# Patient Record
Sex: Female | Born: 1960 | Race: White | Hispanic: No | Marital: Single
Health system: Southern US, Community
[De-identification: ages and names within clinical notes are randomized; demographics above are authoritative.]

## PROBLEM LIST (undated history)

## (undated) DIAGNOSIS — U071 COVID-19: Secondary | ICD-10-CM

## (undated) DIAGNOSIS — K922 Gastrointestinal hemorrhage, unspecified: Secondary | ICD-10-CM

## (undated) DIAGNOSIS — I4891 Unspecified atrial fibrillation: Secondary | ICD-10-CM

## (undated) DIAGNOSIS — I639 Cerebral infarction, unspecified: Secondary | ICD-10-CM

## (undated) DIAGNOSIS — I2510 Atherosclerotic heart disease of native coronary artery without angina pectoris: Secondary | ICD-10-CM

## (undated) DIAGNOSIS — Z931 Gastrostomy status: Secondary | ICD-10-CM

## (undated) DIAGNOSIS — D649 Anemia, unspecified: Secondary | ICD-10-CM

## (undated) DIAGNOSIS — H547 Unspecified visual loss: Secondary | ICD-10-CM

## (undated) DIAGNOSIS — I69391 Dysphagia following cerebral infarction: Secondary | ICD-10-CM

## (undated) DIAGNOSIS — Z7901 Long term (current) use of anticoagulants: Secondary | ICD-10-CM

---

## 2021-04-27 ENCOUNTER — Other Ambulatory Visit: Payer: Self-pay

## 2021-04-27 ENCOUNTER — Observation Stay (HOSPITAL_COMMUNITY): Payer: Medicaid Other

## 2021-04-27 ENCOUNTER — Inpatient Hospital Stay (HOSPITAL_COMMUNITY)
Admission: EM | Admit: 2021-04-27 | Discharge: 2021-05-02 | DRG: 871 | Disposition: A | Discharge status: Home Health | Payer: Medicaid Other | Source: Skilled Nursing Facility | Attending: Family Medicine | Admitting: Family Medicine

## 2021-04-27 ENCOUNTER — Encounter (HOSPITAL_COMMUNITY): Payer: Self-pay | Admitting: Emergency Medicine

## 2021-04-27 ENCOUNTER — Emergency Department (HOSPITAL_COMMUNITY): Payer: Medicaid Other

## 2021-04-27 DIAGNOSIS — Z7982 Long term (current) use of aspirin: Secondary | ICD-10-CM

## 2021-04-27 DIAGNOSIS — Z79899 Other long term (current) drug therapy: Secondary | ICD-10-CM

## 2021-04-27 DIAGNOSIS — Z931 Gastrostomy status: Secondary | ICD-10-CM

## 2021-04-27 DIAGNOSIS — Z8249 Family history of ischemic heart disease and other diseases of the circulatory system: Secondary | ICD-10-CM

## 2021-04-27 DIAGNOSIS — D62 Acute posthemorrhagic anemia: Secondary | ICD-10-CM | POA: Diagnosis present

## 2021-04-27 DIAGNOSIS — R531 Weakness: Secondary | ICD-10-CM

## 2021-04-27 DIAGNOSIS — H547 Unspecified visual loss: Secondary | ICD-10-CM | POA: Diagnosis present

## 2021-04-27 DIAGNOSIS — I63413 Cerebral infarction due to embolism of bilateral middle cerebral arteries: Secondary | ICD-10-CM

## 2021-04-27 DIAGNOSIS — G9349 Other encephalopathy: Secondary | ICD-10-CM | POA: Diagnosis present

## 2021-04-27 DIAGNOSIS — E785 Hyperlipidemia, unspecified: Secondary | ICD-10-CM | POA: Diagnosis present

## 2021-04-27 DIAGNOSIS — Z8673 Personal history of transient ischemic attack (TIA), and cerebral infarction without residual deficits: Secondary | ICD-10-CM

## 2021-04-27 DIAGNOSIS — E870 Hyperosmolality and hypernatremia: Secondary | ICD-10-CM | POA: Diagnosis present

## 2021-04-27 DIAGNOSIS — I2510 Atherosclerotic heart disease of native coronary artery without angina pectoris: Secondary | ICD-10-CM | POA: Diagnosis present

## 2021-04-27 DIAGNOSIS — U071 COVID-19: Secondary | ICD-10-CM

## 2021-04-27 DIAGNOSIS — I4821 Permanent atrial fibrillation: Secondary | ICD-10-CM | POA: Diagnosis present

## 2021-04-27 DIAGNOSIS — R509 Fever, unspecified: Secondary | ICD-10-CM | POA: Diagnosis present

## 2021-04-27 DIAGNOSIS — I6381 Other cerebral infarction due to occlusion or stenosis of small artery: Secondary | ICD-10-CM | POA: Diagnosis present

## 2021-04-27 DIAGNOSIS — J9601 Acute respiratory failure with hypoxia: Secondary | ICD-10-CM | POA: Diagnosis present

## 2021-04-27 DIAGNOSIS — Z0189 Encounter for other specified special examinations: Secondary | ICD-10-CM

## 2021-04-27 DIAGNOSIS — D649 Anemia, unspecified: Secondary | ICD-10-CM

## 2021-04-27 DIAGNOSIS — R195 Other fecal abnormalities: Secondary | ICD-10-CM

## 2021-04-27 DIAGNOSIS — E860 Dehydration: Secondary | ICD-10-CM | POA: Diagnosis present

## 2021-04-27 DIAGNOSIS — K5900 Constipation, unspecified: Secondary | ICD-10-CM | POA: Diagnosis present

## 2021-04-27 DIAGNOSIS — Z951 Presence of aortocoronary bypass graft: Secondary | ICD-10-CM

## 2021-04-27 DIAGNOSIS — Z66 Do not resuscitate: Secondary | ICD-10-CM | POA: Diagnosis present

## 2021-04-27 DIAGNOSIS — Z7901 Long term (current) use of anticoagulants: Secondary | ICD-10-CM

## 2021-04-27 DIAGNOSIS — I639 Cerebral infarction, unspecified: Secondary | ICD-10-CM

## 2021-04-27 DIAGNOSIS — A4189 Other specified sepsis: Principal | ICD-10-CM | POA: Diagnosis present

## 2021-04-27 DIAGNOSIS — K922 Gastrointestinal hemorrhage, unspecified: Secondary | ICD-10-CM

## 2021-04-27 DIAGNOSIS — I69391 Dysphagia following cerebral infarction: Secondary | ICD-10-CM

## 2021-04-27 DIAGNOSIS — Z1389 Encounter for screening for other disorder: Secondary | ICD-10-CM

## 2021-04-27 DIAGNOSIS — I10 Essential (primary) hypertension: Secondary | ICD-10-CM | POA: Diagnosis present

## 2021-04-27 DIAGNOSIS — E440 Moderate protein-calorie malnutrition: Secondary | ICD-10-CM

## 2021-04-27 DIAGNOSIS — R1310 Dysphagia, unspecified: Secondary | ICD-10-CM | POA: Diagnosis present

## 2021-04-27 DIAGNOSIS — R471 Dysarthria and anarthria: Secondary | ICD-10-CM | POA: Diagnosis present

## 2021-04-27 DIAGNOSIS — R5381 Other malaise: Secondary | ICD-10-CM | POA: Diagnosis present

## 2021-04-27 HISTORY — DX: Cerebral infarction, unspecified: I63.9

## 2021-04-27 HISTORY — DX: Unspecified atrial fibrillation: I48.91

## 2021-04-27 LAB — CBC WITH DIFFERENTIAL/PLATELET
Abs Immature Granulocytes: 0.11 10*3/uL — ABNORMAL HIGH (ref 0.00–0.07)
Basophils Absolute: 0 10*3/uL (ref 0.0–0.1)
Basophils Relative: 0 %
Eosinophils Absolute: 0 10*3/uL (ref 0.0–0.5)
Eosinophils Relative: 0 %
HCT: 24.8 % — ABNORMAL LOW (ref 36.0–46.0)
Hemoglobin: 7.6 g/dL — ABNORMAL LOW (ref 12.0–15.0)
Immature Granulocytes: 1 %
Lymphocytes Relative: 9 %
Lymphs Abs: 0.9 10*3/uL (ref 0.7–4.0)
MCH: 28 pg (ref 26.0–34.0)
MCHC: 30.6 g/dL (ref 30.0–36.0)
MCV: 91.5 fL (ref 80.0–100.0)
Monocytes Absolute: 1.3 10*3/uL — ABNORMAL HIGH (ref 0.1–1.0)
Monocytes Relative: 12 %
Neutro Abs: 8.2 10*3/uL — ABNORMAL HIGH (ref 1.7–7.7)
Neutrophils Relative %: 78 %
Platelets: 230 10*3/uL (ref 150–400)
RBC: 2.71 MIL/uL — ABNORMAL LOW (ref 3.87–5.11)
RDW: 16.6 % — ABNORMAL HIGH (ref 11.5–15.5)
WBC: 10.6 10*3/uL — ABNORMAL HIGH (ref 4.0–10.5)
nRBC: 0.5 % — ABNORMAL HIGH (ref 0.0–0.2)

## 2021-04-27 LAB — URINALYSIS, MICROSCOPIC (REFLEX)

## 2021-04-27 LAB — FIBRINOGEN: Fibrinogen: 468 mg/dL (ref 210–475)

## 2021-04-27 LAB — COMPREHENSIVE METABOLIC PANEL
ALT: 22 U/L (ref 0–44)
AST: 29 U/L (ref 15–41)
Albumin: 3.2 g/dL — ABNORMAL LOW (ref 3.5–5.0)
Alkaline Phosphatase: 70 U/L (ref 38–126)
Anion gap: 10 (ref 5–15)
BUN: 32 mg/dL — ABNORMAL HIGH (ref 8–23)
CO2: 25 mmol/L (ref 22–32)
Calcium: 9.6 mg/dL (ref 8.9–10.3)
Chloride: 111 mmol/L (ref 98–111)
Creatinine, Ser: 0.96 mg/dL (ref 0.44–1.00)
GFR, Estimated: >60 mL/min (ref >60)
Glucose, Bld: 131 mg/dL — ABNORMAL HIGH (ref 70–99)
Potassium: 4.3 mmol/L (ref 3.5–5.1)
Sodium: 146 mmol/L — ABNORMAL HIGH (ref 135–145)
Total Bilirubin: 0.7 mg/dL (ref 0.3–1.2)
Total Protein: 6.9 g/dL (ref 6.5–8.1)

## 2021-04-27 LAB — URINALYSIS, ROUTINE W REFLEX MICROSCOPIC
Bilirubin Urine: NEGATIVE
Glucose, UA: NEGATIVE mg/dL
Ketones, ur: NEGATIVE mg/dL
Leukocytes,Ua: NEGATIVE
Nitrite: NEGATIVE
Protein, ur: 100 mg/dL — AB
Specific Gravity, Urine: 1.02 (ref 1.005–1.030)
pH: 7 (ref 5.0–8.0)

## 2021-04-27 LAB — FOLATE: Folate: 30.5 ng/mL (ref >5.9)

## 2021-04-27 LAB — IRON AND TIBC
Iron: 30 ug/dL (ref 28–170)
Saturation Ratios: 8 % — ABNORMAL LOW (ref 10.4–31.8)
TIBC: 371 ug/dL (ref 250–450)
UIBC: 341 ug/dL

## 2021-04-27 LAB — HIV ANTIBODY (ROUTINE TESTING W REFLEX): HIV Screen 4th Generation wRfx: NONREACTIVE

## 2021-04-27 LAB — LACTATE DEHYDROGENASE: LDH: 290 U/L — ABNORMAL HIGH (ref 98–192)

## 2021-04-27 LAB — PROTIME-INR
INR: 1.1 (ref 0.8–1.2)
Prothrombin Time: 14.2 seconds (ref 11.4–15.2)

## 2021-04-27 LAB — POC OCCULT BLOOD, ED: Fecal Occult Bld: POSITIVE — AB

## 2021-04-27 LAB — VITAMIN B12: Vitamin B-12: 271 pg/mL (ref 180–914)

## 2021-04-27 LAB — RETICULOCYTES
Immature Retic Fract: 33.1 % — ABNORMAL HIGH (ref 2.3–15.9)
RBC.: 2.66 MIL/uL — ABNORMAL LOW (ref 3.87–5.11)
Retic Count, Absolute: 151.9 10*3/uL (ref 19.0–186.0)
Retic Ct Pct: 5.7 % — ABNORMAL HIGH (ref 0.4–3.1)

## 2021-04-27 LAB — RESP PANEL BY RT-PCR (FLU A&B, COVID) ARPGX2
Influenza A by PCR: NEGATIVE
Influenza B by PCR: NEGATIVE
SARS Coronavirus 2 by RT PCR: POSITIVE — AB

## 2021-04-27 LAB — PROCALCITONIN: Procalcitonin: <0.1 ng/mL

## 2021-04-27 LAB — FERRITIN: Ferritin: 18 ng/mL (ref 11–307)

## 2021-04-27 LAB — HEMOGLOBIN AND HEMATOCRIT, BLOOD
HCT: 21.8 % — ABNORMAL LOW (ref 36.0–46.0)
Hemoglobin: 6.6 g/dL — CL (ref 12.0–15.0)

## 2021-04-27 LAB — C-REACTIVE PROTEIN: CRP: 0.6 mg/dL (ref <1.0)

## 2021-04-27 LAB — LACTIC ACID, PLASMA: Lactic Acid, Venous: 1.3 mmol/L (ref 0.5–1.9)

## 2021-04-27 LAB — ABO/RH: ABO/RH(D): O POS

## 2021-04-27 LAB — PREPARE RBC (CROSSMATCH)

## 2021-04-27 IMAGING — CT CT HEAD W/O CM
3 of 5 series · 14 of 47 positions shown, 16 images · non-contrast
Comparison: None.

CLINICAL DATA: Altered mental status

EXAM:
CT HEAD WITHOUT CONTRAST
TECHNIQUE: Contiguous axial images were obtained from the base of the skull
through the vertex without intravenous contrast.

[Series 3: head without · axial · non-contrast · 0.44mm/px · z∈[-139,-79]mm · 3 of 32 slices shown]
[im 7/32  brain]
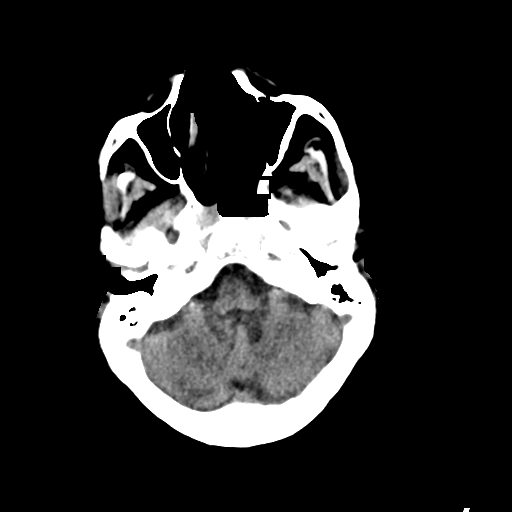
[im 13/32  brain]
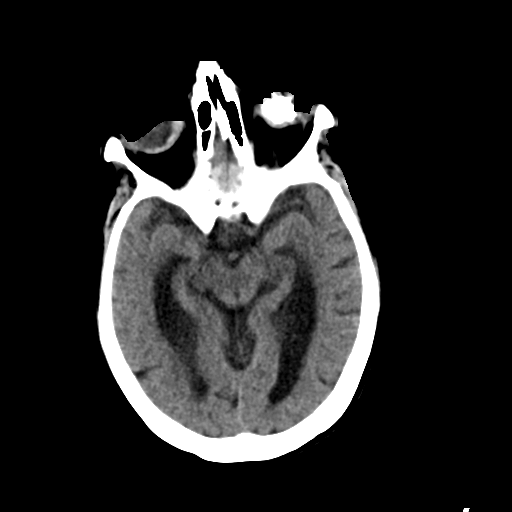
[im 19/32  brain]
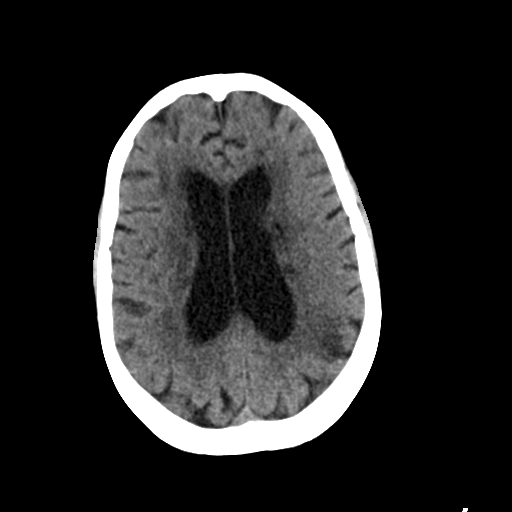

[Series 5: head without cor · coronal · non-contrast · 0.36mm/px · 3 of 73 slices shown]
[im 27/73  brain]
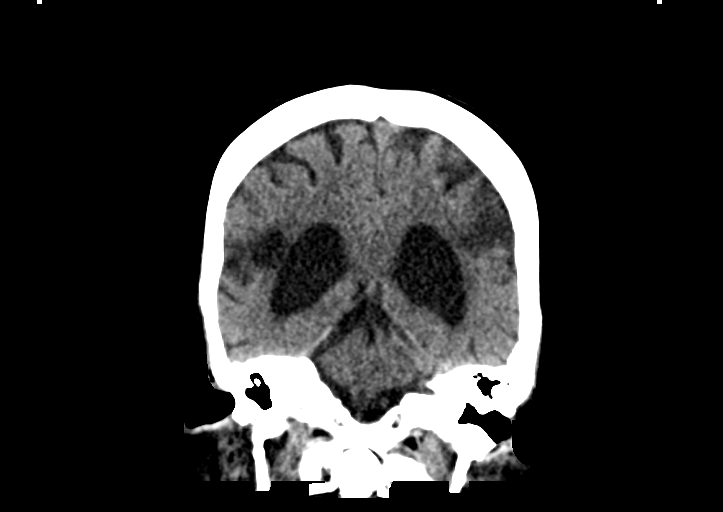
[im 33/73  brain]
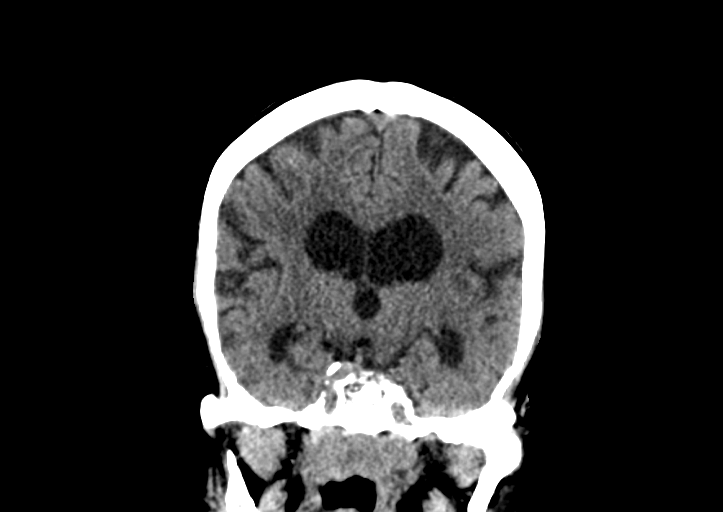
[im 39/73  brain]
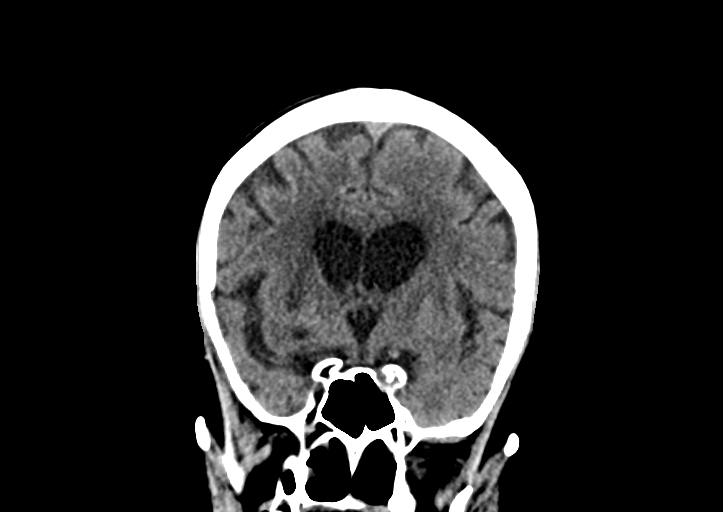

[Series 7: head without ax · axial · non-contrast · 0.39mm/px · z∈[-78,+40]mm · 8 of 59 slices shown, 10 images]
[im 7/59  brain]
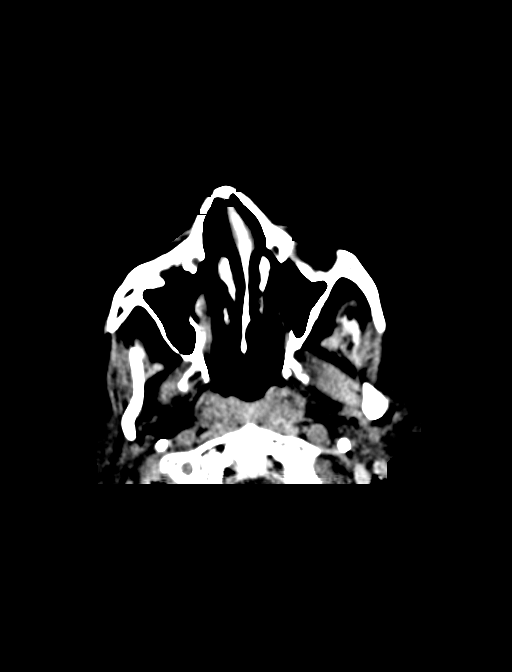
[im 7/59  bone]
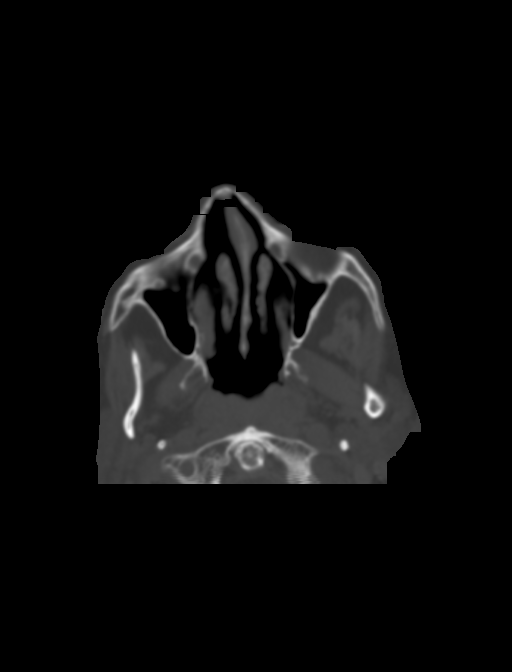
[im 13/59  brain]
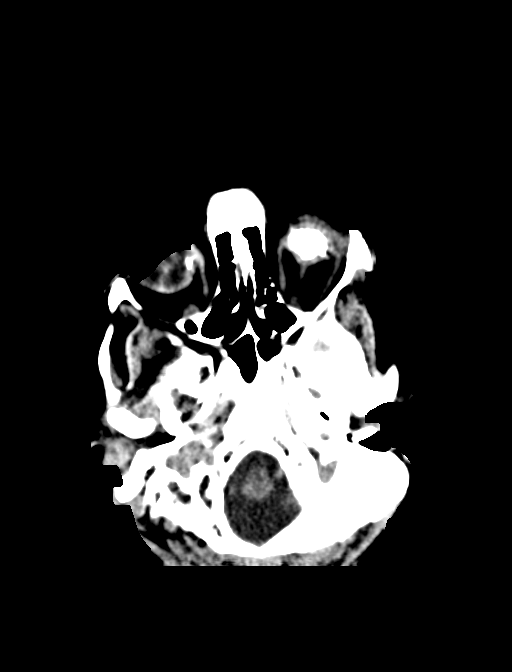
[im 20/59  brain]
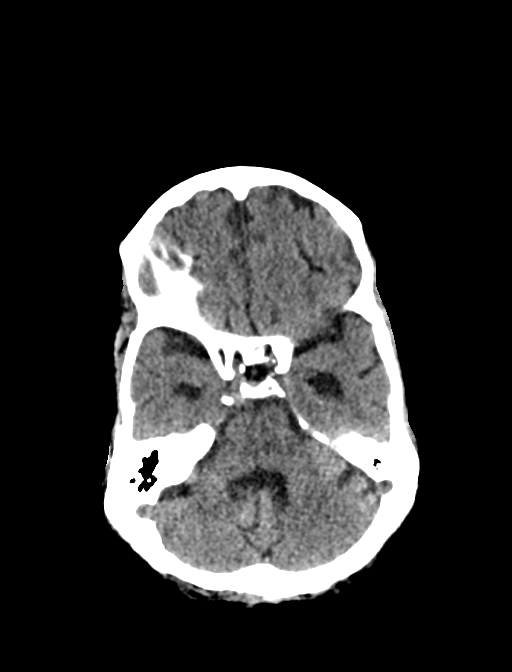
[im 26/59  brain]
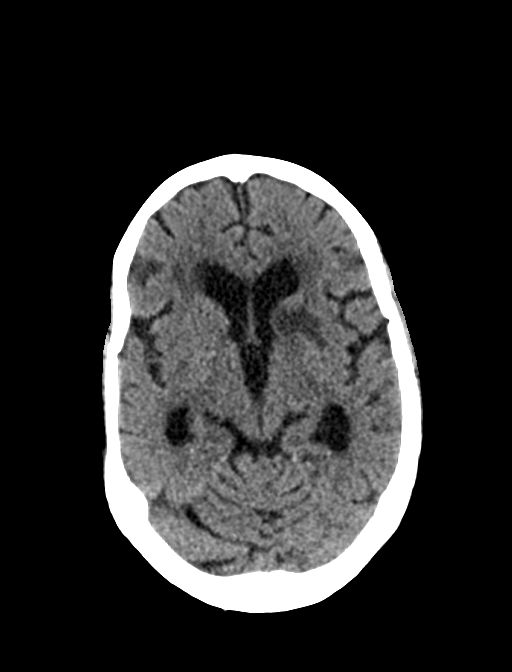
[im 33/59  brain]
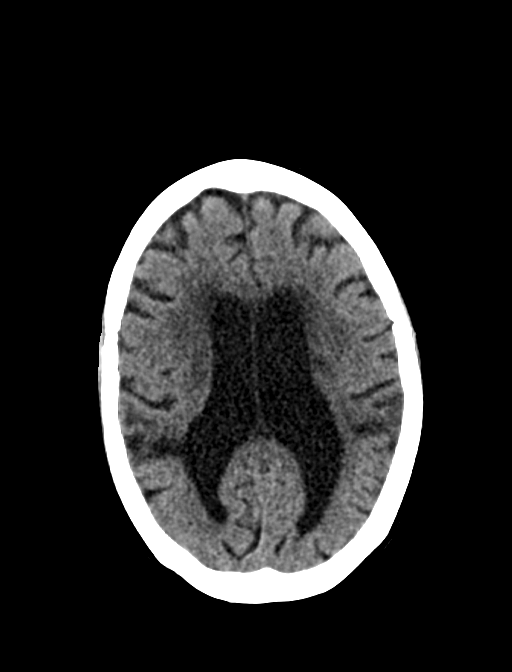
[im 33/59  bone]
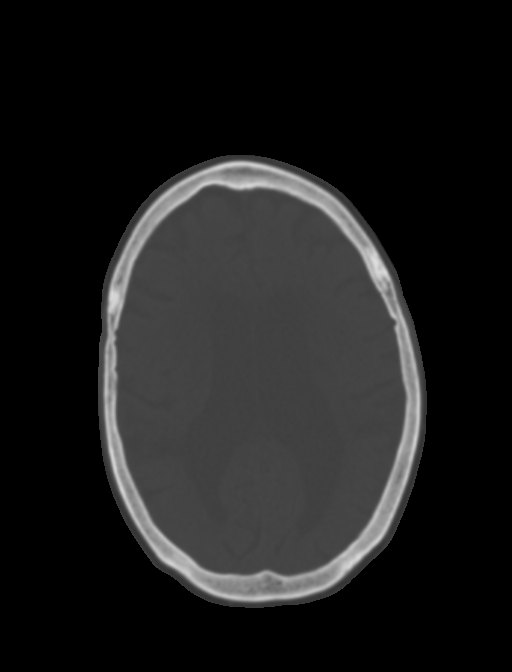
[im 39/59  brain]
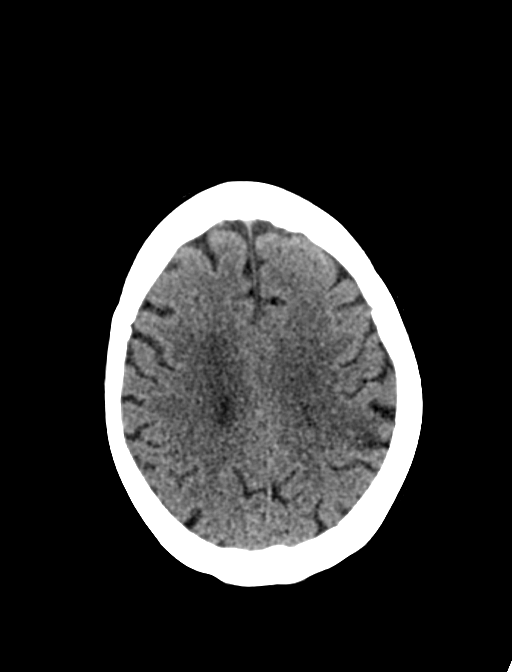
[im 46/59  brain]
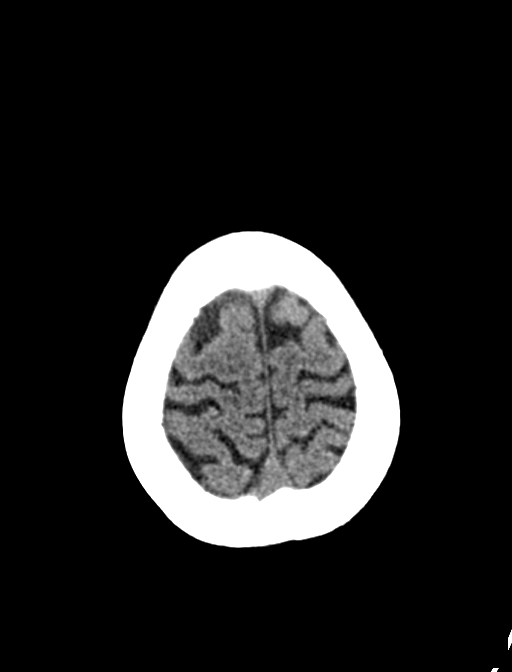
[im 52/59  brain]
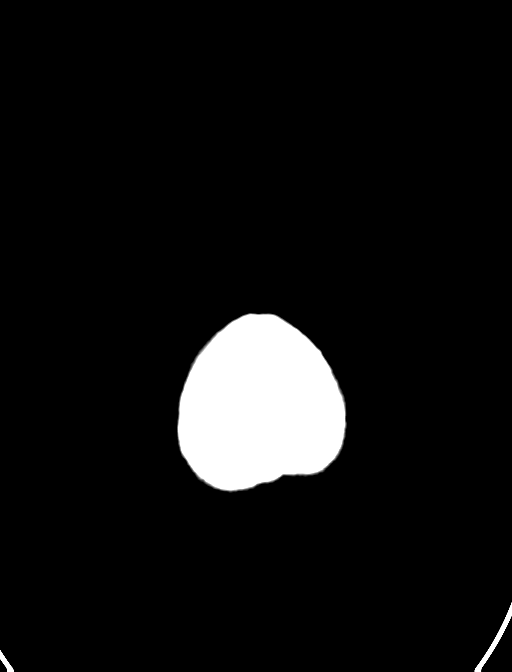

[14 of 47 positions shown; findings below may reference images not displayed]

FINDINGS: Brain: No hemorrhage or intracranial mass. Atrophy and chronic small
vessel ischemic changes of the white matter. Chronic appearing
bilateral parietal and right temporal infarcts. Small chronic
appearing infarcts within the bilateral basal ganglia. Chronic
lacunar infarct in the right thalamus. Hypodensity within the left
basal ganglia consistent with more acute or subacute infarct.

Vascular: No hyperdense vessels.  Carotid vascular calcification

Skull: No fracture

Sinuses/Orbits: No acute finding. This is bulbi on the left.
Multiple coarse right globe calcifications.

Other: None
IMPRESSION: 1. Hypodensity within the left basal ganglia, suspicious for acute
to subacute infarct. There is no hemorrhage.
2. Multiple chronic appearing infarcts within the bilateral basal
ganglia, right thalamus, parietal lobes and right temporal lobe.
3. Atrophy and mild chronic small vessel ischemic changes of the
white matter.

## 2021-04-27 IMAGING — DX DG CHEST 1V PORT
1 series · 1 of 1 positions shown · non-contrast
Comparison: None.

CLINICAL DATA: Sepsis.

EXAM:
PORTABLE CHEST 1 VIEW

[chest ap]
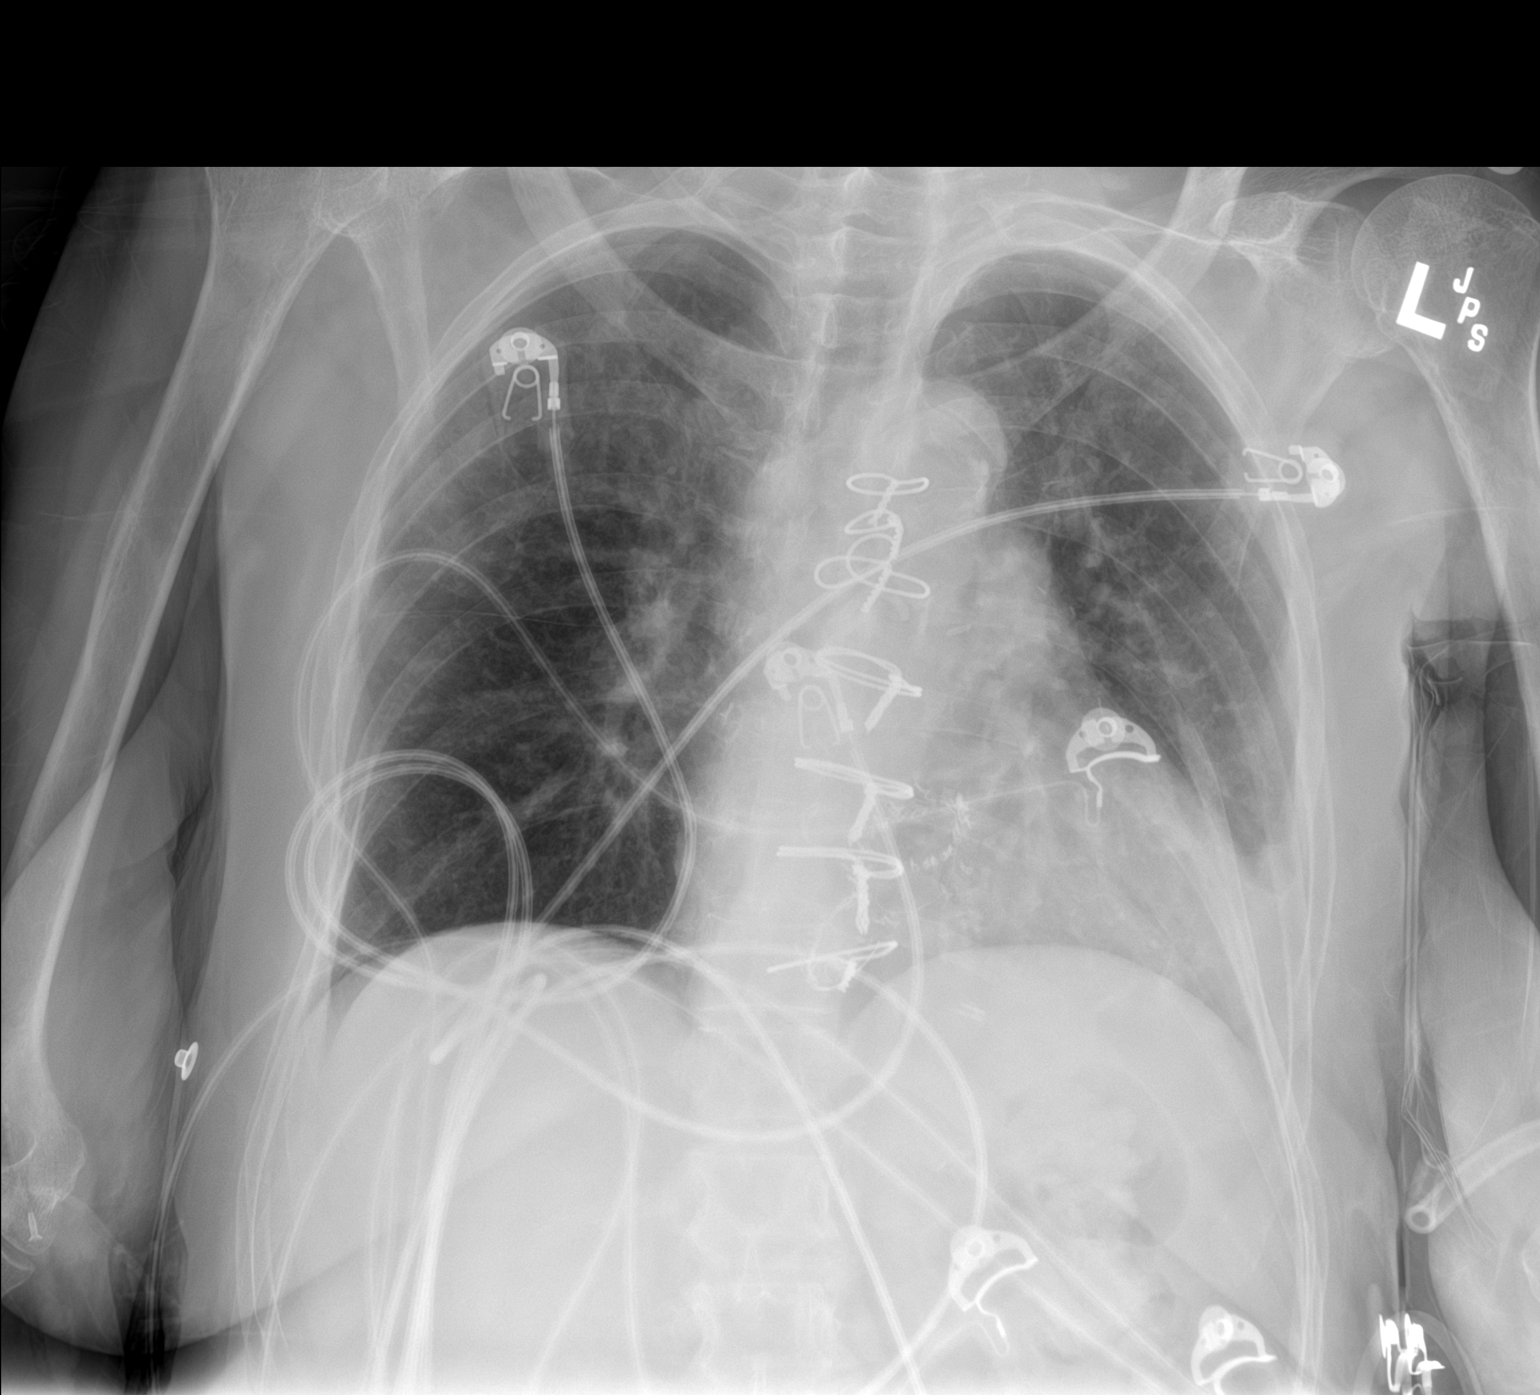

[1 of 1 positions shown; findings below may reference images not displayed]

FINDINGS: Previous median sternotomy and aortic valve repair heart size
appears normal. No pleural effusion or interstitial edema. No
airspace opacities identified. The visualized osseous structures are
unremarkable.
IMPRESSION: No acute cardiopulmonary abnormalities.

## 2021-04-27 MED ORDER — ATORVASTATIN CALCIUM 80 MG PO TABS
80.0000 mg | ORAL_TABLET | Freq: Every day | ORAL | Status: DC
  Administered 2021-04-28 – 2021-05-02 (×5): 80 mg
  Filled 2021-04-27: qty 1
  Filled 2021-04-27: qty 2
  Filled 2021-04-27 (×3): qty 1

## 2021-04-27 MED ORDER — ATORVASTATIN CALCIUM 40 MG PO TABS
80.0000 mg | ORAL_TABLET | Freq: Every day | ORAL | Status: DC
  Administered 2021-04-27: 80 mg via ORAL

## 2021-04-27 MED ORDER — VANCOMYCIN HCL 1000 MG/200ML IV SOLN
1000.0000 mg | Freq: Once | INTRAVENOUS | Status: AC
  Administered 2021-04-27: 1000 mg via INTRAVENOUS
  Filled 2021-04-27: qty 200

## 2021-04-27 MED ORDER — CARVEDILOL 25 MG PO TABS
25.0000 mg | ORAL_TABLET | Freq: Two times a day (BID) | ORAL | Status: DC
  Administered 2021-04-27 – 2021-05-02 (×10): 25 mg
  Filled 2021-04-27: qty 1
  Filled 2021-04-27: qty 2
  Filled 2021-04-27: qty 1
  Filled 2021-04-27: qty 2
  Filled 2021-04-27 (×6): qty 1

## 2021-04-27 MED ORDER — LACTATED RINGERS IV BOLUS (SEPSIS)
1000.0000 mL | Freq: Once | INTRAVENOUS | Status: AC
  Administered 2021-04-27: 1000 mL via INTRAVENOUS

## 2021-04-27 MED ORDER — POLYETHYLENE GLYCOL 3350 17 G PO PACK
17.0000 g | PACK | Freq: Every day | ORAL | Status: DC
Start: 2021-04-28 — End: 2021-04-28

## 2021-04-27 MED ORDER — PANTOPRAZOLE 80MG IVPB - SIMPLE MED
80.0000 mg | Freq: Once | INTRAVENOUS | Status: AC
  Administered 2021-04-27: 80 mg via INTRAVENOUS
  Filled 2021-04-27: qty 80

## 2021-04-27 MED ORDER — ACETAMINOPHEN 500 MG PO TABS
500.0000 mg | ORAL_TABLET | ORAL | Status: DC | PRN
  Administered 2021-04-27: 500 mg via ORAL

## 2021-04-27 MED ORDER — SODIUM CHLORIDE 0.9 % IV SOLN
200.0000 mg | Freq: Once | INTRAVENOUS | Status: AC
  Administered 2021-04-27: 200 mg via INTRAVENOUS
  Filled 2021-04-27: qty 40

## 2021-04-27 MED ORDER — SODIUM CHLORIDE 0.9% IV SOLUTION: Freq: Once | INTRAVENOUS | Status: AC

## 2021-04-27 MED ORDER — SODIUM CHLORIDE 0.9 % IV SOLN
2.0000 g | Freq: Once | INTRAVENOUS | Status: AC
  Administered 2021-04-27: 2 g via INTRAVENOUS

## 2021-04-27 MED ORDER — ACETAMINOPHEN 500 MG PO TABS: 500.0000 mg | ORAL_TABLET | ORAL | Status: DC | PRN

## 2021-04-27 MED ORDER — LACTATED RINGERS IV SOLN: INTRAVENOUS | Status: DC

## 2021-04-27 MED ORDER — PANTOPRAZOLE INFUSION (NEW) - SIMPLE MED
8.0000 mg/h | INTRAVENOUS | Status: DC
  Administered 2021-04-27 – 2021-04-29 (×3): 8 mg/h via INTRAVENOUS
  Filled 2021-04-27 (×2): qty 80
  Filled 2021-04-27: qty 100
  Filled 2021-04-27: qty 80

## 2021-04-27 MED ORDER — DEXAMETHASONE SODIUM PHOSPHATE 10 MG/ML IJ SOLN
6.0000 mg | INTRAMUSCULAR | Status: DC
  Administered 2021-04-27 – 2021-04-29 (×3): 6 mg via INTRAVENOUS
  Filled 2021-04-27 (×2): qty 1

## 2021-04-27 MED ORDER — CARVEDILOL 12.5 MG PO TABS: 25.0000 mg | ORAL_TABLET | Freq: Two times a day (BID) | ORAL | Status: DC

## 2021-04-27 MED ORDER — SENNOSIDES-DOCUSATE SODIUM 8.6-50 MG PO TABS
1.0000 | ORAL_TABLET | Freq: Every day | ORAL | Status: DC
  Administered 2021-04-28 – 2021-05-01 (×4): 1
  Filled 2021-04-27 (×4): qty 1

## 2021-04-27 MED ORDER — LACTATED RINGERS IV BOLUS (SEPSIS)
500.0000 mL | Freq: Once | INTRAVENOUS | Status: AC
  Administered 2021-04-27: 500 mL via INTRAVENOUS

## 2021-04-27 MED ORDER — AMLODIPINE BESYLATE 5 MG PO TABS
5.0000 mg | ORAL_TABLET | Freq: Every day | ORAL | Status: DC
  Administered 2021-04-28 – 2021-05-01 (×4): 5 mg
  Filled 2021-04-27 (×4): qty 1

## 2021-04-27 MED ORDER — SODIUM CHLORIDE 0.9 % IV SOLN
2.0000 g | Freq: Two times a day (BID) | INTRAVENOUS | Status: DC
  Administered 2021-04-28: 2 g via INTRAVENOUS
  Filled 2021-04-27: qty 2

## 2021-04-27 MED ORDER — SENNOSIDES-DOCUSATE SODIUM 8.6-50 MG PO TABS
1.0000 | ORAL_TABLET | Freq: Every day | ORAL | Status: DC
  Administered 2021-04-27: 1 via ORAL

## 2021-04-27 MED ORDER — CHLORHEXIDINE GLUCONATE 0.12 % MT SOLN
15.0000 mL | Freq: Two times a day (BID) | OROMUCOSAL | Status: DC
  Administered 2021-04-28 – 2021-05-02 (×9): 15 mL via OROMUCOSAL
  Filled 2021-04-27 (×11): qty 15

## 2021-04-27 MED ORDER — ORAL CARE MOUTH RINSE
15.0000 mL | Freq: Two times a day (BID) | OROMUCOSAL | Status: DC
  Administered 2021-04-28 – 2021-05-02 (×10): 15 mL via OROMUCOSAL

## 2021-04-27 MED ORDER — POLYETHYLENE GLYCOL 3350 17 G PO PACK: 17.0000 g | PACK | Freq: Every day | ORAL | Status: DC

## 2021-04-27 MED ORDER — AMLODIPINE BESYLATE 5 MG PO TABS: 5.0000 mg | ORAL_TABLET | Freq: Every day | ORAL | Status: DC

## 2021-04-27 MED ORDER — SODIUM CHLORIDE 0.9 % IV SOLN
100.0000 mg | Freq: Every day | INTRAVENOUS | Status: AC
  Administered 2021-04-28 – 2021-05-01 (×4): 100 mg via INTRAVENOUS
  Filled 2021-04-27 (×4): qty 20

## 2021-04-27 MED ORDER — VANCOMYCIN HCL 750 MG/150ML IV SOLN
750.0000 mg | INTRAVENOUS | Status: DC
  Filled 2021-04-27: qty 150

## 2021-04-27 NOTE — Progress Notes (Signed)
Family medicine teaching service will be admitting this patient. Our pager information can be located in the physician sticky notes, treatment team sticky notes, and the headers of all our official daily progress notes.   FAMILY MEDICINE TEACHING SERVICE Patient - Please contact intern pager [phone] or text page via website [email or web address] (login: [email or web address]) for questions regarding care. DO NOT page listed attending provider unless there is no answer from the number above.   Jill Faciane, MD PGY-2, [facility] pager [phone]   

## 2021-04-27 NOTE — Progress Notes (Signed)
FPTS Brief Progress Note  S: In the see patient. She was non-verbal. She would occasionally follow commands. She did not appear in distress. B/l mittens were on to both hands.  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  Resp: normal work of breathing, CTAB, saturating 99% on room air Cardio: irregularly irregular, HR 70s  Neuro: non-verbal. Congenitally blind. Able to move all extremities spontaneously without signs of obvious hemiparesis.   A/P: AMS  Sepsis  COVID + Appears to have had no change in mental status since admission. She is non-verbal but will follow certain commands such as "raise your arms" and "lift your legs". Will continue treatment with Remdesivir, decadron and abx due to concern for sepsis. Additionally, CT Head returned with hypodensity within the left basal ganglia, suspicious for acute to subacute infarct without hemorrhage seen. D/w Dr. with Neurology who recommends obtaining an MRI brain for further evaluation. If positive, will re-consult Neurology for additional stroke work up.  -F/u MRI brain   Normocytic Anemia, concern for GIB FOBT+. On PPI and GI is on board. H&H was 6.6 or she was ordered for 2U of PRBC.  -Monitor for hemodynamic instability -F/u repeat post-transfusion H&H -GI has been consulted, appreciate recommendations  Additional plan per H&H  - Orders reviewed. Labs for AM ordered, which was adjusted as needed.   Jerrell Belfast, DO 04/27/2021, 11:34 PM PGY-2, [facility] Night Resident  Please page [phone] with questions.

## 2021-04-27 NOTE — Progress Notes (Addendum)
Called patient's contact in chart, Jill Buchanan at [phone].   Daughter reports the nursing home did call her and report her mom had a fever and was coming to the hospital. Has been in this SNF about 1-2 weeks.   PMHx: A.fib on Xarelto, 5 strokes one month ago, dysphagia 2/2 CVA w/ current PEG tube, CVD s/p CABG, hx "mini-strokes", congenital blindness  OB hx: six children, all vaginally delivered  Allergies: NKDA, NKFA Surgical hx: CABG, PEG tube placement Fam hx: diabetes, heart disease  Daughter lives in [place], outside of [place]. Daughter reports she is her mother's primary next-of-kin.   Other contacts: Sister Sedonia Small [phone], lives in [place]  CODE STATUS, confirmed with daughter: FULL Per daughter, blood transfusion is acceptable if needed.    Fayette Pho, MD PGY-2, [facility] Service pager [phone]

## 2021-04-27 NOTE — ED Notes (Signed)
Mittens on pt at this time

## 2021-04-27 NOTE — ED Provider Notes (Signed)
[facility] EMERGENCY DEPARTMENT Provider Note   CSN: [id number] Arrival date & time: 04/27/21  1117     History Chief Complaint  Patient presents with   Altered Mental Status   Fever   Weakness    Jill Buchanan is a 60 y.o. female.  Pt presents via EMS w fever and concern for sepsis. Patient from SNF, at baseline is blind, not verbally communicative, hx cva, PEG tube - pt noted with fever today to 102, and decreased responsiveness, generally weak appearing. Pt is not currently verbally responsive to questions - level 5 caveat. No report of trauma, fall or injury.   The history is provided by the patient, medical records, the nursing home and the EMS personnel. The history is limited by the condition of the patient.  Altered Mental Status Associated symptoms: fever       Past Medical History:  Diagnosis Date   A-fib (HCC)    CVA (cerebral vascular accident) (HCC)     There are no problems to display for this patient.   History reviewed. No pertinent surgical history.   OB History   No obstetric history on file.     History reviewed. No pertinent family history.     Home Medications Prior to Admission medications   Medication Sig Start Date End Date Taking? Authorizing Provider  acetaminophen (TYLENOL) 500 MG tablet Take 500 mg by mouth every 4 (four) hours as needed for mild pain.   Yes [provider]  amLODipine (NORVASC) 10 MG tablet Take 10 mg by mouth daily. 04/16/21  Yes [provider]  aspirin EC 81 MG tablet Take 81 mg by mouth daily.   Yes [provider]  atorvastatin (LIPITOR) 80 MG tablet Take 80 mg by mouth daily. 04/16/21  Yes [provider]  carvedilol (COREG) 25 MG tablet Take 25 mg by mouth 2 (two) times daily. 04/16/21  Yes [provider]  ELIQUIS 5 MG TABS tablet Take 5 mg by mouth 2 (two) times daily. 04/16/21  Yes [provider]  Multiple Vitamins-Minerals  (MULTIVITAMIN WITH MINERALS) tablet Take 1 tablet by mouth daily.   Yes [provider]  polyethylene glycol (MIRALAX / GLYCOLAX) 17 g packet Take 17 g by mouth daily.   Yes [provider]  sennosides-docusate sodium (SENOKOT-S) 8.6-50 MG tablet Take 1 tablet by mouth daily.   Yes [provider]    Allergies    Eggs or egg-derived products  Review of Systems   Review of Systems  Unable to perform ROS: Mental status change  Constitutional:  Positive for fever.  Level 5 caveat, altered mental status   Physical Exam Updated Vital Signs BP (!) 143/68   Pulse 85   Temp (!) 100.4 F (38 C) (Rectal)   Resp (!) 21   Ht 1.6 m (5\' 3" )   Wt 49.9 kg   SpO2 100%   BMI 19.49 kg/m   Physical Exam Vitals and nursing note reviewed.  Constitutional:      Appearance: Normal appearance. She is well-developed.  HENT:     Head: Atraumatic.     Nose: Nose normal.     Mouth/Throat:     Mouth: Mucous membranes are moist.  Eyes:     General: No scleral icterus.    Comments: Eyes w chronic changes/blindness c/w baseline.   Neck:     Vascular: No carotid bruit.     Trachea: No tracheal deviation.     Comments: No stiffness/rigidity.  Cardiovascular:     Rate and Rhythm: Normal rate and regular rhythm.     Pulses: Normal pulses.     Heart sounds: Normal heart sounds. No murmur heard.   No friction rub. No gallop.  Pulmonary:     Effort: Pulmonary effort is normal. No respiratory distress.     Breath sounds: Normal breath sounds.  Abdominal:     General: Bowel sounds are normal. There is no distension.     Palpations: Abdomen is soft. There is no mass.     Tenderness: There is no abdominal tenderness. There is no guarding.     Comments: G tube site without obvious sign of infection at site.   Genitourinary:    Comments: No cva tenderness. Very dark brown stool, strongly heme positive.  Musculoskeletal:        General: No swelling.     Cervical back: Normal  range of motion and neck supple. No rigidity. No muscular tenderness.  Skin:    General: Skin is warm and dry.     Findings: No rash.  Neurological:     Mental Status: She is alert.     Comments: Sitting upright. Not verbally responsive to questions (reported as c/w baseline). Moves extremities, does not follow commands.   Psychiatric:     Comments: Decreased responsiveness.     ED Results / Procedures / Treatments   Labs (all labs ordered are listed, but only abnormal results are displayed) Results for orders placed or performed during the hospital encounter of 04/27/21  Resp Panel by RT-PCR (Flu A&B, Covid) Nasopharyngeal Swab   Specimen: Nasopharyngeal Swab; Nasopharyngeal(NP) swabs in vial transport medium  Result Value Ref Range   SARS Coronavirus 2 by RT PCR POSITIVE (A) NEGATIVE   Influenza A by PCR NEGATIVE NEGATIVE   Influenza B by PCR NEGATIVE NEGATIVE  Lactic acid, plasma  Result Value Ref Range   Lactic Acid, Venous 1.3 0.5 - 1.9 mmol/L  Comprehensive metabolic panel  Result Value Ref Range   Sodium 146 (H) 135 - 145 mmol/L   Potassium 4.3 3.5 - 5.1 mmol/L   Chloride 111 98 - 111 mmol/L   CO2 25 22 - 32 mmol/L   Glucose, Bld 131 (H) 70 - 99 mg/dL   BUN 32 (H) 8 - 23 mg/dL   Creatinine, Ser 3.29 0.44 - 1.00 mg/dL   Calcium 9.6 8.9 - 92.4 mg/dL   Total Protein 6.9 6.5 - 8.1 g/dL   Albumin 3.2 (L) 3.5 - 5.0 g/dL   AST 29 15 - 41 U/L   ALT 22 0 - 44 U/L   Alkaline Phosphatase 70 38 - 126 U/L   Total Bilirubin 0.7 0.3 - 1.2 mg/dL   GFR, Estimated >26 >83 mL/min   Anion gap 10 5 - 15  CBC WITH DIFFERENTIAL  Result Value Ref Range   WBC 10.6 (H) 4.0 - 10.5 K/uL   RBC 2.71 (L) 3.87 - 5.11 MIL/uL   Hemoglobin 7.6 (L) 12.0 - 15.0 g/dL   HCT 41.9 (L) 62.2 - 29.7 %   MCV 91.5 80.0 - 100.0 fL   MCH 28.0 26.0 - 34.0 pg   MCHC 30.6 30.0 - 36.0 g/dL   RDW 98.9 (H) 21.1 - 94.1 %   Platelets 230 150 - 400 K/uL   nRBC 0.5 (H) 0.0 - 0.2 %   Neutrophils Relative % 78 %    Neutro Abs 8.2 (H) 1.7 - 7.7 K/uL   Lymphocytes Relative 9 %  Lymphs Abs 0.9 0.7 - 4.0 K/uL   Monocytes Relative 12 %   Monocytes Absolute 1.3 (H) 0.1 - 1.0 K/uL   Eosinophils Relative 0 %   Eosinophils Absolute 0.0 0.0 - 0.5 K/uL   Basophils Relative 0 %   Basophils Absolute 0.0 0.0 - 0.1 K/uL   Immature Granulocytes 1 %   Abs Immature Granulocytes 0.11 (H) 0.00 - 0.07 K/uL  Protime-INR  Result Value Ref Range   Prothrombin Time 14.2 11.4 - 15.2 seconds   INR 1.1 0.8 - 1.2  Urinalysis, Routine w reflex microscopic Urine, Clean Catch  Result Value Ref Range   Color, Urine YELLOW YELLOW   APPearance CLEAR CLEAR   Specific Gravity, Urine 1.020 1.005 - 1.030   pH 7.0 5.0 - 8.0   Glucose, UA NEGATIVE NEGATIVE mg/dL   Hgb urine dipstick TRACE (A) NEGATIVE   Bilirubin Urine NEGATIVE NEGATIVE   Ketones, ur NEGATIVE NEGATIVE mg/dL   Protein, ur 161 (A) NEGATIVE mg/dL   Nitrite NEGATIVE NEGATIVE   Leukocytes,Ua NEGATIVE NEGATIVE  Urinalysis, Microscopic (reflex)  Result Value Ref Range   RBC / HPF 0-5 0 - 5 RBC/hpf   WBC, UA 0-5 0 - 5 WBC/hpf   Bacteria, UA RARE (A) NONE SEEN   Squamous Epithelial / LPF 0-5 0 - 5  POC occult blood, ED Provider will collect  Result Value Ref Range   Fecal Occult Bld POSITIVE (A) NEGATIVE   DG Chest Port 1 View  Result Date: 04/27/2021 CLINICAL DATA:  Sepsis. EXAM: PORTABLE CHEST 1 VIEW COMPARISON:  None. FINDINGS: Previous median sternotomy and aortic valve repair heart size appears normal. No pleural effusion or interstitial edema. No airspace opacities identified. The visualized osseous structures are unremarkable. IMPRESSION: No acute cardiopulmonary abnormalities. Electronically Signed   By: Signa Kell M.D.   On: 04/27/2021 12:42    EKG EKG Interpretation  Date/Time:  Tuesday April 27 2021 12:27:27 EST Ventricular Rate:  80 PR Interval:  141 QRS Duration: 84 QT Interval:  386 QTC Calculation: 446 R Axis:   47 Text  Interpretation: Sinus rhythm Left ventricular hypertrophy No previous tracing Confirmed by Cathren Laine ([id number]) on 04/27/2021 12:33:32 PM  Radiology DG Chest Port 1 View  Result Date: 04/27/2021 CLINICAL DATA:  Sepsis. EXAM: PORTABLE CHEST 1 VIEW COMPARISON:  None. FINDINGS: Previous median sternotomy and aortic valve repair heart size appears normal. No pleural effusion or interstitial edema. No airspace opacities identified. The visualized osseous structures are unremarkable. IMPRESSION: No acute cardiopulmonary abnormalities. Electronically Signed   By: Signa Kell M.D.   On: 04/27/2021 12:42    Procedures Procedures   Medications Ordered in ED Medications  lactated ringers infusion ( Intravenous New Bag/Given 04/27/21 1318)  vancomycin (VANCOREADY) IVPB 750 mg/150 mL (has no administration in time range)  ceFEPIme (MAXIPIME) 2 g in sodium chloride 0.9 % 100 mL IVPB (has no administration in time range)  pantoprazole (PROTONIX) 80 mg /NS 100 mL IVPB (has no administration in time range)  pantoprozole (PROTONIX) 80 mg /NS 100 mL infusion (has no administration in time range)  lactated ringers bolus 1,000 mL (0 mLs Intravenous Stopped 04/27/21 1317)    And  lactated ringers bolus 500 mL (0 mLs Intravenous Stopped 04/27/21 1452)  ceFEPIme (MAXIPIME) 2 g in sodium chloride 0.9 % 100 mL IVPB (0 g Intravenous Stopped 04/27/21 1317)  vancomycin (VANCOREADY) IVPB 1000 mg/200 mL (0 mg Intravenous Stopped 04/27/21 1452)    ED Course  I have reviewed the triage  vital signs and the nursing notes.  Pertinent labs & imaging results that were available during my care of the patient were reviewed by me and considered in my medical decision making (see chart for details).    MDM Rules/Calculators/A&P                           Iv ns boluses, 30 cc/kg. Cultures sent. Stat labs. Ecg. Continuous pulse ox and cardiac monitoring.   Reviewed nursing notes and prior charts for additional history.    2nd iv line. Code sepsis activated. After cultures drawn - iv antibiotics given.   Labs reviewed/interpreted by me - hgb very low, 7 - no prior to compare. Stool is strongly heme positive, and pt is on eliquis. Protonix iv bolus/gtt.  Covid test is positive.   CXR reviewed/interpreted by me - no pna.   Given extreme weakness, v low hgb on eliquis and heme pos stool, and covid positive - will admit for obs/further workup.   Medicine consulted for admission.      Final Clinical Impression(s) / ED Diagnoses Final diagnoses:  COVID-19 virus infection  Anemia, unspecified type    Rx / DC Orders ED Discharge Orders     None        Cathren Laine, MD 04/27/21 1501

## 2021-04-27 NOTE — Progress Notes (Signed)
Pharmacy Antibiotic Note  Jill Buchanan is a 60 y.o. female admitted on 04/27/2021 with sepsis. Febrile to 102 and WBC 10.6. Pharmacy has been consulted for Vancomycin and cefepime dosing.  Plan: Vancomycin 1000mg  x 1 load Vancomycin 750mg  q24h Cefepime 2 g q12h  Height: 5\' 3"  (160 cm) Weight: 49.9 kg (110 lb) IBW/kg (Calculated) : 52.4  Temp (24hrs), Avg:100.4 F (38 C), Min:100.4 F (38 C), Max:100.4 F (38 C)  No results for input(s): WBC, CREATININE, LATICACIDVEN, VANCOTROUGH, VANCOPEAK, VANCORANDOM, GENTTROUGH, GENTPEAK, GENTRANDOM, TOBRATROUGH, TOBRAPEAK, TOBRARND, AMIKACINPEAK, AMIKACINTROU, AMIKACIN in the last 168 hours.  CrCl cannot be calculated (No successful lab value found.).    Allergies  Allergen Reactions   Eggs Or Egg-Derived Products     Antimicrobials this admission: Cefepime 11/29 > Vancomycin 11/29 >  Dose adjustments this admission:  Microbiology results: 11/29 BCx:  11/29 UCx:  Thank you for allowing pharmacy to participate in this patient's care.  12/29, PharmD PGY1 Acute Care Resident  04/27/2021,1:45 PM

## 2021-04-27 NOTE — H&P (Addendum)
I have seen and discussed patient with the resident. I will cosign resident's note once it is completed.  60 Y/O F with PMX of Afib, CVA, blindness, CAD S/P CABG, Dysphagia with PEG tube, brought in from SNF with a change in mental and respiratory status. She has also been spiking fever.   Exam: Gen: Drowsy, not responsive to call HEENT: Blind in both eyes, AT/[place] head Neuro: No verbal response, unable to test eye opening due to blind status, did not localize sternal rob. ++ Biceps DTRs, normal Babinski response. Heart: S1 S2 normal, no murmur. Irregular heart rhythm, normal rate. Lungs:Air entry equal - unable to listen to lungs posteriorly. Abd: Soft, NT/ND, BS+ and normal Ext: No edema  A/P: 60 Y/O F with  Acute hypoxic respiratory failure secondary to COVID-19 infection with sepsis Chest X-ray reviewed without acute findings. She met SIRs criteria with fever, tachypnea, and likely respiratory source. Currently on empirical A/B coverage. Blood culture pending, but this is likely COVID-19 related. Obtain procalcitonin level to guide de-escalation of A/B. Start Remdesivir and Decadrone given her new O2 requirement with O2 desat to 90% on RA. Wean O2 as tolerated. Tylenol as needed for fever > 100.3  Change in mental status - likely due to the above problem. No documented hx of fall - resident will reach out to the nursing home to confirm. Obtain CT head Stat. Consult neuro if positive for acute CVA. Hold sedative agents. Implement fall precautions. Consult PT/OT.  Acute GI bleed with Normocytic anemia +FOBT on anticoagulant for Afib Hold anticoagulants. Blood transfusion threshold of <8 given her Cardiovascular hx. Type and cross and transfuse 1 unit PRBC - Keep hemoglobin above 8. Obtain post-transfusion CBC. Consult GI.  Mild hypernatremia: ?? Due to dehydration She is currently on LR @ 150cc/hr, switch to maintenance/gentle hydration. Recheck Na level in 6-8 hrs Monitor  her hydration status closely. Unclear if she has hx of CHF. May transition to PEG hydration once Sodium level normalizes. However, if Na level increases on LR, may switch to 1/2NS and monitor Na level closely.   Dysphagia - Feed via PEG Consult dietitian to help manage tube feed

## 2021-04-27 NOTE — H&P (Addendum)
[facility] Ad[facility] History and Physical Service Pager: [phone]  Patient name: Jill Buchanan Medical record number: [id number] Date of birth: 17-Nov-1960 Age: 60 y.o. Gender: female  Primary Care Provider: Pcp, No Consultants: Gastroenterology, Dietician Code Status: Full   Preferred Emergency Contact: Daughter Delma Freeze at [phone]    Chief Complaint: AMS, Fever  Assessment and Plan: Jill Buchanan is a 60 y.o. female presenting with AMS and fever . PMH is significant for A. 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    Sepsis secondary to COVID  Acute hypoxic respiratory failure Patient presents from SNF via EMS with AMS and fever. Patient arrived to ED febrile to 100.4, with a RR of 23 and pulse of 91, SpO2 90%. She was placed on 2L O2. Code sepsis was called and patient was given a LR bolus, blood and urine cultures were drawn, and patient was started on Cefepime and Vancomycin. Labs were concerning for a hgb of 7, with heme positive stool, and she was Covid positive. She had a normal lactic acid and INR, with a Na of 146 and WBC of 10.6. Her UA was positive for trace hgb and 100 protein, and rare bacteria seen on urine microscopy. She had a benign CXR. On admission, physical exam demonstrated hypovolemia evidenced by dry mucous membranes, delayed cap refill of 4 seconds, and dry skin. Differential includes COVID sepsis, given COVID status and SIRS presentation in ED. Patient could also be suffering from sepsis due to bacteriemia, less likely from UTI given UA, or pneumonia given CXR.  -Admit to FPTS, Attending Dr. Gwendlyn Deutscher -Consult RD, appreciate recommendations -Decadron and remdesivir -Procalcitonin level in am (d/c abx if normal) -Continue Cefepime -Continue Vancomycin -Continue mIVF at maintenance rate 75 mL/hr - PT/OT eval and treat - vitals per unit routine  AMS Patient presents with decreased alertness and  responsiveness, which waxes and wanes at baseline. Because of her co-morbidities causing blindness and dysarthria, this patient is non-verbal at baseline post-stroke. Per daughter, she usually responds with yes and no.  -CT head wo contrast  Anemia likely due to GI bleed Patient Hgb 7.6 on admission with dark tarry stool and positive FOBT. No records on this patient exist in our charting system, so we have no access to prior colonoscopy reports, if any exist. Daughter reports patient has no personal history of cancer. Patient with history of CAD s/p CABG, making transfusion threshold 8.  -Protonix 80 mg IV -Consulted GI, appreciate recommendations -H/H to confirm Hgb -after confirming Hgb, plan for transfusion 1 unit PRBC -CBC in am  Dysphagia s/p CVA, PEG in place Daughter reports PEG placed s/p CVA in October due to difficulty tolerating adequate volume of nutrition by mouth. Home med list from SNF does not indicate tube feeds.  - consult RD for tube feeds assessment - bedside swallow test, possibility of ice chips or water per mouth for comfort - routine mouth care orders - meds changed from PO to per tube  Hypernatremia Na on admission 146. Patient appears dehydrated with dry mucous membranes, delayed cap refill of 4 seconds, and dry skin. S/p 1.5 L LR bolus in ED, followed by initiation of mIVF. Plan to monitor and change choice of IV fluids as indicated.  - AM BMP - continue mIVF LR  History of A. Fib on DOAC Patient cardiac exam significant for irregularly irregular heart beat, with A. Fib on telemetry, with HR in 80-90's. Per daughter, A. Fib is  previously known personal history. Daughter reported that patient was on Xarelto, but chart indicates she currently takes Eliquis and aspirin.  -Continue Coreg 25 -Hold Eliquis and aspirin for GI bleed as above -Continuous Cardiac Monitoring - SCDs  Constipation -Continue Miralax 17g -Continue Senna 1 tablet daily  HLD -Continue  Atorvastatin   FEN/GI: NPO Prophylaxis: SCDs  Disposition: Med tele  History of Present Illness:  Jill Buchanan is a 60 y.o. female presenting with fever and AMS from SNF. Patient febrile to 102 at SNF with decreased alertness and responsiveness. Patient has waxing and waning responsiveness but is usual able to respond with to 'yes' 'no' questions. Patient has recent history of 5 strokes last month 03/18/21. She's been at her SNF for about a week. Patient also has PEG tube that was placed after her stroke, for concerns of her being able to eat enough.   Review Of Systems: Per HPI with the following additions:   Review of Systems   Patient Active Problem List   Diagnosis Date Noted   Fever 04/27/2021    Past Medical History: Past Medical History:  Diagnosis Date   A-fib [facility])    CVA (cerebral vascular accident) [facility])     Past Surgical History: CABG  PEG placement  Social History:   Additional social history:   Please also refer to relevant sections of EMR.  Family History: Family history of diabetes and heart disease.  Allergies and Medications: Allergies  Allergen Reactions   Eggs Or Egg-Derived Products    No current facility-administered medications on file prior to encounter.   Current Outpatient Medications on File Prior to Encounter  Medication Sig Dispense Refill   acetaminophen (TYLENOL) 500 MG tablet Take 500 mg by mouth every 4 (four) hours as needed for mild pain.     amLODipine (NORVASC) 10 MG tablet Take 10 mg by mouth daily.     aspirin EC 81 MG tablet Take 81 mg by mouth daily.     atorvastatin (LIPITOR) 80 MG tablet Take 80 mg by mouth daily.     carvedilol (COREG) 25 MG tablet Take 25 mg by mouth 2 (two) times daily.     ELIQUIS 5 MG TABS tablet Take 5 mg by mouth 2 (two) times daily.     Multiple Vitamins-Minerals (MULTIVITAMIN WITH MINERALS) tablet Take 1 tablet by mouth daily.     polyethylene glycol (MIRALAX / GLYCOLAX) 17 g packet Take 17 g  by mouth daily.     sennosides-docusate sodium (SENOKOT-S) 8.6-50 MG tablet Take 1 tablet by mouth daily.      Objective: BP (!) 140/92   Pulse 87   Temp (!) 100.4 F (38 C) (Rectal)   Resp 16   Ht 5\' 3"  (1.6 m)   Wt 49.9 kg   SpO2 98%   BMI 19.49 kg/m  Exam: General: Ill appearing, non-communicative, white woman, in no acute signs of distress Eyes: Left eye closed and appears watery, right eye intermittently opens, right sclera appears globally hyperkeratotic and without visible pupil (Patient has congenital blindness) ENTM: Dry mucous membranes with mucinous coating and thick tartar on front teeth, poor dentition, flaking lips Neck: Soft Cardiovascular: Irregularly irregular, NR[facility] Respiratory: CTABL Gastrointestinal: Soft, NTTP, non-distended, PEG tube in place in LUQ, is without surrounding erythema, swelling, or drainage MSK: Patient appears thin and frail Derm: No rashes or visible ulcers on feet, legs, abdomen, back, or buttocks, skin appears jaundiced and flaky Neuro: Unable to assess as patient non verbal and poorly cooperative  Psych: Unable to assess as patient non verbal and poorly cooperative, does not recoil or react to physical touch, does not smile or frown, but does keep mouth open after oral exam  Labs and Imaging: CBC BMET  Recent Labs  Lab 04/27/21 1143 04/27/21 1654  WBC 10.6*  --   HGB 7.6* 6.6*  HCT 24.8* 21.8*  PLT 230  --    Recent Labs  Lab 04/27/21 1143  NA 146*  K 4.3  CL 111  CO2 25  BUN 32*  CREATININE 0.96  GLUCOSE 131*  CALCIUM 9.6     PORTABLE CHEST 1 VIEW 04/27/21 FINDINGS: Previous median sternotomy and aortic valve repair heart size appears normal. No pleural effusion or interstitial edema. No airspace opacities identified. The visualized osseous structures are unremarkable. IMPRESSION: No acute cardiopulmonary abnormalities.  EKG: A fib with vent rate of 80, and QTC of 446, no ST elevation  Bess Kinds,  MD 04/27/2021, 4:15 PM PGY-1, [facility] FPTS Intern pager: [phone], text pages welcome   Upper Level Addendum: I have seen and evaluated this patient along with Dr. Barbaraann Faster and reviewed the above note, making necessary revisions as appropriate. These are denoted by green text. I agree with the medical decision making and physical exam as noted above. Fayette Pho, MD PGY-2 [facility] Family Medicine Residency

## 2021-04-27 NOTE — Discharge Instructions (Addendum)
It was our pleasure to provide your ER care today - we hope that you feel better.  Your covid test is positive - see attached info.  Give adequate fluids/stay adequately hydrated. Take acetaminophen as need.   Return to ER if worse, new symptoms, increased trouble breathing, or other concern.   ===============================================================================  Information on my medicine - ELIQUIS (apixaban)  Why was Eliquis prescribed for you? Eliquis was prescribed for you to reduce the risk of a blood clot forming that can cause a stroke if you have a medical condition called atrial fibrillation (a type of irregular heartbeat).  What do You need to know about Eliquis ? Take your Eliquis TWICE DAILY - one tablet in the morning and one tablet in the evening with or without food. If you have difficulty swallowing the tablet whole please discuss with your pharmacist how to take the medication safely.  Take Eliquis exactly as prescribed by your doctor and DO NOT stop taking Eliquis without talking to the doctor who prescribed the medication.  Stopping may increase your risk of developing a stroke.  Refill your prescription before you run out.  After discharge, you should have regular check-up appointments with your healthcare provider that is prescribing your Eliquis.  In the future your dose may need to be changed if your kidney function or weight changes by a significant amount or as you get older.  What do you do if you miss a dose? If you miss a dose, take it as soon as you remember on the same day and resume taking twice daily.  Do not take more than one dose of ELIQUIS at the same time to make up a missed dose.  Important Safety Information A possible side effect of Eliquis is bleeding. You should call your healthcare provider right away if you experience any of the following: Bleeding from an injury or your nose that does not stop. Unusual colored urine (red or  dark brown) or unusual colored stools (red or black). Unusual bruising for unknown reasons. A serious fall or if you hit your head (even if there is no bleeding).  Some medicines may interact with Eliquis and might increase your risk of bleeding or clotting while on Eliquis. To help avoid this, consult your healthcare provider or pharmacist prior to using any new prescription or non-prescription medications, including herbals, vitamins, non-steroidal anti-inflammatory drugs (NSAIDs) and supplements.  This website has more information on Eliquis (apixaban): http://www.eliquis.com/eliquis/home

## 2021-04-27 NOTE — ED Notes (Signed)
Pt chewing on pulse ox. Mittens ordered

## 2021-04-27 NOTE — Sepsis Progress Note (Signed)
Elink is following this Code Sepsis. 

## 2021-04-27 NOTE — ED Triage Notes (Addendum)
Pt arrives from [facility] for acute onset rhonchi in lungs and AMS. Nursing home concerned for aspiration. Pt is blind and normally respond yes or no. Pt only responding to verbal stimuli at this time. SpO2 90 on RA

## 2021-04-28 ENCOUNTER — Observation Stay (HOSPITAL_COMMUNITY): Payer: Medicaid Other

## 2021-04-28 DIAGNOSIS — J9601 Acute respiratory failure with hypoxia: Secondary | ICD-10-CM | POA: Diagnosis present

## 2021-04-28 DIAGNOSIS — R531 Weakness: Secondary | ICD-10-CM

## 2021-04-28 DIAGNOSIS — Z8673 Personal history of transient ischemic attack (TIA), and cerebral infarction without residual deficits: Secondary | ICD-10-CM | POA: Diagnosis not present

## 2021-04-28 DIAGNOSIS — D62 Acute posthemorrhagic anemia: Secondary | ICD-10-CM | POA: Diagnosis present

## 2021-04-28 DIAGNOSIS — I6381 Other cerebral infarction due to occlusion or stenosis of small artery: Secondary | ICD-10-CM | POA: Diagnosis present

## 2021-04-28 DIAGNOSIS — Z931 Gastrostomy status: Secondary | ICD-10-CM | POA: Diagnosis not present

## 2021-04-28 DIAGNOSIS — I4821 Permanent atrial fibrillation: Secondary | ICD-10-CM | POA: Diagnosis present

## 2021-04-28 DIAGNOSIS — R1310 Dysphagia, unspecified: Secondary | ICD-10-CM | POA: Diagnosis present

## 2021-04-28 DIAGNOSIS — R195 Other fecal abnormalities: Secondary | ICD-10-CM | POA: Diagnosis not present

## 2021-04-28 DIAGNOSIS — K922 Gastrointestinal hemorrhage, unspecified: Secondary | ICD-10-CM | POA: Diagnosis present

## 2021-04-28 DIAGNOSIS — G9349 Other encephalopathy: Secondary | ICD-10-CM | POA: Diagnosis present

## 2021-04-28 DIAGNOSIS — Z66 Do not resuscitate: Secondary | ICD-10-CM | POA: Diagnosis present

## 2021-04-28 DIAGNOSIS — E860 Dehydration: Secondary | ICD-10-CM | POA: Diagnosis present

## 2021-04-28 DIAGNOSIS — U071 COVID-19: Secondary | ICD-10-CM | POA: Diagnosis present

## 2021-04-28 DIAGNOSIS — I69391 Dysphagia following cerebral infarction: Secondary | ICD-10-CM | POA: Diagnosis not present

## 2021-04-28 DIAGNOSIS — I63413 Cerebral infarction due to embolism of bilateral middle cerebral arteries: Secondary | ICD-10-CM | POA: Diagnosis not present

## 2021-04-28 DIAGNOSIS — Z7982 Long term (current) use of aspirin: Secondary | ICD-10-CM | POA: Diagnosis not present

## 2021-04-28 DIAGNOSIS — I639 Cerebral infarction, unspecified: Secondary | ICD-10-CM | POA: Diagnosis not present

## 2021-04-28 DIAGNOSIS — I2510 Atherosclerotic heart disease of native coronary artery without angina pectoris: Secondary | ICD-10-CM | POA: Diagnosis present

## 2021-04-28 DIAGNOSIS — D649 Anemia, unspecified: Secondary | ICD-10-CM | POA: Diagnosis not present

## 2021-04-28 DIAGNOSIS — E785 Hyperlipidemia, unspecified: Secondary | ICD-10-CM | POA: Diagnosis present

## 2021-04-28 DIAGNOSIS — A4189 Other specified sepsis: Secondary | ICD-10-CM | POA: Diagnosis present

## 2021-04-28 DIAGNOSIS — Z951 Presence of aortocoronary bypass graft: Secondary | ICD-10-CM | POA: Diagnosis not present

## 2021-04-28 DIAGNOSIS — H547 Unspecified visual loss: Secondary | ICD-10-CM | POA: Diagnosis present

## 2021-04-28 DIAGNOSIS — R509 Fever, unspecified: Secondary | ICD-10-CM

## 2021-04-28 DIAGNOSIS — Z7189 Other specified counseling: Secondary | ICD-10-CM | POA: Diagnosis not present

## 2021-04-28 DIAGNOSIS — I6389 Other cerebral infarction: Secondary | ICD-10-CM | POA: Diagnosis not present

## 2021-04-28 DIAGNOSIS — E870 Hyperosmolality and hypernatremia: Secondary | ICD-10-CM | POA: Diagnosis present

## 2021-04-28 DIAGNOSIS — Z79899 Other long term (current) drug therapy: Secondary | ICD-10-CM | POA: Diagnosis not present

## 2021-04-28 DIAGNOSIS — K5900 Constipation, unspecified: Secondary | ICD-10-CM | POA: Diagnosis present

## 2021-04-28 DIAGNOSIS — R471 Dysarthria and anarthria: Secondary | ICD-10-CM | POA: Diagnosis present

## 2021-04-28 DIAGNOSIS — I10 Essential (primary) hypertension: Secondary | ICD-10-CM | POA: Diagnosis present

## 2021-04-28 LAB — CBC
HCT: 30.4 % — ABNORMAL LOW (ref 36.0–46.0)
Hemoglobin: 9.7 g/dL — ABNORMAL LOW (ref 12.0–15.0)
MCH: 28.3 pg (ref 26.0–34.0)
MCHC: 31.9 g/dL (ref 30.0–36.0)
MCV: 88.6 fL (ref 80.0–100.0)
Platelets: 191 10*3/uL (ref 150–400)
RBC: 3.43 MIL/uL — ABNORMAL LOW (ref 3.87–5.11)
RDW: 15.9 % — ABNORMAL HIGH (ref 11.5–15.5)
WBC: 9.1 10*3/uL (ref 4.0–10.5)
nRBC: 0.2 % (ref 0.0–0.2)

## 2021-04-28 LAB — COMPREHENSIVE METABOLIC PANEL
ALT: 19 U/L (ref 0–44)
AST: 30 U/L (ref 15–41)
Albumin: 3 g/dL — ABNORMAL LOW (ref 3.5–5.0)
Alkaline Phosphatase: 68 U/L (ref 38–126)
Anion gap: 10 (ref 5–15)
BUN: 23 mg/dL (ref 8–23)
CO2: 23 mmol/L (ref 22–32)
Calcium: 9.4 mg/dL (ref 8.9–10.3)
Chloride: 106 mmol/L (ref 98–111)
Creatinine, Ser: 0.75 mg/dL (ref 0.44–1.00)
GFR, Estimated: >60 mL/min (ref >60)
Glucose, Bld: 132 mg/dL — ABNORMAL HIGH (ref 70–99)
Potassium: 4.2 mmol/L (ref 3.5–5.1)
Sodium: 139 mmol/L (ref 135–145)
Total Bilirubin: 1.1 mg/dL (ref 0.3–1.2)
Total Protein: 6.8 g/dL (ref 6.5–8.1)

## 2021-04-28 LAB — HEMOGLOBIN AND HEMATOCRIT, BLOOD
HCT: 30.8 % — ABNORMAL LOW (ref 36.0–46.0)
Hemoglobin: 9.5 g/dL — ABNORMAL LOW (ref 12.0–15.0)

## 2021-04-28 LAB — GLUCOSE, CAPILLARY
Glucose-Capillary: 93 mg/dL (ref 70–99)
Glucose-Capillary: 93 mg/dL (ref 70–99)

## 2021-04-28 LAB — TRIGLYCERIDES: Triglycerides: 98 mg/dL (ref <150)

## 2021-04-28 LAB — CBG MONITORING, ED
Glucose-Capillary: 102 mg/dL — ABNORMAL HIGH (ref 70–99)
Glucose-Capillary: 128 mg/dL — ABNORMAL HIGH (ref 70–99)

## 2021-04-28 IMAGING — DX DG ABD PORTABLE 1V
1 series · 1 of 1 positions shown · non-contrast
Comparison: None.

CLINICAL DATA: Altered mental status

EXAM:
PORTABLE ABDOMEN - 1 VIEW

[abdomen kub]
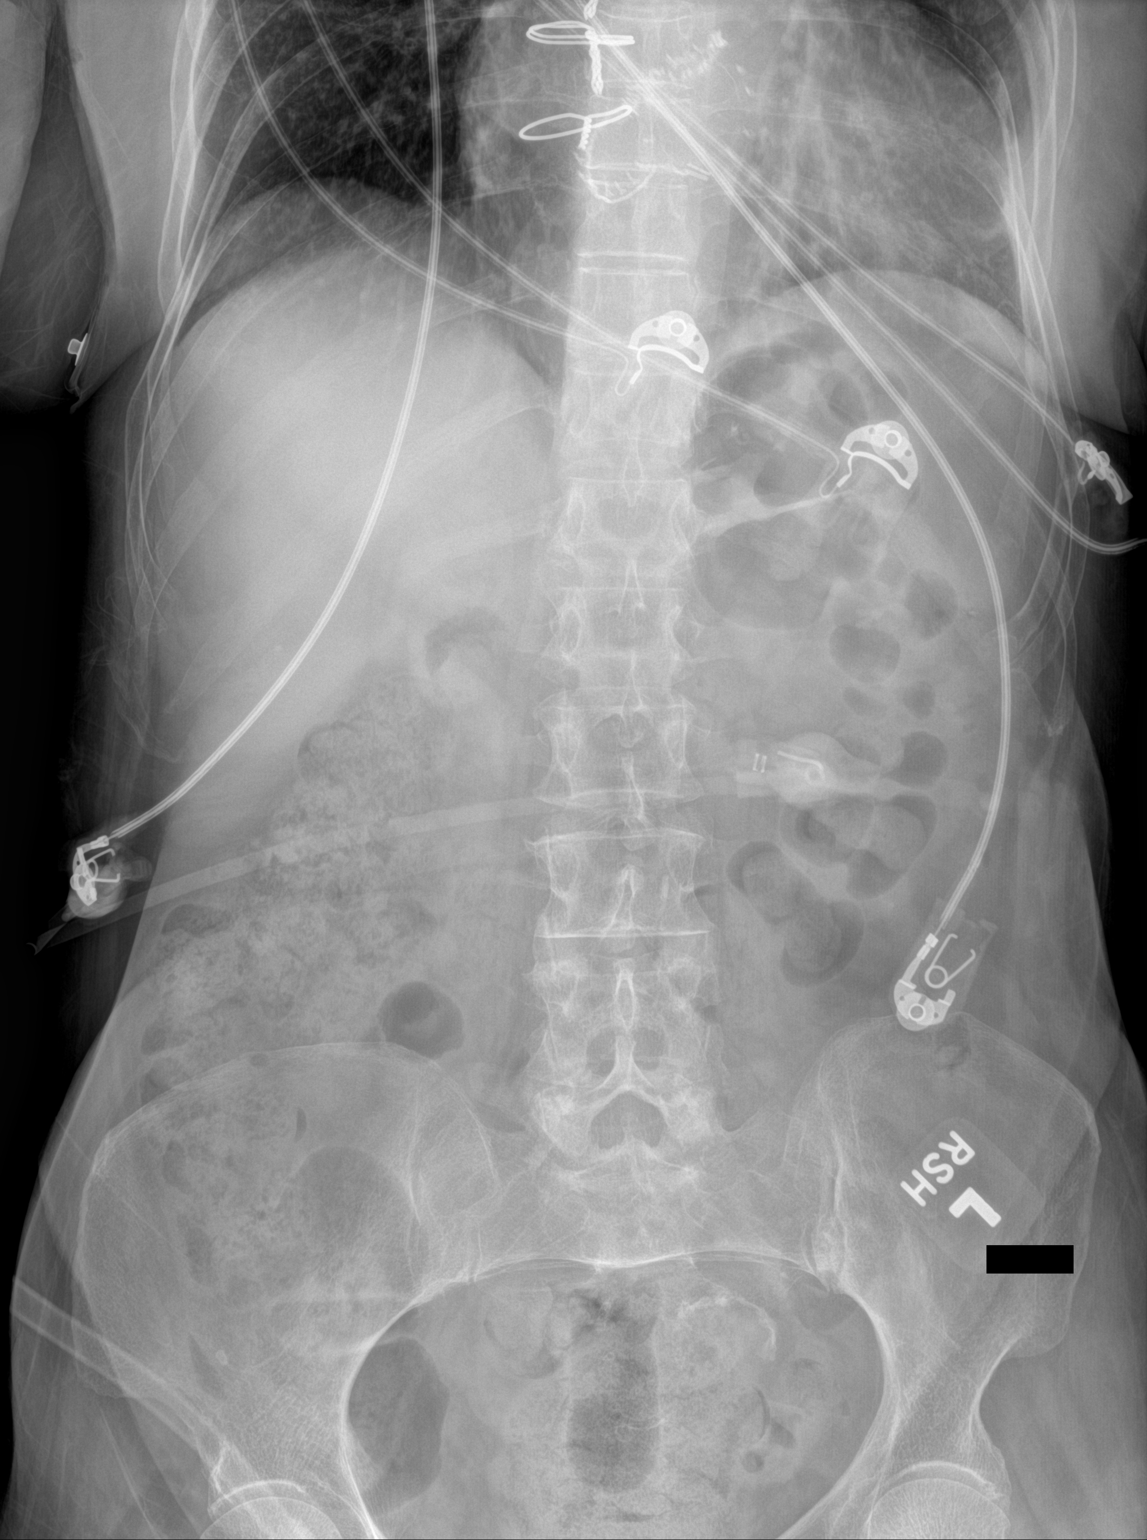

[1 of 1 positions shown; findings below may reference images not displayed]

FINDINGS: The bowel gas pattern is normal. Moderate burden of stool throughout
the colon. No obvious free air on supine radiographs. No
radio-opaque calculi or other significant radiographic abnormality
are seen.
IMPRESSION: Nonobstructive pattern of bowel gas. Moderate burden of stool
throughout the colon.

## 2021-04-28 IMAGING — MR MR HEAD W/O CM
7 of 10 series · 39 of 48 positions shown · non-contrast
Comparison: Head CT yesterday.

CLINICAL DATA: Neuro deficit, acute, stroke suspected. COVID
sepsis. Decreased responsiveness. Blindness and dysarthria.

EXAM:
MRI HEAD WITHOUT CONTRAST
TECHNIQUE: Multiplanar, multiecho pulse sequences of the brain and surrounding
structures were obtained without intravenous contrast.

[Series 3: DWI · axial · 3.0mm · 1.09mm/px · z∈[-38,+100]mm · 11 of 94 slices shown (1 of 4)]
[im 1/94]
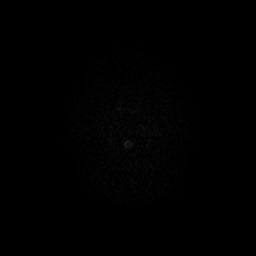
[im 10/94]
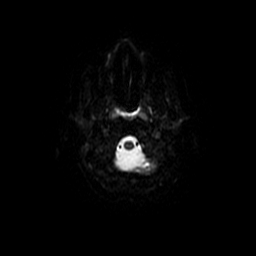
[im 19/94]
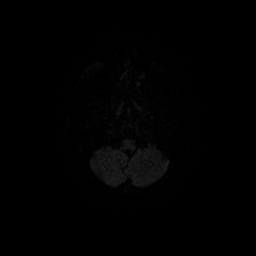
[im 28/94]
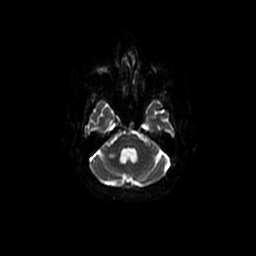
[im 38/94]
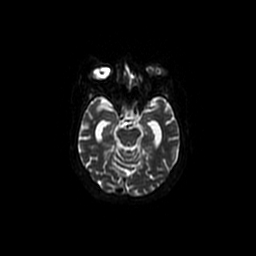
[im 47/94]
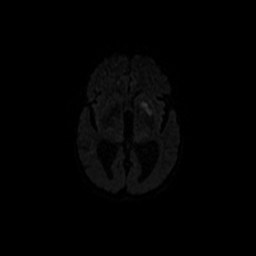
[im 56/94]
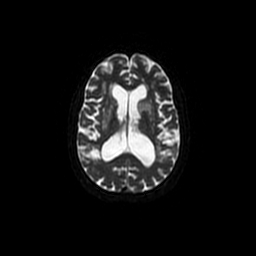
[im 66/94]
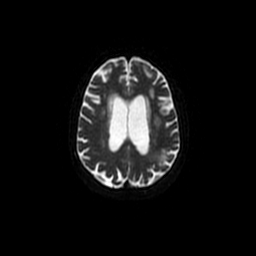
[im 75/94]
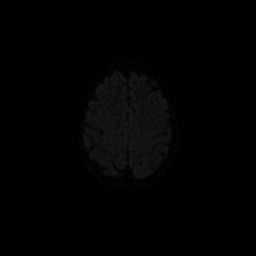
[im 84/94]
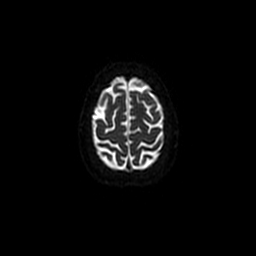
[im 94/94]
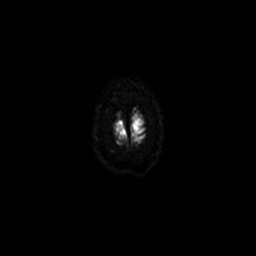

[Series 4: DWI · coronal · 5.0mm · 1.09mm/px · 9 of 68 slices shown (2 of 4)]
[im 1/68]
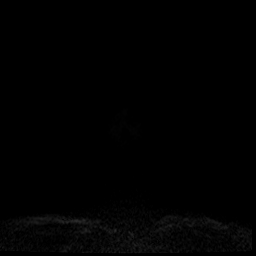
[im 9/68]
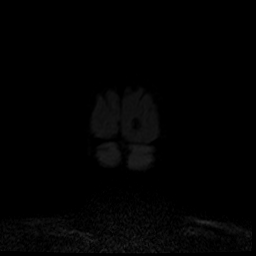
[im 17/68]
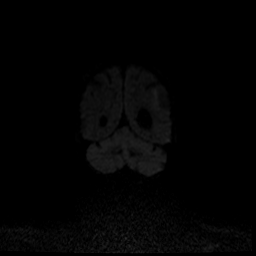
[im 26/68]
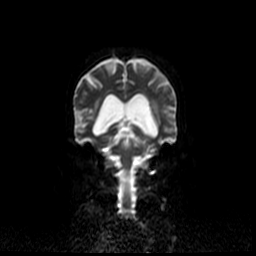
[im 34/68]
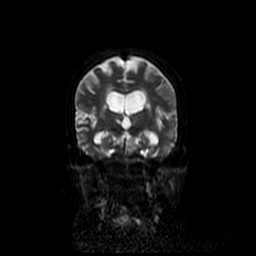
[im 42/68]
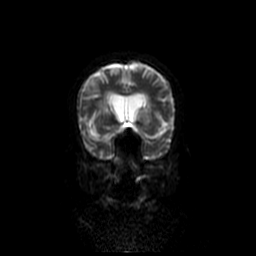
[im 51/68]
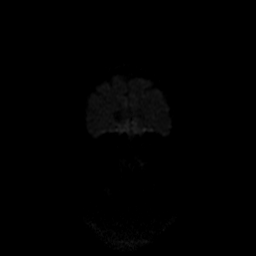
[im 59/68]
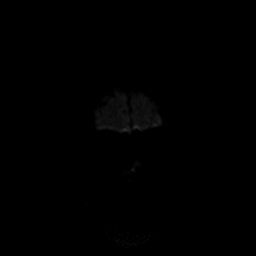
[im 68/68]
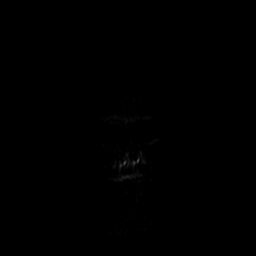

[Series 6: T2 · axial · 5.0mm · 0.43mm/px · z∈[-37,+100]mm · 3 of 24 slices shown]
[im 1/24]
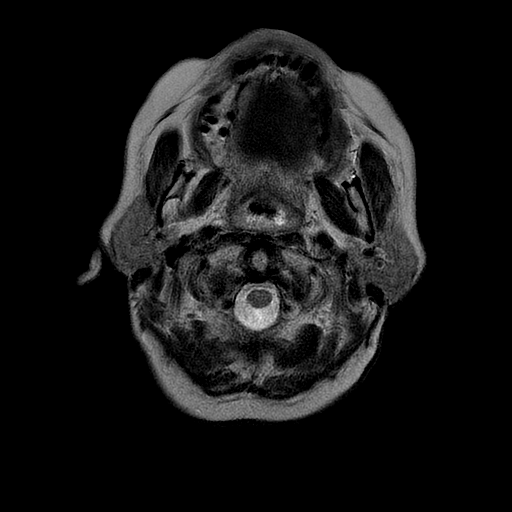
[im 12/24]
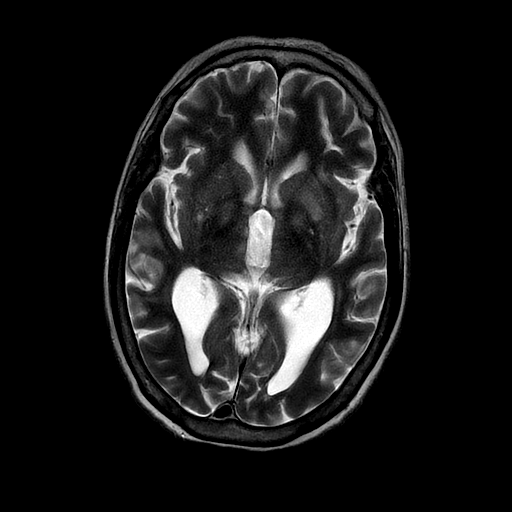
[im 24/24]
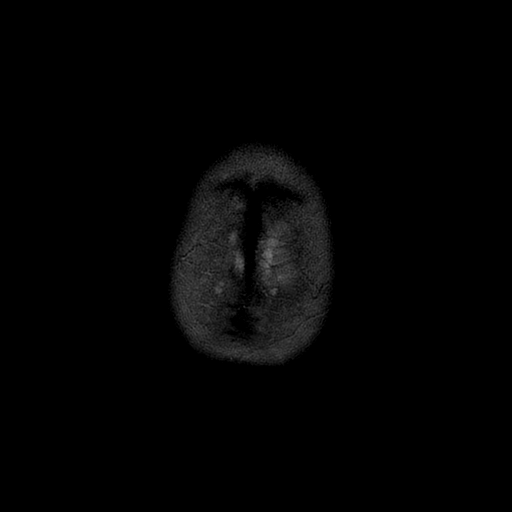

[Series 7: FLAIR · axial · 3.0mm · 0.43mm/px · z∈[-37,+100]mm · 3 of 24 slices shown (1 of 2)]
[im 1/24]
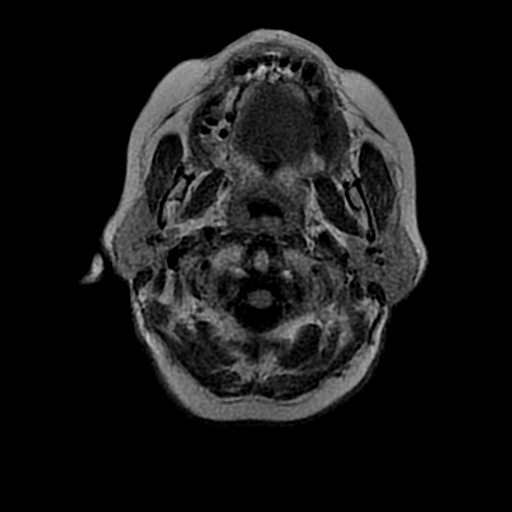
[im 12/24]
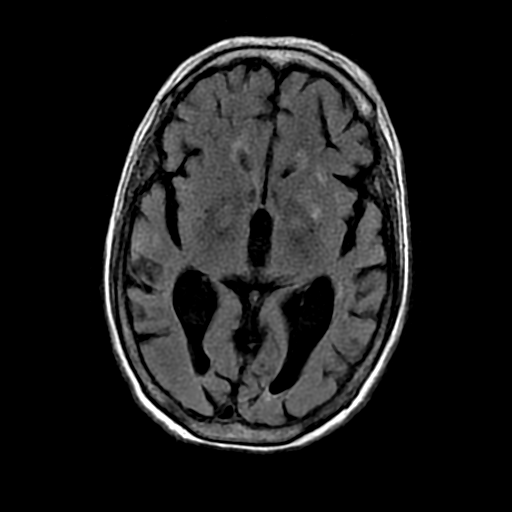
[im 24/24]
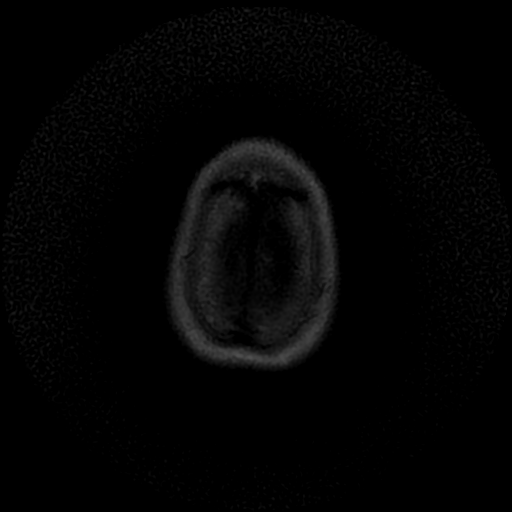

[Series 9: FLAIR · axial · 5.0mm · 0.43mm/px · z∈[-37,+100]mm · 3 of 24 slices shown (2 of 2)]
[im 1/24]
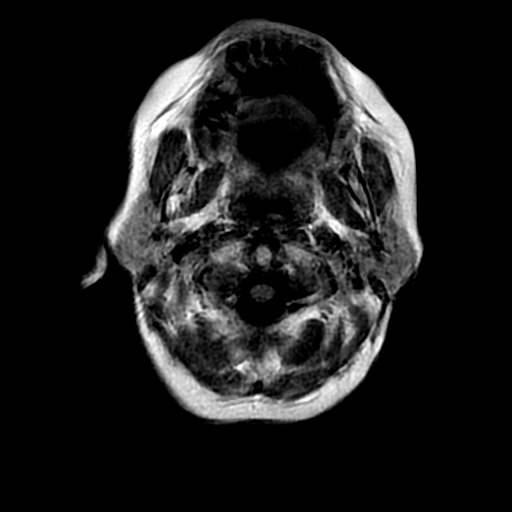
[im 12/24]
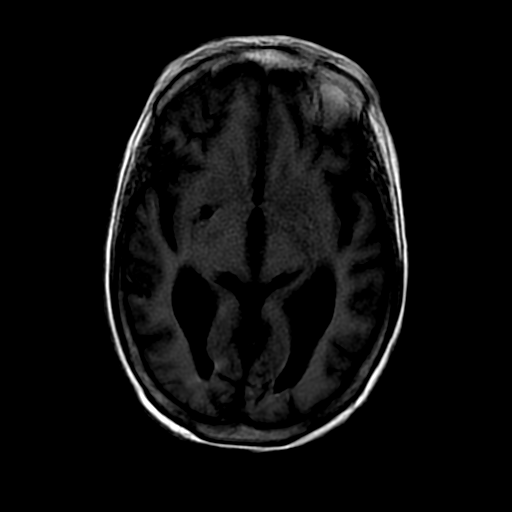
[im 24/24]
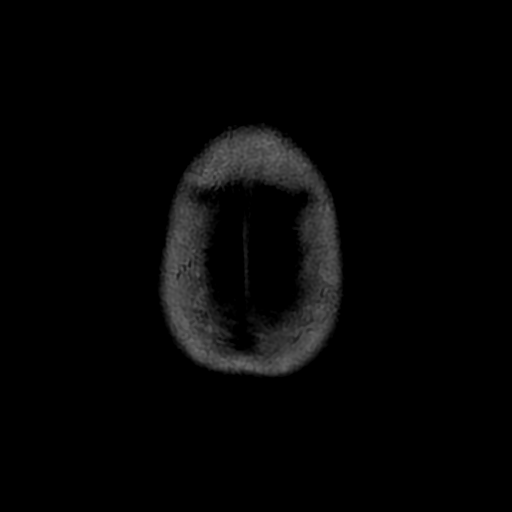

[Series 300: DWI · axial · 3.0mm · 1.09mm/px · z∈[-38,+100]mm · 6 of 47 slices shown (3 of 4)]
[im 1/47]
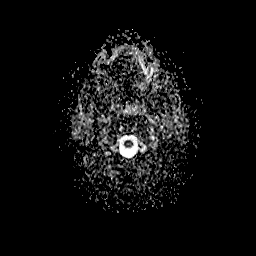
[im 10/47]
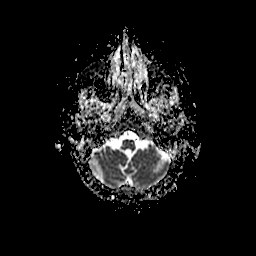
[im 19/47]
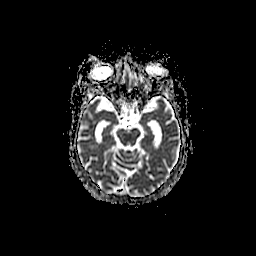
[im 28/47]
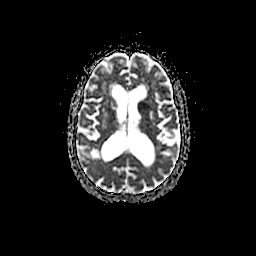
[im 37/47]
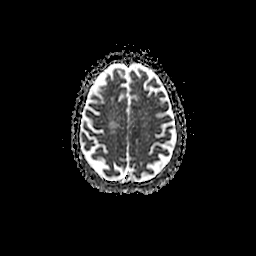
[im 47/47]
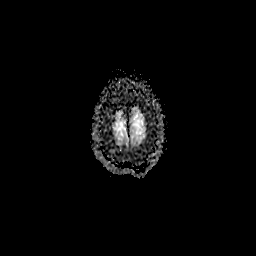

[Series 400: DWI · coronal · 5.0mm · 1.09mm/px · 4 of 34 slices shown (4 of 4)]
[im 1/34]
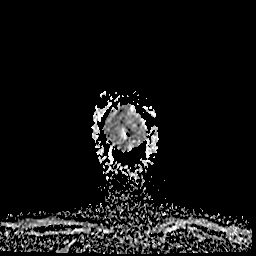
[im 12/34]
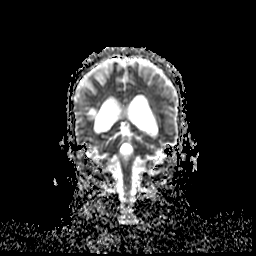
[im 23/34]
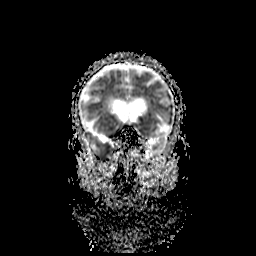
[im 34/34]
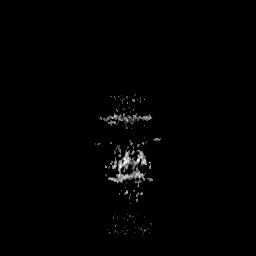

[39 of 48 positions shown; findings below may reference images not displayed]

FINDINGS: Brain: There is a background pattern of chronic ischemic changes
affecting the brainstem, cerebellum, thalami, basal ganglia,
cerebral hemispheric white matter and right inferior parietal
cortical brain and left parietal cortical brain. There are numerous
foci of restricted diffusion consistent with acute infarctions. The
largest area affects the left basal ganglia and internal capsule,
measuring about 2 cm in size. Other smaller acute infarctions affect
the middle cerebellar peduncle region on the right, punctate focus
in the left cerebellum, smudgy foci within both thalami, areas of
acute infarction within the deep white matter and both caudate
bodies. I think there is T2 shine through associated with old
bilateral parietal cortical and subcortical infarctions. No evidence
of hemorrhage. No hydrocephalus or extra-axial collection.

Vascular: Major vessels at the base of the brain show flow.

Skull and upper cervical spine: Negative

Sinuses/Orbits: Sinuses are clear.  Bilateral phthisis bulbi.

Other: None
IMPRESSION: Background pattern of atrophy and extensive ischemic changes
throughout the brain. 2 cm region of acute infarction within the
left basal ganglia and internal capsule. Smaller areas of acute
infarction within both caudate bodies, the cerebellum and the
thalami. Old bilateral parietal cortical and subcortical infarctions
with what I think is some T2 shine through. There could possibly be
some acute infarction adjacent to the left parietal old infarction.
This pattern of disease could be due to global hypoperfusion,
embolic disease or vasculitis.

## 2021-04-28 IMAGING — DX DG PORTABLE PELVIS
1 series · 1 of 1 positions shown · non-contrast
Comparison: None.

CLINICAL DATA: Altered mental status

EXAM:
PORTABLE PELVIS 1-2 VIEWS

[pelvis ap]
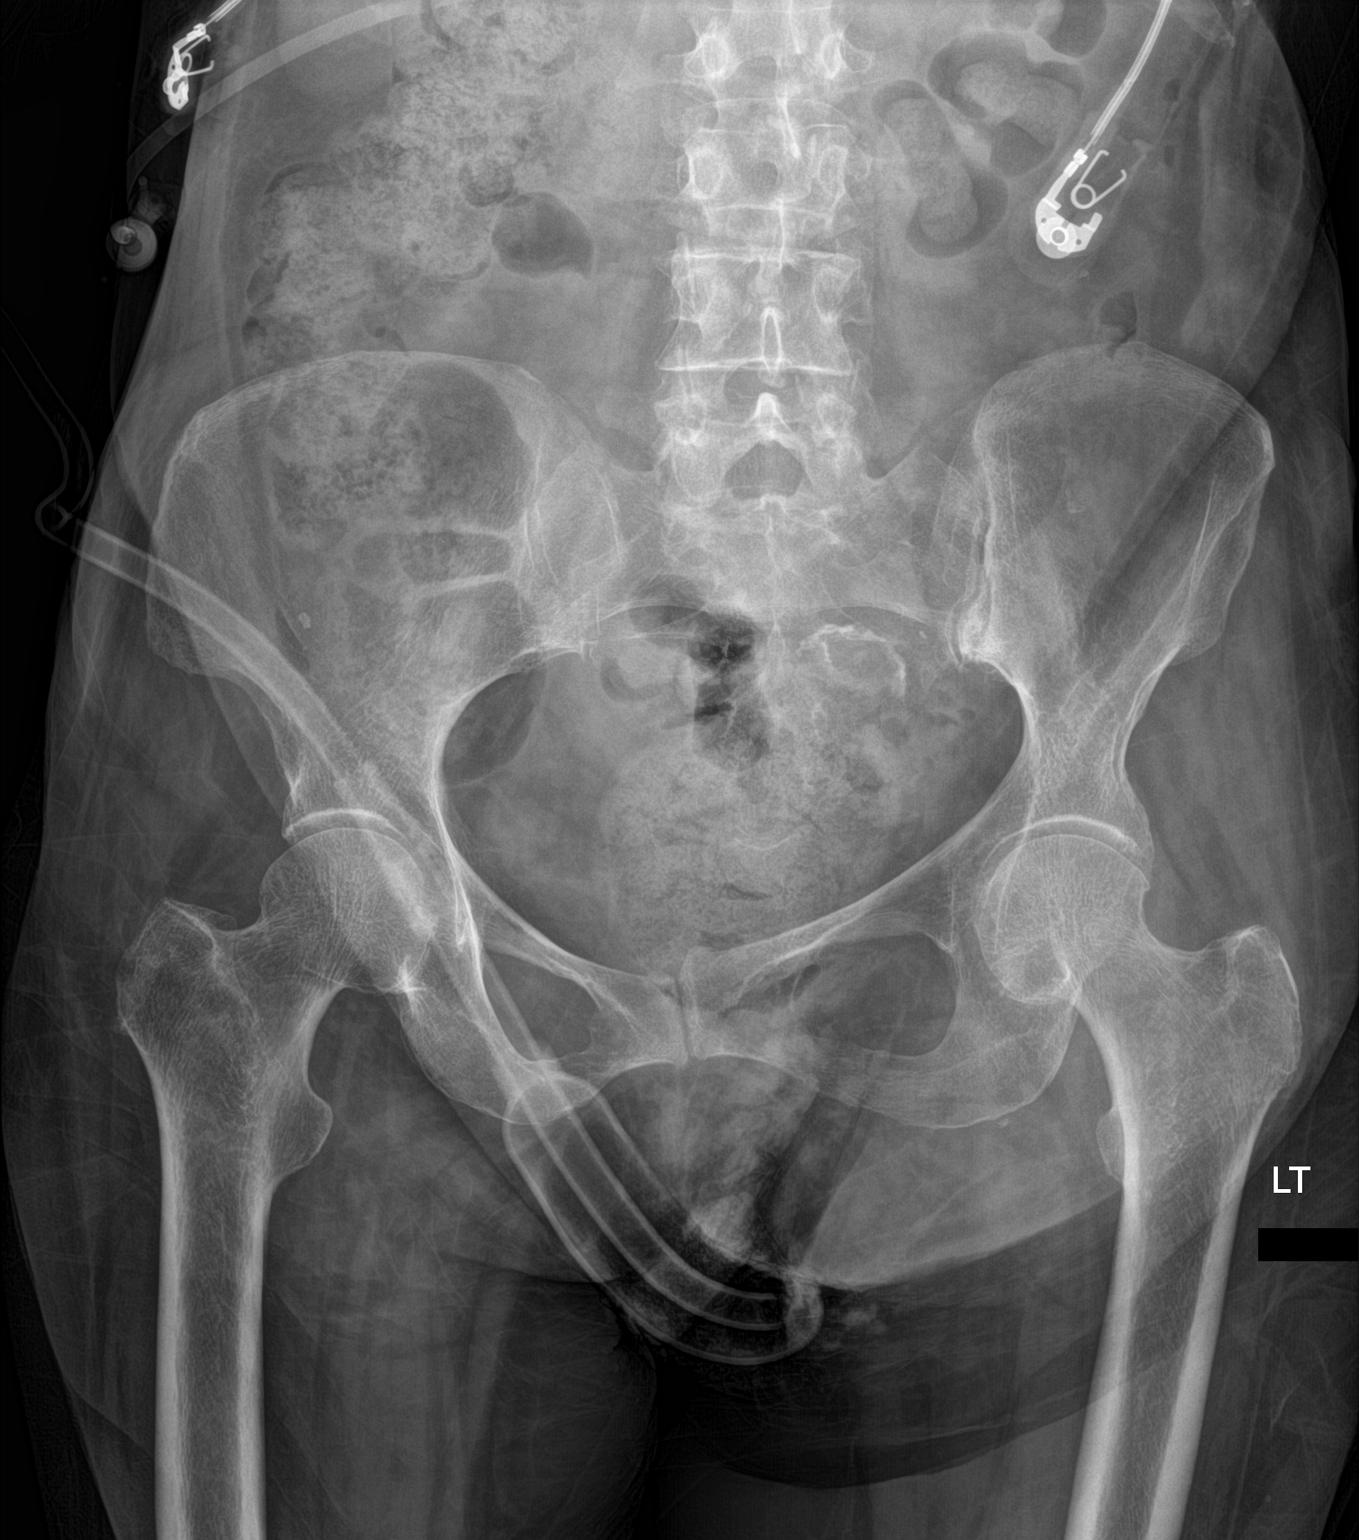

[1 of 1 positions shown; findings below may reference images not displayed]

FINDINGS: There is no evidence of pelvic fracture or diastasis. No pelvic bone
lesions are seen.
IMPRESSION: Negative.

## 2021-04-28 MED ORDER — LACTATED RINGERS IV SOLN: INTRAVENOUS | Status: DC

## 2021-04-28 MED ORDER — OSMOLITE 1.2 CAL PO LIQD
1000.0000 mL | ORAL | Status: DC
  Administered 2021-04-28: 1000 mL
  Filled 2021-04-28 (×2): qty 1000

## 2021-04-28 MED ORDER — POLYETHYLENE GLYCOL 3350 17 G PO PACK
17.0000 g | PACK | Freq: Two times a day (BID) | ORAL | Status: DC
Start: 2021-04-28 — End: 2021-05-02
  Administered 2021-04-28 – 2021-05-01 (×5): 17 g
  Filled 2021-04-28 (×6): qty 1

## 2021-04-28 MED ORDER — FREE WATER: 200.0000 mL | Status: DC

## 2021-04-28 NOTE — Progress Notes (Signed)
Brief Nutrition Note  Consult received for enteral/tube feeding initiation and management. Pt with PEG tube in place per MD notes. Per notes, PEG placed due to dysphagia s/p CVA.  Adult Enteral Nutrition Protocol initiated. Full assessment to follow.  Admitting Dx: Acute GI bleeding [K92.2] Heme positive stool [R19.5] General weakness [R53.1] Fever [R50.9] Encounter for imaging to screen for metal prior to MRI [Z13.89] Anemia, unspecified type [D64.9] COVID-19 virus infection [U07.1] Stroke (HCC) [I63.9]  Body mass index is 18.55 kg/m. Suspect pt with malnutrition.  Labs:  Recent Labs  Lab 04/27/21 1143 04/28/21 0337  NA 146* 139  K 4.3 4.2  CL 111 106  CO2 25 23  BUN 32* 23  CREATININE 0.96 0.75  CALCIUM 9.6 9.4  GLUCOSE 131* 132*    Mertie Clause, MS, RD, LDN Inpatient Clinical Dietitian Please see AMiON for contact information.

## 2021-04-28 NOTE — TOC Initial Note (Signed)
Transition of Care [facility]) - Initial/Assessment Note    Patient Details  Name: Jill Buchanan MRN: [id number] Date of Birth: [birth year]  Transition of Care [facility]) CM/SW Contact:    Mearl Latin, LCSW Phone Number: 04/28/2021, 12:55 PM  Clinical Narrative:                 Patient resides at [facility] and can return when discharged. They are aware she is COVID positive.   Expected Discharge Plan: Skilled Nursing Facility Barriers to Discharge: Continued Medical Work up   Patient Goals and CMS Choice Patient states their goals for this hospitalization and ongoing recovery are:: Return to SNF      Expected Discharge Plan and Services Expected Discharge Plan: Skilled Nursing Facility In-house Referral: Clinical Social Work   Post Acute Care Choice: Skilled Nursing Facility Living arrangements for the past 2 months: Skilled Nursing Facility                                      Prior Living Arrangements/Services Living arrangements for the past 2 months: Skilled Nursing Facility Lives with:: Facility Resident Patient language and need for interpreter reviewed:: Yes Do you feel safe going back to the place where you live?: Yes      Need for Family Participation in Patient Care: Yes (Comment) Care giver support system in place?: Yes (comment)   Criminal Activity/Legal Involvement Pertinent to Current Situation/Hospitalization: No - Comment as needed  Activities of Daily Living      Permission Sought/Granted Permission sought to share information with : Facility Medical sales representative, Family Supports Permission granted to share information with : No  Share Information with NAME: Corrie Dandy  Permission granted to share info w AGENCY: [facility]  Permission granted to share info w Relationship: Daughter  Permission granted to share info w Contact Information: [phone]  Emotional Assessment Appearance:: Appears stated age Attitude/Demeanor/Rapport: Unable to  Assess Affect (typically observed): Unable to Assess Orientation: :  (Disoriented x4) Alcohol / Substance Use: Not Applicable Psych Involvement: No (comment)  Admission diagnosis:  Acute GI bleeding [K92.2] Heme positive stool [R19.5] General weakness [R53.1] Fever [R50.9] Encounter for imaging to screen for metal prior to MRI [Z13.89] Anemia, unspecified type [D64.9] COVID-19 virus infection [U07.1] Patient Active Problem List   Diagnosis Date Noted   Fever 04/27/2021   PCP:  Pcp, No Pharmacy:  No Pharmacies Listed    Social Determinants of Health (SDOH) Interventions    Readmission Risk Interventions No flowsheet data found.

## 2021-04-28 NOTE — Progress Notes (Signed)
Occupational Therapy Evaluation  Pt presents with decreased balance, vision, and cognition. Pt nonverbal during eval although did communicate with some gestures. Pt currently requiring Max - Total A for ADLs and functional transfers/mobility. Pt is resident of long-term care facility and likely near baseline. Anticipate d/c back to long-term care facility. No further skilled OT needs at this time. Will sign off.    04/28/21 1500  OT Visit Information  Last OT Received On 04/28/21  Assistance Needed +2  PT/OT/SLP Co-Evaluation/Treatment Yes  Reason for Co-Treatment For patient/therapist safety;To address functional/ADL transfers;Complexity of the patient's impairments (multi-system involvement);Necessary to address cognition/behavior during functional activity  OT goals addressed during session ADL's and self-care;Strengthening/ROM  History of Present Illness Pt is a 60 y/o female admitted 11/29 from SNF secondary to AMS and decreased responsiveness. Found to be COVID +. PMH includes Afib, CVA, blindness, CAD S/P CABG, Dysphagia with PEG tube.  Precautions  Precautions Fall  Precaution Comments peg tube  Restrictions  Weight Bearing Restrictions No  Home Living  Family/patient expects to be discharged to: Skilled nursing facility  Prior Function  ADLs Comments Difficult to determine, however suspect pt requiring assistance with ADLs at baseline  Communication  Communication Expressive difficulties  Pain Assessment  Pain Assessment Faces  Faces Pain Scale 2  Pain Location generalized  Pain Descriptors / Indicators Grimacing;Guarding  Pain Intervention(s) Limited activity within patient's tolerance;Monitored during session;Repositioned  Cognition  Arousal/Alertness Awake/alert  Behavior During Therapy Flat affect  Overall Cognitive Status No family/caregiver present to determine baseline cognitive functioning  General Comments Follows some commands to assist with mobility tasks, but  nonverbal throughout session  Upper Extremity Assessment  Upper Extremity Assessment Generalized weakness;RUE deficits/detail  RUE Deficits / Details Little volitional movement at elbow, wrist, and digits. Noted tightness inwrist flexors and extensors.  Lower Extremity Assessment  Lower Extremity Assessment Defer to PT evaluation  Cervical / Trunk Assessment  Cervical / Trunk Assessment Kyphotic  ADL  Overall ADL's  Needs assistance/impaired  Eating/Feeding Total assistance  Grooming Total assistance  Upper Body Bathing Total assistance  Lower Body Bathing Total assistance  Upper Body Dressing  Total assistance  Lower Body Dressing Total assistance  Toilet Transfer Total assistance  Toileting- Clothing Manipulation and Hygiene Total assistance  Bed Mobility  Overal bed mobility Needs Assistance  Bed Mobility Supine to Sit;Sit to Supine;Rolling  Rolling Max assist  Supine to sit Max assist;+2 for physical assistance  Sit to supine Max assist;+2 for physical assistance  General bed mobility comments Max A +2 for trunk and LE assist to come to sitting. Pt with heavy posterior lean and requiring total A to maintain. Returned to supine and remainder of session focused on clean up.  Balance  Overall balance assessment Needs assistance  Sitting-balance support No upper extremity supported;Feet supported  Sitting balance-Leahy Scale Zero  Sitting balance - Comments heavy posterior lean. Total A to maintain sitting balance.  OT - End of Session  Equipment Utilized During Treatment Oxygen  Activity Tolerance Patient limited by lethargy  Patient left in bed;with call bell/phone within reach  Nurse Communication Mobility status  OT Assessment  OT Recommendation/Assessment Patient needs continued OT Services  OT Visit Diagnosis Other abnormalities of gait and mobility (R26.89);Muscle weakness (generalized) (M62.81);Low vision, both eyes (H54.2);Feeding difficulties (R63.3);Other symptoms and  signs involving cognitive function;Cognitive communication deficit (R41.841)  OT Problem List Decreased strength;Decreased range of motion;Decreased activity tolerance;Impaired balance (sitting and/or standing);Impaired vision/perception;Decreased coordination;Decreased cognition;Decreased safety awareness;Decreased knowledge of use of DME or  AE;Decreased knowledge of precautions;Impaired UE functional use  AM-PAC OT "6 Clicks" Daily Activity Outcome Measure (Version 2)  Help from another person eating meals? 1  Help from another person taking care of personal grooming? 1  Help from another person toileting, which includes using toliet, bedpan, or urinal? 1  Help from another person bathing (including washing, rinsing, drying)? 1  Help from another person to put on and taking off regular upper body clothing? 1  Help from another person to put on and taking off regular lower body clothing? 1  6 Click Score 6  Progressive Mobility  What is the highest level of mobility based on the progressive mobility assessment? Level 1 (Bedfast) - Unable to balance while sitting on edge of bed  Mobility Sit up in bed/chair position for meals  OT Recommendation  Follow Up Recommendations Long-term institutional care without follow-up therapy  Assistance recommended at discharge Frequent or constant Supervision/Assistance  Functional Status Assessent Patient has had a recent decline in their functional status and demonstrates the ability to make significant improvements in function in a reasonable and predictable amount of time.  OT Equipment None recommended by OT  Individuals Consulted  Consulted and Agree with Results and Recommendations Patient  Acute Rehab OT Goals  Patient Stated Goal none stated  OT Goal Formulation With patient  OT Time Calculation  OT Start Time (ACUTE ONLY) 1048  OT Stop Time (ACUTE ONLY) 1112  OT Time Calculation (min) 24 min  OT General Charges  $OT Visit 1 Visit  OT  Evaluation  $OT Eval Moderate Complexity 1 Mod   Makynzie Dobesh C, OT/L  Acute Rehab [phone]

## 2021-04-28 NOTE — ED Notes (Signed)
Patient transported to MRI 

## 2021-04-28 NOTE — Consult Note (Addendum)
Attending physician's note   I have taken an interval history, reviewed the chart and discussed her care extensively on rounds. I agree with the Advanced Practitioner's note, impression, and recommendations as outlined.   60 year old female admitted from SNF with AMS, respiratory distress, and fever.  Diagnosed with COVID on admission labs.  Admission evaluation also notable for anemia with H/H 7.6/24.8 with MCV/RDW 91.5/16.  FOBT positive in the ER.  Was transfused 1 unit PRBCs with posttransfusion H/H 9.7/30.4.  Patient unable to provide any history and no records for review in EMR.  I called her sister and her daughter who assisted in her history.  Patient was recently hospitalized at [facility] for multiple strokes.  Currently on ASA and Eliquis.  Had PEG placed 1.5 weeks ago (daughter confirms this has internal balloon with pursestring suturing).  No prior known history of GI bleed.    Admission evaluation also notable for the following: - Normal B12/folate - Ferritin 18, iron 30, sat 8% - CT head with acute vs subacute infarct without hemorrhage, multiple chronic appearing infarcts, atrophy and mild chronic small vessel disease - MRI brain: Background atrophy with extensive ischemic changes, 2 cm acute infarct in left basal ganglia and internal capsule.  Small areas of acute infarction within both caudate lobes, cerebellum, and the thalami.  Old bilateral parietal infarcts.  1) Acute blood loss anemia 2) FOBT positive stool 3) CVA 4) Atrial fibrillation 5) Systemic anticoagulation - Discussed potential etiology for GI blood loss with patient's sister and daughter at length today.  Possibly related to recently placed PEG in the setting of antiplatelet/anticoagulation therapy.  Good response to PRBC transfusion and currently without overt bleeding.  Elevated BUN/creatinine ratio would be  more suggestive of UGI source, so if planning on endoscopic evaluation, would start with EGD in this patient with significant acute and chronic comorbidities - MRI brain with acute infarct.  Neurology service consulted. I discussed with patient's family at length today the increased risks of sedation/endoscopy given recent strokes.  Plan for conservative management for now, but if consulting Neurology service feels EGD needed to rule out high risk UGI lesion prior to restarting anticoagulation, can tentatively do EGD Thursday/Friday - Continue H/H trend - Holding Eliquis for now, but will need to be restarted in short order pending Neurology input - Hold PEG feeds for now - High-dose PPI - Agree that Palliative Care consult appropriate for this patient  6) COVID - Management per primary Family Medicine service  GI service will continue to follow  Gerrit Heck, DO, [place] [phone] office                                                                                   [facility] Consult: 8:17 AM 04/28/2021  LOS: 0 days    Referring Provider: Dr Erin Hearing  Primary Care Physician:  Benita Stabile attending at SNF, no outpt involvement Primary Gastroenterologist: unassigned     Reason for Consultation:  anemia, FOBT +     HPI: Jill Buchanan is a 60 y.o. female.  SNF resident for the past 1 to 2 weeks.  PMH congenital blindness.  A fib.  Multiple CVAs within the last month.  Chronic Eliquis/81 ASA.  S/p CABG nonverbally communicative.  S/p PEG No existing records of previous colonoscopy, EGD or other GI procedures/involvement.  In fact no pre-existing medical records exist at all  Transported from SNF with respiratory distress, AMS, fever.  Meeting SIRS criteria likely due to COVID-19 infection, though there is suspicion of aspiration as well.  On rectal exam had dark brown stool which was strongly FOBT positive COVID-19 PCR positive.  FOBT positive. Hgb 6.6  ... 1 PRBC ... 9.7.  MCV 88.  Platelets 191.  WBCs 10.6.  INR 1.1. Chemistries fairly normal although glucose in the 130s and albumin low at 3.0.  LDH elevated to 90.  Iron 30, low normal.  Ferritin 18.  B12, folate normal lactic acid, CRP normal. CT head with density in left basal ganglia suspicious for acute versus subacute infarct without hemorrhage.  Multiple chronic infarcts in various locations.  Atrophy, chronic small vessel ischemic changes.  Portable CXR unremarkable. MRI head ordered, not yet completed  Remdesivir, Decadron, PPI drip, cefepime, vancomycin initiated No reports of nausea, vomiting, abdominal pain but the existing records are scant.  Past Medical History:  Diagnosis Date   A-fib [facility])    CVA (cerebral vascular accident) [facility])     History reviewed. No pertinent surgical history.  Prior to Admission medications   Medication Sig Start Date End Date Taking? Authorizing Provider  acetaminophen (TYLENOL) 500 MG tablet Take 500 mg by mouth every 4 (four) hours as needed for mild pain.   Yes [provider]  amLODipine (NORVASC) 10 MG tablet Take 10 mg by mouth daily. 04/16/21  Yes [provider]  aspirin EC 81 MG tablet Take 81 mg by mouth daily.   Yes [provider]  atorvastatin (LIPITOR) 80 MG tablet Take 80 mg by mouth daily. 04/16/21  Yes [provider]  carvedilol (COREG) 25 MG tablet Take 25 mg by mouth 2 (two) times daily. 04/16/21  Yes [provider]  ELIQUIS 5 MG TABS tablet Take 5 mg by mouth 2 (two) times daily. 04/16/21  Yes [provider]  Multiple Vitamins-Minerals (MULTIVITAMIN WITH MINERALS) tablet Take 1 tablet by mouth daily.   Yes [provider]  polyethylene glycol (MIRALAX / GLYCOLAX) 17 g packet Take 17 g by mouth daily.   Yes [provider]  sennosides-docusate sodium (SENOKOT-S) 8.6-50 MG tablet Take 1 tablet by mouth daily.   Yes [provider]    Scheduled  Meds:  amLODipine  5 mg Per Tube Daily   atorvastatin  80 mg Per Tube Daily   carvedilol  25 mg Per Tube BID   chlorhexidine  15 mL Mouth Rinse BID   dexamethasone (DECADRON) injection  6 mg Intravenous Q24H   mouth rinse  15 mL Mouth Rinse q12n4p   polyethylene glycol  17 g Per Tube Daily   senna-docusate  1 tablet Per Tube Daily   Infusions:  ceFEPime (MAXIPIME) IV Stopped (04/28/21 0110)   pantoprazole 8 mg/hr (04/28/21 0418)   remdesivir 100 mg in NS 100 mL     vancomycin     PRN Meds: acetaminophen   Allergies as of 04/27/2021 - Review Complete 04/27/2021  Allergen Reaction Noted   Eggs or egg-derived products  04/27/2021    History reviewed. No pertinent family history.  Social History   Socioeconomic History   Marital status: Single    Spouse name: Not on file   Number of children: Not  on file   Years of education: Not on file   Highest education level: Not on file  Occupational History   Not on file  Tobacco Use   Smoking status: Not on file   Smokeless tobacco: Not on file  Substance and Sexual Activity   Alcohol use: Not on file   Drug use: Not on file   Sexual activity: Not on file  Other Topics Concern   Not on file  Social History Narrative   Not on file   Social Determinants of Health   Financial Resource Strain: Not on file  Food Insecurity: Not on file  Transportation Needs: Not on file  Physical Activity: Not on file  Stress: Not on file  Social Connections: Not on file  Intimate Partner Violence: Not on file    REVIEW OF SYSTEMS: Not able to obtain as the patient is nonverbal   PHYSICAL EXAM: Vital signs in last 24 hours: Vitals:   04/28/21 0200 04/28/21 0530  BP: (!) 147/100 (!) 118/59  Pulse: 67 (!) 50  Resp: (!) 21 (!) 21  Temp:    SpO2: 100% 100%   Wt Readings from Last 3 Encounters:  04/27/21 49.9 kg    General: Patient looks chronically ill, laying somewhat curled up in the bed.  Responsive to presence of others in  the room but does not follow commands Head: No signs of head trauma or facial asymmetry Eyes: No scleral icterus or conjunctival pallor Ears: Unable to assess hearing Nose: No discharge Mouth: Somewhat dry oral mucosa but it is clear.  Teeth in fair repair.  Neck: No masses, no JVD Lungs: Clear bilaterally.  No labored breathing or cough Heart: RRR.  No MRG.  S1, S2 present. Abdomen: Soft, nondistended, nontender.  No HSM, masses.  PEG site benign..   Rectal: Did not repeat rectal exam performed by ED and noted in the H&P. Musc/Skeltl: No joint redness, swelling.  Somewhat contracted posture. Extremities: No CCE Neurologic: Responds to noxious stimuli and opens eyes to voice. Skin: No rash, no sores, no suspicious lesions but dermatologic survey incomplete. Nodes: No cervical adenopathy  Intake/Output from previous day: 11/29 0701 - 11/30 0700 In: 1700 [IV Piggyback:1700] Out: -  Intake/Output this shift: No intake/output data recorded.  LAB RESULTS: Recent Labs    04/27/21 1143 04/27/21 1654 04/28/21 0337  WBC 10.6*  --  9.1  HGB 7.6* 6.6* 9.7*  HCT 24.8* 21.8* 30.4*  PLT 230  --  191   BMET Lab Results  Component Value Date   NA 139 04/28/2021   NA 146 (H) 04/27/2021   K 4.2 04/28/2021   K 4.3 04/27/2021   CL 106 04/28/2021   CL 111 04/27/2021   CO2 23 04/28/2021   CO2 25 04/27/2021   GLUCOSE 132 (H) 04/28/2021   GLUCOSE 131 (H) 04/27/2021   BUN 23 04/28/2021   BUN 32 (H) 04/27/2021   CREATININE 0.75 04/28/2021   CREATININE 0.96 04/27/2021   CALCIUM 9.4 04/28/2021   CALCIUM 9.6 04/27/2021   LFT Recent Labs    04/27/21 1143 04/28/21 0337  PROT 6.9 6.8  ALBUMIN 3.2* 3.0*  AST 29 30  ALT 22 19  ALKPHOS 70 68  BILITOT 0.7 1.1   PT/INR Lab Results  Component Value Date   INR 1.1 04/27/2021   Hepatitis Panel No results for input(s): HEPBSAG, HCVAB, HEPAIGM, HEPBIGM in the last 72 hours. C-Diff No components found for: CDIFF Lipase  No results  found for: LIPASE  Drugs of Abuse  No results found for: LABOPIA, COCAINSCRNUR, LABBENZ, AMPHETMU, THCU, LABBARB   RADIOLOGY STUDIES: CT HEAD WO CONTRAST (5MM)  Result Date: 04/27/2021 CLINICAL DATA:  Altered mental status EXAM: CT HEAD WITHOUT CONTRAST TECHNIQUE: Contiguous axial images were obtained from the base of the skull through the vertex without intravenous contrast. COMPARISON:  None. FINDINGS: Brain: No hemorrhage or intracranial mass. Atrophy and chronic small vessel ischemic changes of the white matter. Chronic appearing bilateral parietal and right temporal infarcts. Small chronic appearing infarcts within the bilateral basal ganglia. Chronic lacunar infarct in the right thalamus. Hypodensity within the left basal ganglia consistent with more acute or subacute infarct. Vascular: No hyperdense vessels.  Carotid vascular calcification Skull: No fracture Sinuses/Orbits: No acute finding. 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 Multiple coarse right globe calcifications. Other: None IMPRESSION: 1. Hypodensity within the left basal ganglia, suspicious for acute to subacute infarct. There is no hemorrhage. 2. Multiple chronic appearing infarcts within the bilateral basal ganglia, right thalamus, parietal lobes and right temporal lobe. 3. Atrophy and mild chronic small vessel ischemic changes of the white matter. Electronically Signed   By: Donavan Foil M.D.   On: 04/27/2021 21:39   DG Chest Port 1 View  Result Date: 04/27/2021 CLINICAL DATA:  Sepsis. EXAM: PORTABLE CHEST 1 VIEW COMPARISON:  None. FINDINGS: Previous median sternotomy and aortic valve repair heart size appears normal. No pleural effusion or interstitial edema. No airspace opacities identified. The visualized osseous structures are unremarkable. IMPRESSION: No acute cardiopulmonary abnormalities. Electronically Signed   By: Kerby Moors M.D.   On: 04/27/2021 12:42      IMPRESSION:     FOBT + normocytic anemia.   Borderline Iron  deficient.  Extent of any prior GI/endoscopic work-up unknown.  Excellent response to transfusion, Hgb up 3 g with 1 PRBC.      Covid 19 positive.  Hypoxia, SIRS/sepsis.  CXR without infiltrates  Mental status changes. CVAs w/in last month per documentation.  Current head CT showing possible acute CVA.   Chronic Eliquis on hold     Congenital blindness.  Dysphagia .  PEG in place.    PLAN:     Per Dr. Bryan Lemma, ?  EGD.  Probably best to hold off on colonoscopy for now.   Azucena Freed  04/28/2021, 8:17 AM Phone [phone]

## 2021-04-28 NOTE — Evaluation (Signed)
Physical Therapy Evaluation and Discharge Patient Details Name: Jill Buchanan MRN: [id number] DOB: [birth year] Today's Date: 04/28/2021  History of Present Illness  Pt is a 60 y/o female admitted 11/29 from SNF secondary to AMS and decreased responsiveness. Found to be COVID +. PMH includes Afib, CVA, blindness, CAD S/P CABG, Dysphagia with PEG tube.  Clinical Impression  Pt admitted secondary to problem above with deficits below. Pt nonverbal throughout session but would follow some commands for mobility tasks. Pt requiring max A +2 for bed mobility tasks and required total A to maintain sitting balance secondary to posterior lean. Pt currently lives at long term care facility and anticipate pt close to baseline. Recommend return to long term care facility at d/c. No further acute PT needs. Will sign off. If needs change, please re-consult.        Recommendations for follow up therapy are one component of a multi-disciplinary discharge planning process, led by the attending physician.  Recommendations may be updated based on patient status, additional functional criteria and insurance authorization.  Follow Up Recommendations Long-term institutional care without follow-up therapy    Assistance Recommended at Discharge Frequent or constant Supervision/Assistance  Functional Status Assessment Patient has had a recent decline in their functional status and demonstrates the ability to make significant improvements in function in a reasonable and predictable amount of time.  Equipment Recommendations  None recommended by PT    Recommendations for Other Services       Precautions / Restrictions Precautions Precautions: Fall Precaution Comments: peg tube Restrictions Weight Bearing Restrictions: No      Mobility  Bed Mobility Overal bed mobility: Needs Assistance Bed Mobility: Supine to Sit;Sit to Supine;Rolling Rolling: Max assist   Supine to sit: Max assist;+2 for physical  assistance Sit to supine: Max assist;+2 for physical assistance   General bed mobility comments: Max A +2 for trunk and LE assist to come to sitting. Pt with heavy posterior lean and requiring total A to maintain. Returned to supine and remainder of session focused on clean up.    Transfers                        Ambulation/Gait                  Stairs            Wheelchair Mobility    Modified Rankin (Stroke Patients Only)       Balance Overall balance assessment: Needs assistance Sitting-balance support: No upper extremity supported;Feet supported Sitting balance-Leahy Scale: Zero Sitting balance - Comments: heavy posterior lean. Total A to maintain sitting balance. Postural control: Posterior lean                                   Pertinent Vitals/Pain Pain Assessment: Faces Faces Pain Scale: Hurts a little bit Pain Location: generalized Pain Descriptors / Indicators: Grimacing;Guarding Pain Intervention(s): Limited activity within patient's tolerance;Monitored during session;Repositioned    Home Living Family/patient expects to be discharged to:: Skilled nursing facility                        Prior Function               Mobility Comments: Unsure of baseline, but anticipate she required significant assist for mobility tasks       Hand Dominance  Extremity/Trunk Assessment   Upper Extremity Assessment Upper Extremity Assessment: Defer to OT evaluation    Lower Extremity Assessment Lower Extremity Assessment: RLE deficits/detail;LLE deficits/detail RLE Deficits / Details: Able to perform heel slide and SLR. LLE Deficits / Details: Able to perform heel slide and SLR. Increased tightness in heel cords.    Cervical / Trunk Assessment Cervical / Trunk Assessment: Kyphotic  Communication   Communication: Expressive difficulties (not verbally responsive during session)  Cognition Arousal/Alertness:  Awake/alert Behavior During Therapy: Flat affect Overall Cognitive Status: No family/caregiver present to determine baseline cognitive functioning                                 General Comments: Follows some commands to assist with mobility tasks, but nonverbal throughout session        General Comments      Exercises     Assessment/Plan    PT Assessment All further PT needs can be met in the next venue of care  PT Problem List Decreased strength;Decreased activity tolerance;Decreased balance;Decreased mobility;Decreased knowledge of use of DME;Decreased knowledge of precautions       PT Treatment Interventions      PT Goals (Current goals can be found in the Care Plan section)  Acute Rehab PT Goals PT Goal Formulation: Patient unable to participate in goal setting Time For Goal Achievement: 04/28/21 Potential to Achieve Goals: Fair    Frequency     Barriers to discharge        Co-evaluation PT/OT/SLP Co-Evaluation/Treatment: Yes Reason for Co-Treatment: For patient/therapist safety;To address functional/ADL transfers;Complexity of the patient's impairments (multi-system involvement);Necessary to address cognition/behavior during functional activity PT goals addressed during session: Mobility/safety with mobility;Balance         AM-PAC PT "6 Clicks" Mobility  Outcome Measure Help needed turning from your back to your side while in a flat bed without using bedrails?: Total Help needed moving from lying on your back to sitting on the side of a flat bed without using bedrails?: Total Help needed moving to and from a bed to a chair (including a wheelchair)?: Total Help needed standing up from a chair using your arms (e.g., wheelchair or bedside chair)?: Total Help needed to walk in hospital room?: Total Help needed climbing 3-5 steps with a railing? : Total 6 Click Score: 6    End of Session Equipment Utilized During Treatment: Gait belt Activity  Tolerance: Patient tolerated treatment well Patient left: in bed;with call bell/phone within reach (on stretcher in ED) Nurse Communication: Mobility status PT Visit Diagnosis: Other abnormalities of gait and mobility (R26.89);Other symptoms and signs involving the nervous system (K93.818)    Time: 2993-7169 PT Time Calculation (min) (ACUTE ONLY): 24 min   Charges:   PT Evaluation $PT Eval Moderate Complexity: 1 Mod          Reuel Derby, PT, DPT  Acute Rehabilitation Services  Pager: [phone] Office: [phone]   Rudean Hitt 04/28/2021, 1:43 PM

## 2021-04-28 NOTE — Progress Notes (Signed)
Spoke with MRI tech about the STAT MRI that was ordered last night at 2344. Not yet completed 8 hours later. MRI tech reports they were unable to reach family to ensure safety and confirm absence of metal implants that would contraindicate MRI. MRI says MD cannot reach family and ask, it must be MRI. Also cannot rely solely on information MD obtained for H&P. MRI indicated if they cannot reach family they would have to scan patient with either XR or CT to confirm no hardware prior to obtaining MRI.   MD team was not notified of this delay overnight. This represents a significant delay in patient care. Will place safety zone portal to see if we can improve this process for patients in the future.   Fayette Pho, MD PGY-2, [facility] Family Medicine Service pager [phone]

## 2021-04-28 NOTE — Progress Notes (Addendum)
Family Medicine Teaching Service Daily Progress Note Intern Pager: [phone]  Patient name: Jill Buchanan Medical record number: [id number] Date of birth: [birth year] Age: 60 y.o. Gender: female  Primary Care Provider: Pcp, No Consultants: Gastroenterology, Dietician, Neruology Code Status: Full  Pt Overview and Major Events to Date:  11/30 - Patient Admitted  Assessment and Plan: Jill Buchanan is a 60 y.o. female presenting from SNF with AMS and fever, found to be COVID positive and anemic w/ FOBT positive . PMH is significant for A. 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     Sepsis secondary to COVID  Acute hypoxic respiratory failure Procalcitonin level normal, will d/c antibiotics. Patient afebrile with NGTD on Bcx and WBC of 9.1. Patient sating normal on room air.  -f/u Ucx -D/c Cefepime -D/c Vancomycin -Continue Decadron (Day 2/10) -Continue Remdesivir per pharmacy (Day 2/5) -Continue mIVF at 150 mL/hr LR -PT/OT  AMS Patient cooperative with night team, but patient did not respond to commands this morning. CT scan showed Hypodensity within the left basal ganglia consistent with more acute or subacute infarct. Awaiting MRI, radiology imaging patient for risk of implanted hardware. Patient has recent history of 5 strokes last month. -MRI Brain WO  Acute GI bleed w/ normocytic anemia S/p 2 units PRBC, Hgb 9.7 on recheck . LDH elevated at 290, Retic Ct slightly elevated at 5.7. Ferritin, Iron TIBC, folate and B12 normal. Anemia likely 2/2 GI bleed. GI considering EGD. -Continue Protonix 80 mg -GI following, appreciate recs -CBC daily  Dysphagia s/p CVA, PEG in place -Consult RD to start tube feeds   A. Fib on DOAC Patient rate controlled with HR 50-80s  -Continue Coreg 25 -Continuous Cardiac Monitoring  Constipation Moderate burden of stool throughout colon on Abdominal x-ray -Continue miralax 17 g BID -Continue senna 1 tablet  daily  HLD -Continue atorvastatin  FEN/GI: Tube Feeds PPx: SCDs Dispo:SNF pending clinical improvement .    Subjective:  NAEON, patient not following commands this morning.   Objective: Temp:  [97.6 F (36.4 C)-100.4 F (38 C)] 97.9 F (36.6 C) (11/29 2341) Pulse Rate:  [50-123] 50 (11/30 0530) Resp:  [15-28] 21 (11/30 0530) BP: (118-154)/(58-110) 118/59 (11/30 0530) SpO2:  [90 %-100 %] 100 % (11/30 0530) Weight:  [49.9 kg] 49.9 kg (11/29 1128) Physical Exam: General: Frail, elderly, white woman, NASD Cardiovascular: Irregularly irregular, NRMG Respiratory: CTABL Abdomen: Soft, non-distended Extremities: Moving all extremities independently  Laboratory: Recent Labs  Lab 04/27/21 1143 04/27/21 1654 04/28/21 0337  WBC 10.6*  --  9.1  HGB 7.6* 6.6* 9.7*  HCT 24.8* 21.8* 30.4*  PLT 230  --  191   Recent Labs  Lab 04/27/21 1143 04/28/21 0337  NA 146* 139  K 4.3 4.2  CL 111 106  CO2 25 23  BUN 32* 23  CREATININE 0.96 0.75  CALCIUM 9.6 9.4  PROT 6.9 6.8  BILITOT 0.7 1.1  ALKPHOS 70 68  ALT 22 19  AST 29 30  GLUCOSE 131* 132*      Imaging/Diagnostic Tests: CT HEAD WO CONTRAST ( )  Result Date: 04/27/2021 CLINICAL DATA:  Altered mental status EXAM: CT HEAD WITHOUT CONTRAST TECHNIQUE: Contiguous axial images were obtained from the base of the skull through the vertex without intravenous contrast. COMPARISON:  None. FINDINGS: Brain: No hemorrhage or intracranial mass. Atrophy and chronic small vessel ischemic changes of the white matter. Chronic appearing bilateral parietal and right temporal infarcts. Small chronic appearing infarcts within the bilateral  basal ganglia. Chronic lacunar infarct in the right thalamus. Hypodensity within the left basal ganglia consistent with more acute or subacute infarct. Vascular: No hyperdense vessels.  Carotid vascular calcification Skull: No fracture Sinuses/Orbits: No acute finding. 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 Multiple  coarse right globe calcifications. Other: None IMPRESSION: 1. Hypodensity within the left basal ganglia, suspicious for acute to subacute infarct. There is no hemorrhage. 2. Multiple chronic appearing infarcts within the bilateral basal ganglia, right thalamus, parietal lobes and right temporal lobe. 3. Atrophy and mild chronic small vessel ischemic changes of the white matter. Electronically Signed   By: Jasmine Pang M.D.   On: 04/27/2021 21:39   DG Pelvis Portable  Result Date: 04/28/2021 CLINICAL DATA:  Altered mental status EXAM: PORTABLE PELVIS 1-2 VIEWS COMPARISON:  None. FINDINGS: There is no evidence of pelvic fracture or diastasis. No pelvic bone lesions are seen. IMPRESSION: Negative. Electronically Signed   By: Jearld Lesch M.D.   On: 04/28/2021 09:16   DG Chest Port 1 View  Result Date: 04/27/2021 CLINICAL DATA:  Sepsis. EXAM: PORTABLE CHEST 1 VIEW COMPARISON:  None. FINDINGS: Previous median sternotomy and aortic valve repair heart size appears normal. No pleural effusion or interstitial edema. No airspace opacities identified. The visualized osseous structures are unremarkable. IMPRESSION: No acute cardiopulmonary abnormalities. Electronically Signed   By: Signa Kell M.D.   On: 04/27/2021 12:42   DG Abd Portable 1V  Result Date: 04/28/2021 CLINICAL DATA:  Altered mental status EXAM: PORTABLE ABDOMEN - 1 VIEW COMPARISON:  None. FINDINGS: The bowel gas pattern is normal. Moderate burden of stool throughout the colon. No obvious free air on supine radiographs. No radio-opaque calculi or other significant radiographic abnormality are seen. IMPRESSION: Nonobstructive pattern of bowel gas. Moderate burden of stool throughout the colon. Electronically Signed   By: Jearld Lesch M.D.   On: 04/28/2021 09:14     Bess Kinds, MD 04/28/2021, 6:30 AM PGY-1, [facility] Family Medicine FPTS Intern pager: [phone], text pages welcome

## 2021-04-28 NOTE — NC FL2 (Signed)
[place] MEDICAID FL2 LEVEL OF CARE SCREENING TOOL     IDENTIFICATION  Patient Name: Marni Franzoni Birthdate: [birth year] Sex: female Admission Date (Current Location): 04/27/2021  [facility] and [place] Number:  Producer, television/film/video and Address:  [facility], [address], [place], [place] [address]      Provider Number: [id number]  Attending Physician Name and Address:  Carney Living, MD  Relative Name and Phone Number:       Current Level of Care: Hospital Recommended Level of Care: Skilled Nursing Facility Prior Approval Number:    Date Approved/Denied:   PASRR Number:    Discharge Plan: SNF    Current Diagnoses: Patient Active Problem List   Diagnosis Date Noted   Stroke [facility]) 04/28/2021   COVID-19 virus infection    Heme positive stool    Acute blood loss anemia    Fever 04/27/2021    Orientation RESPIRATION BLADDER Height & Weight      (Disoriented x4)  O2 (Nasal cannula 2L) Incontinent, External catheter Weight: 104 lb 11.5 oz (47.5 kg) Height:  5\' 3"  (160 cm)  BEHAVIORAL SYMPTOMS/MOOD NEUROLOGICAL BOWEL NUTRITION STATUS      Incontinent Feeding tube  AMBULATORY STATUS COMMUNICATION OF NEEDS Skin   Extensive Assist Verbally                         Personal Care Assistance Level of Assistance  Bathing, Feeding, Dressing Bathing Assistance: Maximum assistance Feeding assistance: Maximum assistance Dressing Assistance: Maximum assistance     Functional Limitations Info  Sight Sight Info: Impaired        SPECIAL CARE FACTORS FREQUENCY                       Contractures Contractures Info: Not present    Additional Factors Info  Code Status, Allergies, Isolation Precautions Code Status Info: Full Allergies Info: Eggs     Isolation Precautions Info: COVID+     Current Medications (04/28/2021):  This is the current hospital active medication list Current Facility-Administered Medications   Medication Dose Route Frequency Provider Last Rate Last Admin   acetaminophen (TYLENOL) tablet 500 mg  500 mg Per Tube Q4H PRN 04/30/2021, MD       amLODipine (NORVASC) tablet 5 mg  5 mg Per Tube Daily Fayette Pho, MD   5 mg at 04/28/21 1128   atorvastatin (LIPITOR) tablet 80 mg  80 mg Per Tube Daily 04/30/21, MD   80 mg at 04/28/21 1127   carvedilol (COREG) tablet 25 mg  25 mg Per Tube BID 04/30/21, MD   25 mg at 04/28/21 1128   chlorhexidine (PERIDEX) 0.12 % solution 15 mL  15 mL Mouth Rinse BID 04/30/21, MD   15 mL at 04/28/21 1127   dexamethasone (DECADRON) injection 6 mg  6 mg Intravenous Q24H 04/30/21, MD   6 mg at 04/27/21 1837   lactated ringers infusion   Intravenous Continuous 04/29/21, MD 150 mL/hr at 04/28/21 1135 New Bag at 04/28/21 1135   MEDLINE mouth rinse  15 mL Mouth Rinse q12n4p 04/30/21, MD   15 mL at 04/28/21 1150   pantoprozole (PROTONIX) 80 mg /NS 100 mL infusion  8 mg/hr Intravenous Continuous 04/30/21, MD 10 mL/hr at 04/28/21 0418 8 mg/hr at 04/28/21 0418   polyethylene glycol (MIRALAX / GLYCOLAX) packet 17 g  17 g Per Tube BID 04/30/21,  MD       remdesivir 100 mg in sodium chloride 0.9 % 100 mL IVPB  100 mg Intravenous Daily Earnie Larsson, [facility]   Stopped at 04/28/21 1244   senna-docusate (Senokot-S) tablet 1 tablet  1 tablet Per Tube Daily Fayette Pho, MD   1 tablet at 04/28/21 1128     Discharge Medications: Please see discharge summary for a list of discharge medications.  Relevant Imaging Results:  Relevant Lab Results:   Additional Information SSN: [id number] [place], [place]

## 2021-04-29 ENCOUNTER — Inpatient Hospital Stay (HOSPITAL_COMMUNITY): Payer: Medicaid Other

## 2021-04-29 DIAGNOSIS — I6389 Other cerebral infarction: Secondary | ICD-10-CM

## 2021-04-29 DIAGNOSIS — I63413 Cerebral infarction due to embolism of bilateral middle cerebral arteries: Secondary | ICD-10-CM

## 2021-04-29 DIAGNOSIS — R195 Other fecal abnormalities: Secondary | ICD-10-CM | POA: Diagnosis not present

## 2021-04-29 DIAGNOSIS — I639 Cerebral infarction, unspecified: Secondary | ICD-10-CM | POA: Diagnosis not present

## 2021-04-29 DIAGNOSIS — I4819 Other persistent atrial fibrillation: Secondary | ICD-10-CM

## 2021-04-29 DIAGNOSIS — K922 Gastrointestinal hemorrhage, unspecified: Secondary | ICD-10-CM

## 2021-04-29 DIAGNOSIS — D62 Acute posthemorrhagic anemia: Secondary | ICD-10-CM | POA: Diagnosis not present

## 2021-04-29 DIAGNOSIS — U071 COVID-19: Secondary | ICD-10-CM | POA: Diagnosis not present

## 2021-04-29 LAB — ECHOCARDIOGRAM COMPLETE
AR max vel: 0.4 cm2
AV Area VTI: 0.46 cm2
AV Area mean vel: 0.37 cm2
AV Mean grad: 4.5 mmHg
AV Peak grad: 9.5 mmHg
Ao pk vel: 1.54 m/s
Area-P 1/2: 2.74 cm2
Height: 63 in
MV VTI: 0.23 cm2
S' Lateral: 1.9 cm
Weight: 1735.46 oz

## 2021-04-29 LAB — BASIC METABOLIC PANEL
Anion gap: 11 (ref 5–15)
BUN: 29 mg/dL — ABNORMAL HIGH (ref 8–23)
CO2: 24 mmol/L (ref 22–32)
Calcium: 9 mg/dL (ref 8.9–10.3)
Chloride: 100 mmol/L (ref 98–111)
Creatinine, Ser: 0.83 mg/dL (ref 0.44–1.00)
GFR, Estimated: >60 mL/min (ref >60)
Glucose, Bld: 130 mg/dL — ABNORMAL HIGH (ref 70–99)
Potassium: 3.7 mmol/L (ref 3.5–5.1)
Sodium: 135 mmol/L (ref 135–145)

## 2021-04-29 LAB — HEMOGLOBIN A1C
Hgb A1c MFr Bld: 4.9 % (ref 4.8–5.6)
Mean Plasma Glucose: 93.93 mg/dL

## 2021-04-29 LAB — GLUCOSE, CAPILLARY
Glucose-Capillary: 113 mg/dL — ABNORMAL HIGH (ref 70–99)
Glucose-Capillary: 117 mg/dL — ABNORMAL HIGH (ref 70–99)
Glucose-Capillary: 120 mg/dL — ABNORMAL HIGH (ref 70–99)
Glucose-Capillary: 127 mg/dL — ABNORMAL HIGH (ref 70–99)
Glucose-Capillary: 165 mg/dL — ABNORMAL HIGH (ref 70–99)
Glucose-Capillary: 166 mg/dL — ABNORMAL HIGH (ref 70–99)

## 2021-04-29 LAB — CBC
HCT: 28.3 % — ABNORMAL LOW (ref 36.0–46.0)
Hemoglobin: 9.4 g/dL — ABNORMAL LOW (ref 12.0–15.0)
MCH: 27.8 pg (ref 26.0–34.0)
MCHC: 33.2 g/dL (ref 30.0–36.0)
MCV: 83.7 fL (ref 80.0–100.0)
Platelets: 194 10*3/uL (ref 150–400)
RBC: 3.38 MIL/uL — ABNORMAL LOW (ref 3.87–5.11)
RDW: 15 % (ref 11.5–15.5)
WBC: 8.9 10*3/uL (ref 4.0–10.5)
nRBC: 0 % (ref 0.0–0.2)

## 2021-04-29 LAB — LIPID PANEL
Cholesterol: 211 mg/dL — ABNORMAL HIGH (ref 0–200)
HDL: 42 mg/dL (ref >40)
LDL Cholesterol: 155 mg/dL — ABNORMAL HIGH (ref 0–99)
Total CHOL/HDL Ratio: 5 RATIO
Triglycerides: 72 mg/dL (ref <150)
VLDL: 14 mg/dL (ref 0–40)

## 2021-04-29 LAB — HEMOGLOBIN AND HEMATOCRIT, BLOOD
HCT: 27.5 % — ABNORMAL LOW (ref 36.0–46.0)
Hemoglobin: 9.2 g/dL — ABNORMAL LOW (ref 12.0–15.0)

## 2021-04-29 MED ORDER — APIXABAN 5 MG PO TABS
5.0000 mg | ORAL_TABLET | Freq: Two times a day (BID) | ORAL | Status: DC
  Administered 2021-04-30 (×2): 5 mg
  Filled 2021-04-29: qty 1

## 2021-04-29 MED ORDER — ASPIRIN 81 MG PO CHEW
81.0000 mg | CHEWABLE_TABLET | Freq: Every day | ORAL | Status: DC
  Administered 2021-04-30 – 2021-05-02 (×4): 81 mg
  Filled 2021-04-29 (×4): qty 1

## 2021-04-29 MED ORDER — APIXABAN 5 MG PO TABS
5.0000 mg | ORAL_TABLET | Freq: Two times a day (BID) | ORAL | Status: DC
  Administered 2021-04-29: 5 mg via ORAL
  Filled 2021-04-29 (×2): qty 1

## 2021-04-29 MED ORDER — PANTOPRAZOLE SODIUM 40 MG IV SOLR
40.0000 mg | INTRAVENOUS | Status: DC
  Administered 2021-04-29 – 2021-04-30 (×2): 40 mg via INTRAVENOUS
  Filled 2021-04-29 (×2): qty 40

## 2021-04-29 MED ORDER — PROSOURCE TF PO LIQD
45.0000 mL | Freq: Every day | ORAL | Status: DC
  Administered 2021-04-29 – 2021-05-02 (×4): 45 mL
  Filled 2021-04-29 (×4): qty 45

## 2021-04-29 MED ORDER — OSMOLITE 1.2 CAL PO LIQD
1000.0000 mL | ORAL | Status: DC
  Administered 2021-04-29 – 2021-05-01 (×3): 1000 mL
  Filled 2021-04-29 (×5): qty 1000

## 2021-04-29 MED ORDER — ASPIRIN 81 MG PO CHEW: 81.0000 mg | CHEWABLE_TABLET | Freq: Every day | ORAL | Status: DC

## 2021-04-29 MED ORDER — FREE WATER
240.0000 mL | Freq: Four times a day (QID) | Status: DC
Start: 2021-04-29 — End: 2021-05-01
  Administered 2021-04-29 – 2021-05-01 (×8): 240 mL

## 2021-04-29 MED ORDER — ASPIRIN EC 81 MG PO TBEC
81.0000 mg | DELAYED_RELEASE_TABLET | Freq: Every day | ORAL | Status: DC
  Filled 2021-04-29: qty 1

## 2021-04-29 NOTE — Progress Notes (Signed)
Initial Nutrition Assessment  DOCUMENTATION CODES:   Non-severe (moderate) malnutrition in context of acute illness/injury  INTERVENTION:   Tube Feed via PEG: Increase Osmolite 1.2 to 55 mL/hr (1320 mL/day) 45 mL ProSource TF - Daily 130 mL free water flushes q6h   Provides 1624 kcal, 84 gm PRO, 1082 mL free water (1602 mL total free water)  NUTRITION DIAGNOSIS:   Moderate Malnutrition related to acute illness as evidenced by severe muscle depletion, moderate fat depletion.  GOAL:   Patient will meet greater than or equal to 90% of their needs  MONITOR:   TF tolerance, Labs  REASON FOR ASSESSMENT:   Consult Enteral/tube feeding initiation and management, Assessment of nutrition requirement/status  ASSESSMENT:   60 y.o. female presented to the ED from her SNF with change in mental and respiratory status. PMH includes blindness, CVA,  and chronic dysphagia - PEG dependent. Pt admitted with sepsis 2/2 COVID-19, altered mental status, and acute hypoxic respiratory failure.   Unsure of home tube feed regimen the pt is on, no record per home meds.   Pt does not wake to RD voice or touch.  No family at bedside.   No weight history in Epic or Care Everywhere.  Medications reviewed and include: Dexamethasone, Protonix, Miralax, Senokot Labs reviewed: 24 hr BG trends 93-165  NUTRITION - FOCUSED PHYSICAL EXAM:  Flowsheet Row Most Recent Value  Orbital Region Moderate depletion  Upper Arm Region Mild depletion  Thoracic and Lumbar Region Mild depletion  Buccal Region Moderate depletion  Temple Region Moderate depletion  Clavicle Bone Region Moderate depletion  Clavicle and Acromion Bone Region Moderate depletion  Scapular Bone Region Moderate depletion  Dorsal Hand Unable to assess  [Mitts]  Patellar Region Severe depletion  Anterior Thigh Region Severe depletion  Posterior Calf Region Severe depletion  Edema (RD Assessment) None  Hair Reviewed  Eyes Unable to  assess  Mouth Unable to assess  Skin Reviewed  Nails Reviewed       Diet Order:   Diet Order             Diet NPO time specified  Diet effective now                   EDUCATION NEEDS:   No education needs have been identified at this time  Skin:  Skin Assessment: Reviewed RN Assessment  Last BM:  04/28/2021  Height:   Ht Readings from Last 1 Encounters:  04/28/21 5\' 3"  (1.6 m)    Weight:   Wt Readings from Last 1 Encounters:  04/29/21 49.2 kg    Ideal Body Weight:  52.3 kg  BMI:  Body mass index is 19.21 kg/m.  Estimated Nutritional Needs:   Kcal:  1600-1800  Protein:  80-95 grams  Fluid:  > 1.6 L    Chemeka Filice BS, PLDN Clinical Dietitian See AMiON for contact information.

## 2021-04-29 NOTE — Progress Notes (Signed)
FPTS Brief Progress Note  S: Patient continues to be non-verbal. Mittens placed b/l. Discussed with RN who voiced no concerns at this time.   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 Mittens placed b/l. She is calm.  Resp: Normal respiratory effort   A/P: COVID-19 Infection Afebrile today.  -Continue treatment with Remdesivir and Decadron.   Hypertension  Acute left basal ganglia and internal capsule infarction On amlodipine and carvedilol. BP still elevated, 163/111 at this time. Will not intervene at this time given concerns for acute CVA. Will gradually normalize.  -Can consider addition of ACE-I/ARB in the next several days if still elevated.   - Orders reviewed. Labs for AM ordered, which was adjusted as needed.   , DO 04/29/2021, 12:13 AM PGY-2, [facility] Night Resident  Please page [phone] with questions.

## 2021-04-29 NOTE — Hospital Course (Addendum)
Jill Buchanan is a 60 y.o.female with a history of A. 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 Her hospital course is detailed below:  Sepsis secondary to COVID  acute hypoxic respiratory failure Presented from SNF via EMS with AMS and fever (100.4), tachypnea, hypoxemia @90 %. Code sepsis activated, given LR bolus, cultures drawn, started on IV cefepime and Vancomycin. Anemic at 7.6 with FOBT+, found to be COVID+. CXR, UA unremarkable. Started on Decadron and Remdesivir in addition to mIVF due to presenting hypovolemic.  Over the next few days her symptoms improved and her hypoxia decreased.  She was weaned off of oxygen on day 5 of the illness.  She completed her course of remdesivir prior to discharge and completed 6/10 days of Decadron prior to discharge.  She will complete the 10-day treatment course at SNF. AMS Alertness and responsiveness waxes and wanes, non-verbal at baseline due to CVA x5 in previous month. CT head suspicious for acute to subacute infarct. MR brain showed atrophy and extensive ischemic changes throughout. 2 cm region of acute infarction within the left basal ganglia and internal capsule. Smaller areas of acute infarction within both caudate bodies, the cerebellum and the thalami. Old bilateral parietal cortical and subcortical infarctions. There could possibly be some acute infarction adjacent to the left parietal old infarction. This pattern of disease could be due to global hypoperfusion, embolic disease or vasculitis. Neurology was consulted and there was discussion regarding initiating Pradaxa.  The patient has already failed Xarelto.  Due to the fact that the patient cannot swallow she is not a candidate for Pradaxa so Eliquis and 81 mg aspirin were started.  Anemia likely 2/2 GI bleed Hgb 7.6 on admission. Received 2u pRBC transfusion total.  GI was  consulted and they recommended monitoring for stable hemoglobin and once hemoglobin was stable restarting anticoagulation due to stroke risk.  Recommended observation rather than diagnostic EGD and if bleeding started they could plan for therapeutic EGD.  She was given dose of IV iron and pantoprazole.  Her hemoglobin remained stable throughout the rest of the hospitalization after reinitiation of anticoagulation.  Goals of care discussion Palliative care was consulted during the hospitalization.  Extensive discussion occurred between neurology, palliative care, primary team, and the patient and it was decided for the patient to move to DNR status.  CODE STATUS has been changed.  Other chronic conditions were medically managed with home medications and formulary alternatives as necessary (A. Fib, HLD)  PCP Follow-up Recommendations: Recommend repeat BMP in 1 week to assess sodium levels.  We were giving free water intermittently throughout the hospitalization and noticed downtrending sodium to 131 prior to discharge. Discussed patient's discharge with palliative care and they report that someone will contact the patient's daughter to line up outpatient palliative care meeting Patient will need to continue Decadron after discharge with her last dose being on 12/8

## 2021-04-29 NOTE — Progress Notes (Addendum)
Family Medicine Teaching Service Daily Progress Note Intern Pager: [phone]  Patient name: Jill Buchanan Medical record number: [id number] Date of birth: 1960/08/08 Age: 60 y.o. Gender: female  Primary Care Provider: Pcp, No Consultants: Gastroenterology, Nutrition, Neuro Code Status: Full  Pt Overview and Major Events to Date:  11/29- admitted  Assessment and Plan: Jill Buchanan is a 60 y.o. female presenting from SNF with AMS and fever, found to be COVID positive and anemic w/ FOBT positive. Now also found to have acute CVA. PMH is significant for A. 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    Acute Hypoxic Respiratory Failure 2/2 COVID-19 infection Met sepsis criteria on admission Afebrile. On 2L [place] since 0900 yesterday. Blood and urine cx with NGTD. WBC stable at 8.9. Per daughter's report, she has not had any Covid vaccines.  - Continue remdesivir (3/5) - Continue decadron (3/10) - PT/OT both signed off  AMS Acute CVA MRI yesterday with background pattern of atrophy and extensive ischemic changes throughout the brain. 2cm region of acute infarction within the left basal ganglia and internal capsule. Patient has recent history of 5 strokes just last month. Mental status today unchanged from yesterday. Attempts to open eyes to voice but does not follow commands.   Called Dr. Erlinda Hong, neurology, to consult on patient.  Discussed poor prognosis with [name] Jill Buchanan who understands the prognosis and is amenable to a palliative consult. Her main concern at this point is determining a disposition plan for her mother. She would be interested in pursuing hospice options and would like to make her mother's code status DNR.   - Palliative care consult - Dr. Erlinda Hong, neurology to see, follow-up recommendations - Continue amlodipine 49m daily and coreg 23mBID  Acute blood loss anemia Hgb stable at 9.4 s/p 2u PRBC on 11/29. Seen by GI yesterday who opted  for conservative management in the setting of high risks with sedation/endoscopy, but if EGD indicated prior to restarting anticoagulation, could potentially do today/tomorrow. Awaiting neurology's input on anticoagulation.  - Holding anticoagulants pending neuro consult  Dysphagia s/p CVA, PEG in place Started tube feeds yesterday. Euvolemic on exam today.  - Tube feeds per nutrition recs - Will add free water flushes to ensure adequate hydration - D/c IVF  A Fib on DOAC Patient is rate controlled on Coreg 25 - holding eliquis as above - Continuous cardiac monitoring  Constipation 2x stool yesterday.  - Continue Miralax BID and senna daily  HLD - Continue atorvastatin  FEN/GI: Tube feeds PPx: SCDs Dispo: Long-term care facility  pending clinical improvement .    Subjective:  Minimally responsive.   Objective: Temp:  [97.6 F (36.4 C)-98 F (36.7 C)] 97.8 F (36.6 C) (12/01 0743) Pulse Rate:  [41-75] 71 (12/01 0743) Resp:  [14-20] 20 (12/01 0743) BP: (126-171)/(61-133) 133/64 (12/01 0743) SpO2:  [95 %-100 %] 97 % (12/01 0743) Weight:  [47.5 kg-49.2 kg] 49.2 kg (12/01 0424) Physical Exam: General: Chronically ill appearing, NAD. Minimally responsive.  Cardiovascular: Irregularly irregular, bradycardic, no m/r/g Respiratory: Clear to auscultation bilaterally, nl WOB on 2L [place] Abdomen: Non-distended, PEG in place without erythema/drainage Extremities: Contracted UE Neuro: Withdraws to painful stimuli, attempts to open eyes to voice, does not follow commands    Laboratory: Recent Labs  Lab 04/27/21 1143 04/27/21 1654 04/28/21 0337 04/28/21 1259 04/29/21 0244  WBC 10.6*  --  9.1  --  8.9  HGB 7.6*   < > 9.7* 9.5* 9.4*  HCT 24.8*   < > 30.4* 30.8* 28.3*  PLT 230  --  191  --  194   < > = values in this interval not displayed.   Recent Labs  Lab 04/27/21 1143 04/28/21 0337 04/29/21 0244  NA 146* 139 135  K 4.3 4.2 3.7  CL 111 106 100  CO2 _0 BUN 32*  23 29*  CREATININE 0.96 0.75 0.83  CALCIUM 9.6 9.4 9.0  PROT 6.9 6.8  --   BILITOT 0.7 1.1  --   ALKPHOS 70 68  --   ALT 22 19  --   AST 29 30  --   GLUCOSE 131* 132* 130*     Imaging/Diagnostic Tests: MRI Brain IMPRESSION: Background pattern of atrophy and extensive ischemic changes throughout the brain. 2 cm region of acute infarction within the left basal ganglia and internal capsule. Smaller areas of acute infarction within both caudate bodies, the cerebellum and the thalami. Old bilateral parietal cortical and subcortical infarctions with what I think is some T2 shine through. There could possibly be some acute infarction adjacent to the left parietal old infarction. This pattern of disease could be due to global hypoperfusion, embolic disease or vasculitis.  Jill Gibson, MD 04/29/2021, 8:47 AM PGY-1, [place] Intern pager: [phone], text pages welcome

## 2021-04-29 NOTE — Progress Notes (Signed)
[facility] ROUNDING NOTE   Subjective: No acute events overnight.  H/H stable at 9.4/28 without any reported overt bleeding overnight.  Objective: Vital signs in last 24 hours: Temp:  [97.6 F (36.4 C)-98 F (36.7 C)] 97.6 F (36.4 C) (12/01 1225) Pulse Rate:  [47-75] 52 (12/01 1225) Resp:  [16-20] 17 (12/01 1225) BP: (123-171)/(61-111) 123/61 (12/01 1225) SpO2:  [96 %-99 %] 99 % (12/01 1225) Weight:  [49.2 kg] 49.2 kg (12/01 0424) Last BM Date: 04/28/21    Intake/Output from previous day: 11/30 0701 - 12/01 0700 In: 3085.8 [I.V.:2609.2; NG/GT:276.6; IV Piggyback:200] Out: 1200 [Urine:1200] Intake/Output this shift: No intake/output data recorded.   Lab Results: Recent Labs    04/27/21 1143 04/27/21 1654 04/28/21 0337 04/28/21 1259 04/29/21 0244  WBC 10.6*  --  9.1  --  8.9  HGB 7.6*   < > 9.7* 9.5* 9.4*  PLT 230  --  191  --  194  MCV 91.5  --  88.6  --  83.7   < > = values in this interval not displayed.   BMET Recent Labs    04/27/21 1143 04/28/21 0337 04/29/21 0244  NA 146* 139 135  K 4.3 4.2 3.7  CL 111 106 100  CO2 25 23 24   GLUCOSE 131* 132* 130*  BUN 32* 23 29*  CREATININE 0.96 0.75 0.83  CALCIUM 9.6 9.4 9.0   LFT Recent Labs    04/27/21 1143 04/28/21 0337  PROT 6.9 6.8  ALBUMIN 3.2* 3.0*  AST 29 30  ALT 22 19  ALKPHOS 70 68  BILITOT 0.7 1.1   PT/INR Recent Labs    04/27/21 1143  INR 1.1      Imaging/Other results: CT HEAD WO CONTRAST (04/29/21)  Result Date: 04/27/2021 CLINICAL DATA:  Altered mental status EXAM: CT HEAD WITHOUT CONTRAST TECHNIQUE: Contiguous axial images were obtained from the base of the skull through the vertex without intravenous contrast. COMPARISON:  None. FINDINGS: Brain: No hemorrhage or intracranial mass. Atrophy and chronic small vessel ischemic changes of the white matter. Chronic appearing bilateral parietal and right temporal infarcts. Small chronic appearing infarcts within the bilateral  basal ganglia. Chronic lacunar infarct in the right thalamus. Hypodensity within the left basal ganglia consistent with more acute or subacute infarct. Vascular: No hyperdense vessels.  Carotid vascular calcification Skull: No fracture Sinuses/Orbits: No acute finding. 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 Multiple coarse right globe calcifications. Other: None IMPRESSION: 1. Hypodensity within the left basal ganglia, suspicious for acute to subacute infarct. There is no hemorrhage. 2. Multiple chronic appearing infarcts within the bilateral basal ganglia, right thalamus, parietal lobes and right temporal lobe. 3. Atrophy and mild chronic small vessel ischemic changes of the white matter. Electronically Signed   By: 04/29/2021 M.D.   On: 04/27/2021 21:39   MR BRAIN WO CONTRAST  Result Date: 04/28/2021 CLINICAL DATA:  Neuro deficit, acute, stroke suspected. COVID sepsis. Decreased responsiveness. Blindness and dysarthria. EXAM: MRI HEAD WITHOUT CONTRAST TECHNIQUE: Multiplanar, multiecho pulse sequences of the brain and surrounding structures were obtained without intravenous contrast. COMPARISON:  Head CT yesterday. FINDINGS: Brain: There is a background pattern of chronic ischemic changes affecting the brainstem, cerebellum, thalami, basal ganglia, cerebral hemispheric white matter and right inferior parietal cortical brain and left parietal cortical brain. There are numerous foci of restricted diffusion consistent with acute infarctions. The largest area affects the left basal ganglia and internal capsule, measuring about 2 cm in size. Other smaller acute infarctions affect  the middle cerebellar peduncle region on the right, punctate focus in the left cerebellum, smudgy foci within both thalami, areas of acute infarction within the deep white matter and both caudate bodies. I think there is T2 shine through associated with old bilateral parietal cortical and subcortical infarctions. No evidence of hemorrhage. No  hydrocephalus or extra-axial collection. Vascular: Major vessels at the base of the brain show flow. Skull and upper cervical spine: Negative Sinuses/Orbits: Sinuses are clear.  Bilateral phthisis bulbi. Other: None IMPRESSION: Background pattern of atrophy and extensive ischemic changes throughout the brain. 2 cm region of acute infarction within the left basal ganglia and internal capsule. Smaller areas of acute infarction within both caudate bodies, the cerebellum and the thalami. Old bilateral parietal cortical and subcortical infarctions with what I think is some T2 shine through. There could possibly be some acute infarction adjacent to the left parietal old infarction. This pattern of disease could be due to global hypoperfusion, embolic disease or vasculitis. Electronically Signed   By: Nelson Chimes M.D.   On: 04/28/2021 10:59   DG Pelvis Portable  Result Date: 04/28/2021 CLINICAL DATA:  Altered mental status EXAM: PORTABLE PELVIS 1-2 VIEWS COMPARISON:  None. FINDINGS: There is no evidence of pelvic fracture or diastasis. No pelvic bone lesions are seen. IMPRESSION: Negative. Electronically Signed   By: Delanna Ahmadi M.D.   On: 04/28/2021 09:16   DG Abd Portable 1V  Result Date: 04/28/2021 CLINICAL DATA:  Altered mental status EXAM: PORTABLE ABDOMEN - 1 VIEW COMPARISON:  None. FINDINGS: The bowel gas pattern is normal. Moderate burden of stool throughout the colon. No obvious free air on supine radiographs. No radio-opaque calculi or other significant radiographic abnormality are seen. IMPRESSION: Nonobstructive pattern of bowel gas. Moderate burden of stool throughout the colon. Electronically Signed   By: Delanna Ahmadi M.D.   On: 04/28/2021 09:14      Assessment and Plan:  1) Acute blood loss anemia 2) FOBT positive stool 3) CVA 4) Atrial fibrillation 5) Systemic anticoagulation  - I again discussed her care with her daughter by phone today.  Patient was evaluated by the Neurology  service with plan to resume anticoagulation.  As she is otherwise without overt bleeding and H/H stable, I agree with plan to restart anticoagulation and observation rather than largely diagnostic EGD before hand.  Certainly if she were to develop bleeding we could plan for EGD for therapeutic intent.  I discussed all of this with her daughter to include the risk/benefit profile of EGD, anticoagulation, etc. In her current condition.  Her daughter agrees with that plan. - Restarting anticoagulation per Neurology and primary Medicine service and observe for e/o rebleed - Daily H/H trend - Okay to use PEG for tube feeds - Agree with Palliative Care consult as discussed with family - Continued CVA work-up per Neurology and Medicine services - Would likely benefit from dose of IV iron  6) COVID - Management per primary Family Medicine service  GI service will sign off at this time.  Please do not hesitate to contact inpatient GI team with additional questions or concerns, particularly if concern for bleeding after restarting anticoagulation    Lavena Bullion, DO  04/29/2021, 2:20 PM [facility] Pager [phone]

## 2021-04-29 NOTE — Progress Notes (Signed)
2D echocardiogram completed.  04/29/2021 3:28 PM Eula Fried., MHA, RVT, RDCS, RDMS

## 2021-04-29 NOTE — Progress Notes (Addendum)
STROKE TEAM PROGRESS NOTE   INTERVAL HISTORY Patient was seen in her room with no family at the bedside.  She was admitted on 11/29 from the SNF where she resides with altered mental status, COVID-19 infection and GI bleed.  She was found to have a left basal ganglia stroke (of note, patient was recently hospitalized at [facility] with 5 strokes per her daughter) and is now undergoing stroke workup.  Discussed patient's baseline and current condition with her daughter [name] over the phone.  Vitals:   04/29/21 0400 04/29/21 0424 04/29/21 0743 04/29/21 1225  BP: 126/61  133/64 123/61  Pulse: (!) 47  71 (!) 52  Resp: 16  20 17   Temp: 98 F (36.7 C)  97.8 F (36.6 C) 97.6 F (36.4 C)  TempSrc: Axillary  Axillary Axillary  SpO2: 99%  97% 99%  Weight:  49.2 kg    Height:       CBC:  Recent Labs  Lab 04/27/21 1143 04/27/21 1654 04/28/21 0337 04/28/21 1259 04/29/21 0244  WBC 10.6*  --  9.1  --  8.9  NEUTROABS 8.2*  --   --   --   --   HGB 7.6*   < > 9.7* 9.5* 9.4*  HCT 24.8*   < > 30.4* 30.8* 28.3*  MCV 91.5  --  88.6  --  83.7  PLT 230  --  191  --  194   < > = values in this interval not displayed.   Basic Metabolic Panel:  Recent Labs  Lab 04/28/21 0337 04/29/21 0244  NA 139 135  K 4.2 3.7  CL 106 100  CO2 23 24  GLUCOSE 132* 130*  BUN 23 29*  CREATININE 0.75 0.83  CALCIUM 9.4 9.0   Lipid Panel:  Recent Labs  Lab 04/29/21 0244  CHOL 211*  TRIG 72  HDL 42  CHOLHDL 5.0  VLDL 14  LDLCALC 155*   HgbA1c:  Recent Labs  Lab 04/29/21 0244  HGBA1C 4.9   Urine Drug Screen: No results for input(s): LABOPIA, COCAINSCRNUR, LABBENZ, AMPHETMU, THCU, LABBARB in the last 168 hours.  Alcohol Level No results for input(s): ETH in the last 168 hours.  IMAGING past 24 hours No results found.  PHYSICAL EXAM General: Thin, chronically ill appearing female in no acute distress  Cardiac: atrial fibrillation seen on monitor  Neurological:  Patient does  not respond to voice or follow commands.  Unable to check pupils or corneal reflexes due to congenital blindness.  Patient is able to move all extremities nonpurposefully, L>R.  She will withdraw to noxious stimulus but cannot localize.  ASSESSMENT/PLAN Ms. Genises Scaduto is a 60 y.o. female with history of multiple strokes, congenital blindness, atrial fibrillation, CAD s/p CABG, and dysphagia with PEG tube presenting with altered mental status and respiratory distress secondary to COVID-19 infection. Upon admission, patient was also found to have a GI bleed and left basal ganglia stroke.  Discussion with patient's daughter [name] reveals that at baseline, patient is dependent for all ADLs but is able to answer yes/no questions and communicate in words and short phrases.  She is able to follow simple commands at baseline but cannot understand more complex requests or questions.  Due to her congential blindness, she will use her hands to explore and perceive her environment and will have tearing of the right eye when in pain.  Her daughter states that she is more responsive to family members than to others.  Stroke:  left basal ganglia and b/l CR infarct  likely secondary due to embolism cause by atrial fibrillation even on eliquis CT head Hypodensity within left basal ganglia, multiple chronic infarcts in bilateral basal ganglia, right thalamus, parietal lobes and right temporal lobe.  Small vessel disease. Atrophy.  CTA head & neck pending MRI  Pattern of atrophy and extensive ischemic changes, 2cm area of acute infarction in left basal ganglia and internal capsule. Smaller areas of acute infarction in caudate bodies, cerebellum and thalami, old bilateral parietal infarctions 2D Echo EF 60-65% LDL 155 HgbA1c 4.9 VTE prophylaxis - SCDs aspirin 81 mg daily and Eliquis (apixaban) daily prior to admission, now on Eliquis (apixaban) and ASA 81 daily. Consider to switch to Pradaxa if CTA shows no acute  stenosis Therapy recommendations:  SNF Disposition:  likely return to SNF  Hypertension Home meds:  Norvasc 10 mg daily, coreg 25 mg BID, norvasc 5 mg and coreg 25 mg BID continued in hospital Stable Permissive hypertension (OK if < 220/120) but gradually normalize in 5-7 days Long-term BP goal normotensive  Hyperlipidemia Home meds:  Atorvastatin 80 mg daily, resumed in hospital LDL 155, goal < 70 High intensity statin continued Continue statin at discharge  Atrial fibrillation A-fib seen on monitor Restart home Eliquis Switch to Pradaxa if no problems seen on CTA  Other Stroke Risk Factors Hx stroke/TIA - details not available Coronary artery disease CAD s/p CABG - continue home Coreg    Respiratory failure due to COVID-19 infection Supplemental o2 per primary team Decadron and remdesevir per primary team  GI bleed FOBT +, but no active bleeding Anemia on presentation Hb 6.6, s/p 2U PRBC, now 9.7->9.2 GI team wished to treat conservatively at this time Will restart anticoagulation with Eliquis  Congenital blindness Per daughter, patient uses hands to perceive environment Patient also has tearing of right eye when in pain Remove mitts when safe to do so  Other Active Problems   Hospital day # 1  Cortney E Ernestina Columbia , MSN, AGACNP-BC [facility] See Amion for schedule and pager information 04/29/2021 2:46 PM  ATTENDING NOTE: I reviewed above note and agree with the assessment and plan. Pt was seen and examined.   For detailed assessment and plan, please refer to above as I have made changes wherever appropriate.   I spent  35 minutes in total face-to-face time with the patient, more than 50% of which was spent in counseling and coordination of care, reviewing test results, images and medication, and discussing the diagnosis, treatment plan and potential prognosis. This patient's care requiresreview of multiple databases, neurological assessment,  discussion with family, other specialists and medical decision making of high complexity. I discussed with Dr. Miquel Dunn.   Marvel Plan, MD PhD Stroke Neurology 04/29/2021 7:50 PM      To contact Stroke Continuity provider, please refer to [email or web address]. After hours, contact General Neurology

## 2021-04-30 ENCOUNTER — Inpatient Hospital Stay (HOSPITAL_COMMUNITY): Payer: Medicaid Other

## 2021-04-30 DIAGNOSIS — E440 Moderate protein-calorie malnutrition: Secondary | ICD-10-CM

## 2021-04-30 DIAGNOSIS — K922 Gastrointestinal hemorrhage, unspecified: Secondary | ICD-10-CM

## 2021-04-30 DIAGNOSIS — Z7189 Other specified counseling: Secondary | ICD-10-CM

## 2021-04-30 DIAGNOSIS — Z1389 Encounter for screening for other disorder: Secondary | ICD-10-CM

## 2021-04-30 DIAGNOSIS — I4821 Permanent atrial fibrillation: Secondary | ICD-10-CM

## 2021-04-30 DIAGNOSIS — Z0189 Encounter for other specified special examinations: Secondary | ICD-10-CM

## 2021-04-30 DIAGNOSIS — D649 Anemia, unspecified: Secondary | ICD-10-CM

## 2021-04-30 LAB — CBC
HCT: 27 % — ABNORMAL LOW (ref 36.0–46.0)
Hemoglobin: 9.1 g/dL — ABNORMAL LOW (ref 12.0–15.0)
MCH: 28 pg (ref 26.0–34.0)
MCHC: 33.7 g/dL (ref 30.0–36.0)
MCV: 83.1 fL (ref 80.0–100.0)
Platelets: 164 10*3/uL (ref 150–400)
RBC: 3.25 MIL/uL — ABNORMAL LOW (ref 3.87–5.11)
RDW: 14.6 % (ref 11.5–15.5)
WBC: 8.2 10*3/uL (ref 4.0–10.5)
nRBC: 0.2 % (ref 0.0–0.2)

## 2021-04-30 LAB — URINE CULTURE: Culture: 40000 — AB

## 2021-04-30 LAB — HEMOGLOBIN AND HEMATOCRIT, BLOOD
HCT: 27.9 % — ABNORMAL LOW (ref 36.0–46.0)
Hemoglobin: 9.4 g/dL — ABNORMAL LOW (ref 12.0–15.0)

## 2021-04-30 LAB — GLUCOSE, CAPILLARY
Glucose-Capillary: 190 mg/dL — ABNORMAL HIGH (ref 70–99)
Glucose-Capillary: 158 mg/dL — ABNORMAL HIGH (ref 70–99)
Glucose-Capillary: 159 mg/dL — ABNORMAL HIGH (ref 70–99)
Glucose-Capillary: 112 mg/dL — ABNORMAL HIGH (ref 70–99)
Glucose-Capillary: 145 mg/dL — ABNORMAL HIGH (ref 70–99)
Glucose-Capillary: 141 mg/dL — ABNORMAL HIGH (ref 70–99)

## 2021-04-30 IMAGING — CT CT ANGIO HEAD-NECK (W OR W/O PERF)
1 of 11 series · 14 of 47 positions shown · IV contrast (omnipaque)
Comparison: Brain MRI [DATE]

CLINICAL DATA: Stroke follow-up

EXAM:
CT ANGIOGRAPHY HEAD AND NECK
TECHNIQUE: Multidetector CT imaging of the head and neck was performed using
the standard protocol during bolus administration of intravenous
contrast. Multiplanar CT image reconstructions and MIPs were
obtained to evaluate the vascular anatomy. Carotid stenosis
measurements (when applicable) are obtained utilizing NASCET
criteria, using the distal internal carotid diameter as the
denominator.
CONTRAST:  75mL OMNIPAQUE IOHEXOL 350 MG/ML SOLN

[Series 16: thin · axial · 0.40mm/px · z∈[-369,-99]mm · 14 of 624 slices shown]
[im 42/624  brain]
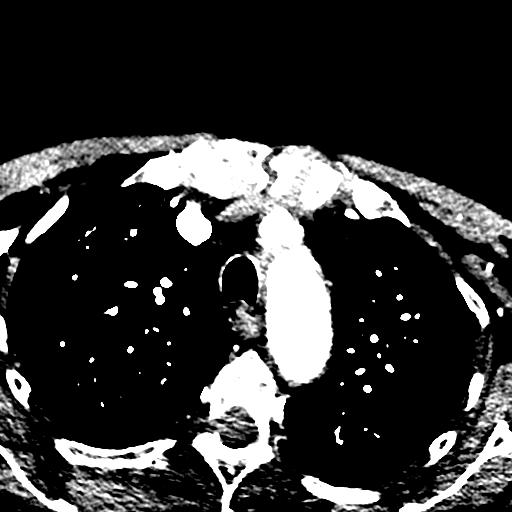
[im 84/624  bone]
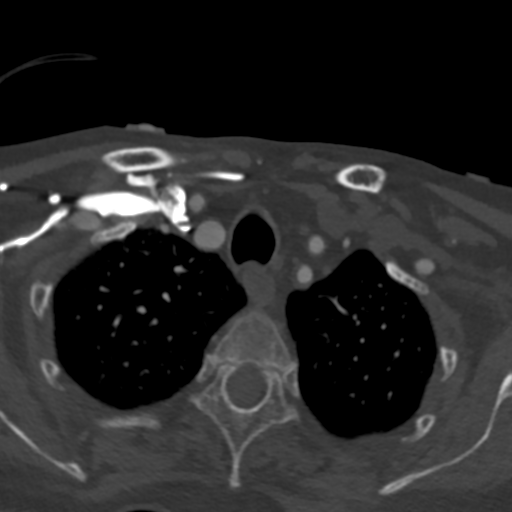
[im 125/624  brain]
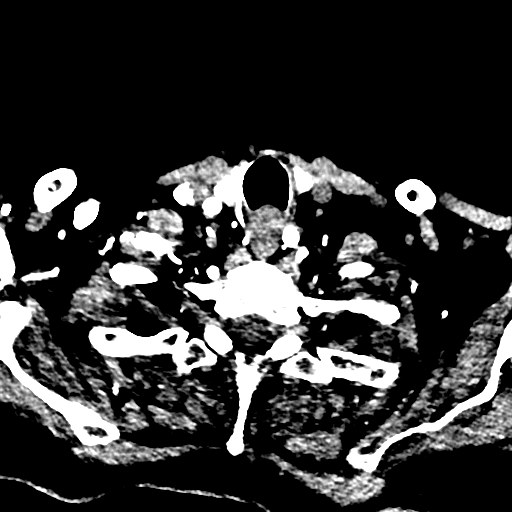
[im 167/624  bone]
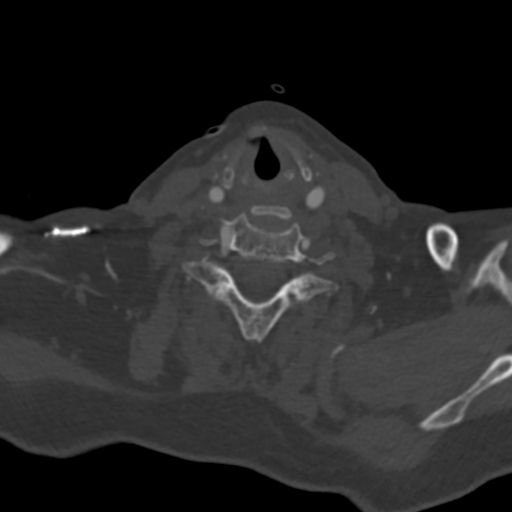
[im 208/624  brain]
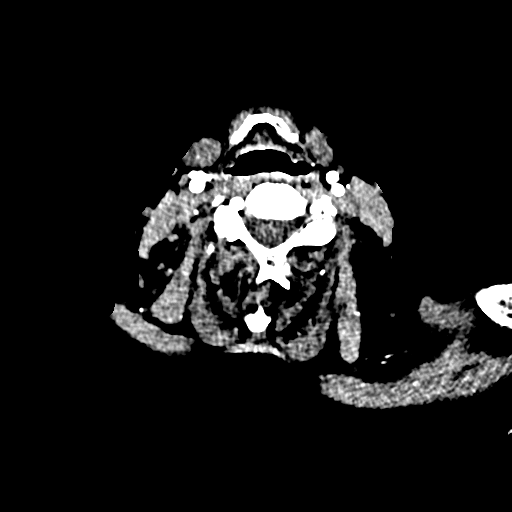
[im 250/624  bone]
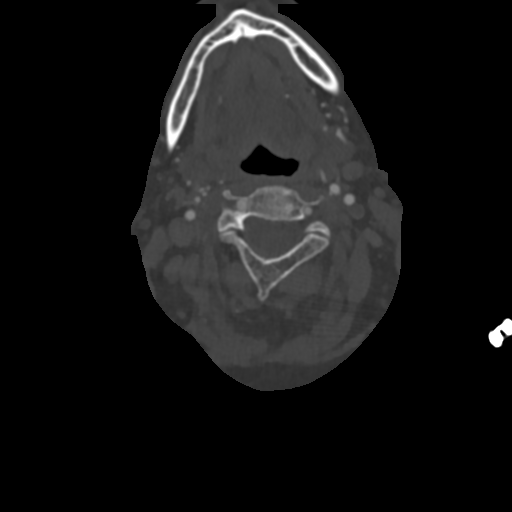
[im 291/624  brain]
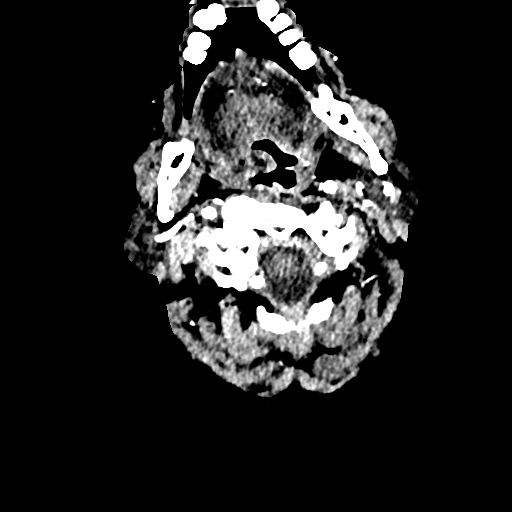
[im 333/624  bone]
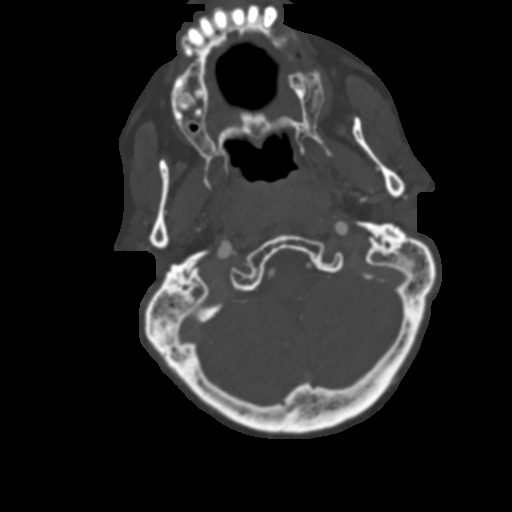
[im 374/624  brain]
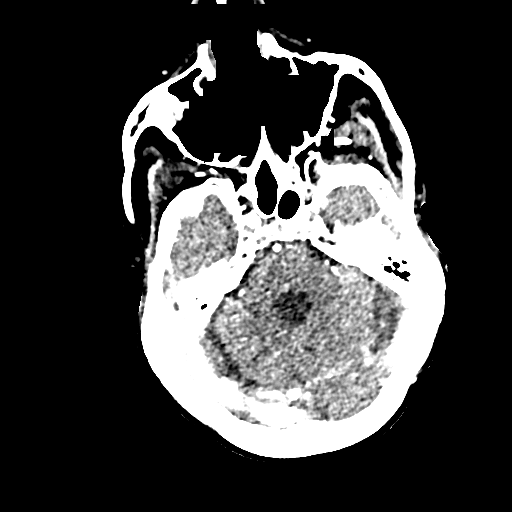
[im 416/624  bone]
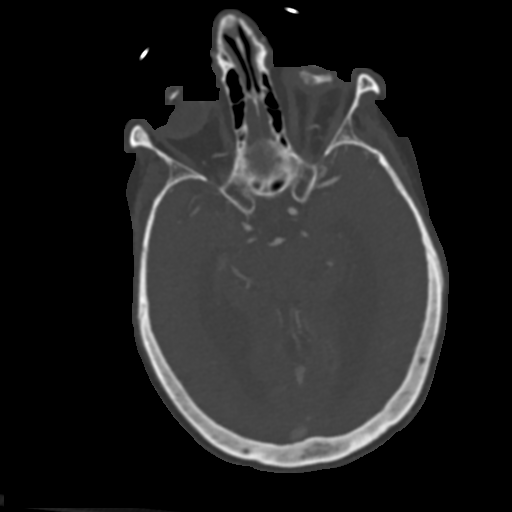
[im 457/624  brain]
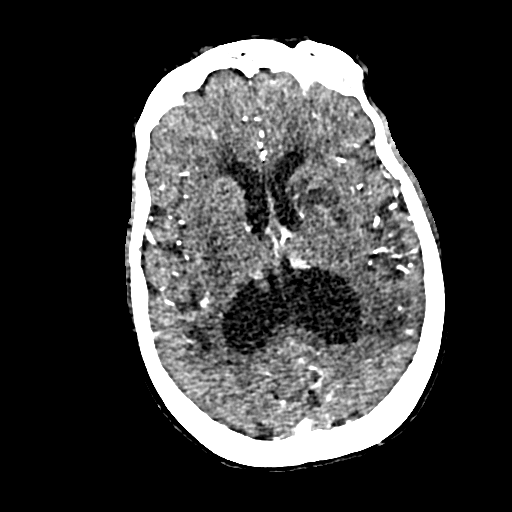
[im 499/624  bone]
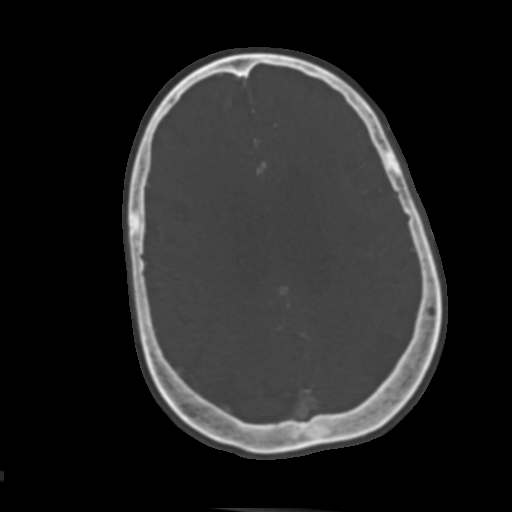
[im 540/624  brain]
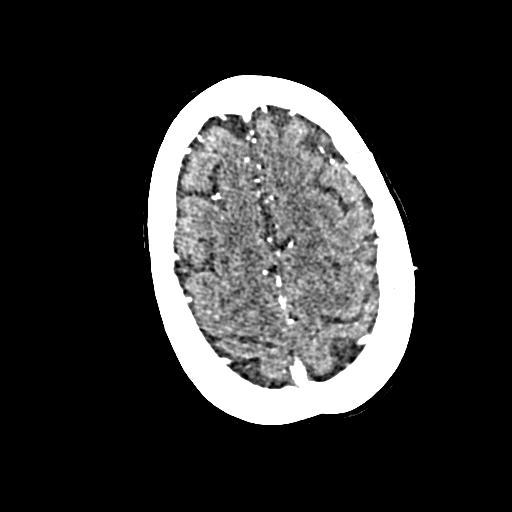
[im 582/624  bone]
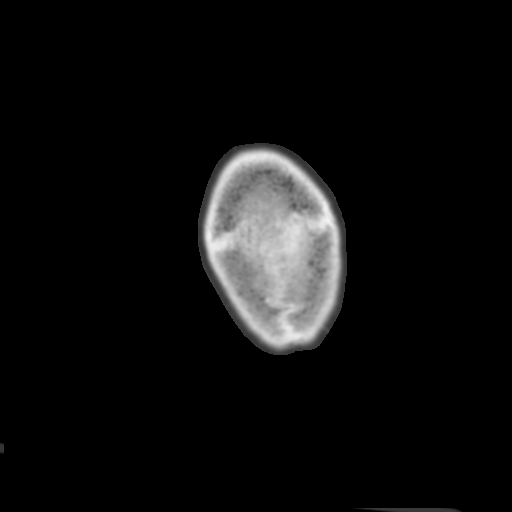

[14 of 47 positions shown; findings below may reference images not displayed]

FINDINGS: CT HEAD FINDINGS

Brain: There is no mass, hemorrhage or extra-axial collection. There
is generalized atrophy without lobar predilection. There are
multiple old small vessel infarcts and unchanged old bilateral
parietal cortical infarcts. There is hypoattenuation of the
periventricular white matter, most commonly indicating chronic
ischemic microangiopathy.

Skull: The visualized skull base, calvarium and extracranial soft
tissues are normal.

Sinuses/Orbits: No fluid levels or advanced mucosal thickening of
the visualized paranasal sinuses. No mastoid or middle ear effusion.
Bilateral phthisis bulbi.

CTA NECK FINDINGS

SKELETON: There is no bony spinal canal stenosis. No lytic or
blastic lesion.

OTHER NECK: Markedly enlarged adenoid tonsils, unusual for age.

UPPER CHEST: No pneumothorax or pleural effusion. No nodules or
masses.

AORTIC ARCH:

There is calcific atherosclerosis of the aortic arch. There is no
aneurysm, dissection or hemodynamically significant stenosis of the
visualized portion of the aorta. Conventional 3 vessel aortic
branching pattern. The visualized proximal subclavian arteries are
widely patent.

RIGHT CAROTID SYSTEM: Wavy, undulating pattern of the ICA proximal
to the skull base. There is low density atherosclerosis extending
into the proximal ICA, resulting in 50% stenosis.

LEFT CAROTID SYSTEM: Undulating appearance of the ICA in the upper
neck. There is low density atherosclerosis extending into the
proximal ICA, resulting in 50% stenosis.

VERTEBRAL ARTERIES: Right dominant configuration. Both origins are
clearly patent. There is no dissection, occlusion or flow-limiting
stenosis to the skull base (V1-V3 segments).

CTA HEAD FINDINGS

POSTERIOR CIRCULATION:

--Vertebral arteries: Normal V4 segments.

--Inferior cerebellar arteries: Normal.

--Basilar artery: Normal.

--Superior cerebellar arteries: Normal.

--Posterior cerebral arteries (PCA): Normal.

ANTERIOR CIRCULATION:

--Intracranial internal carotid arteries: There is a 7 mm left
clinoid segment aneurysm (series 10, image 208). There is fusiform
dilatation of the clinoid segment of the right ICA, measuring 5 mm.

--Anterior cerebral arteries (ACA): Normal. Both A1 segments are
present. Patent anterior communicating artery (a-comm).

--Middle cerebral arteries (MCA): Normal.

VENOUS SINUSES: As permitted by contrast timing, patent.

ANATOMIC VARIANTS: None

Review of the MIP images confirms the above findings.
IMPRESSION: 1. No intracranial arterial occlusion or high-grade stenosis.
2. 7 mm left clinoid segment ICA aneurysm. 5 mm fusiform dilatation
of the left ICA clinoid segment.
3. Wavy, undulating appearance of the internal carotid arteries in
the upper neck, consistent with fibromuscular dysplasia.
4. Bilateral proximal ICA stenosis measuring 50% by NASCET criteria.
5. Markedly enlarged adenoid tonsils, unusual for age. This may
indicate tonsillopharyngitis, though lymphoproliferative disorder is
also of concern.
6. No acute intracranial abnormality. Multiple old small vessel
infarcts and old bilateral parietal cortical infarcts.

Aortic Atherosclerosis ([RL]-[RL]).

## 2021-04-30 MED ORDER — SODIUM CHLORIDE 0.9 % IV SOLN
250.0000 mg | Freq: Every day | INTRAVENOUS | Status: AC
  Administered 2021-04-30 – 2021-05-01 (×2): 250 mg via INTRAVENOUS
  Filled 2021-04-30 (×2): qty 20

## 2021-04-30 MED ORDER — APIXABAN 5 MG PO TABS
5.0000 mg | ORAL_TABLET | Freq: Two times a day (BID) | ORAL | Status: DC
  Administered 2021-04-30 – 2021-05-02 (×4): 5 mg
  Filled 2021-04-30 (×4): qty 1

## 2021-04-30 MED ORDER — DEXAMETHASONE 6 MG PO TABS
6.0000 mg | ORAL_TABLET | Freq: Every day | ORAL | Status: DC
  Administered 2021-04-30 – 2021-05-02 (×3): 6 mg
  Filled 2021-04-30 (×3): qty 1

## 2021-04-30 MED ORDER — SODIUM CHLORIDE 0.9 % IV SOLN: 500.0000 mg | Freq: Once | INTRAVENOUS | Status: DC

## 2021-04-30 MED ORDER — PANTOPRAZOLE 2 MG/ML SUSPENSION
40.0000 mg | Freq: Every day | ORAL | Status: DC
  Administered 2021-05-01 – 2021-05-02 (×2): 40 mg
  Filled 2021-04-30 (×2): qty 20

## 2021-04-30 MED ORDER — IOHEXOL 350 MG/ML SOLN
75.0000 mL | Freq: Once | INTRAVENOUS | Status: AC | PRN
  Administered 2021-04-30: 75 mL via INTRAVENOUS

## 2021-04-30 MED ORDER — DABIGATRAN ETEXILATE MESYLATE 150 MG PO CAPS: 150.0000 mg | ORAL_CAPSULE | Freq: Two times a day (BID) | ORAL | Status: DC

## 2021-04-30 NOTE — Progress Notes (Addendum)
STROKE TEAM PROGRESS NOTE   INTERVAL HISTORY Patient was seen in her room with no family at the bedside.  She was admitted on 11/29 from the SNF where she resides with altered mental status, COVID-19 infection and GI bleed.  She was found to have a left basal ganglia stroke (of note, patient was recently hospitalized at [facility] with 5 strokes per her daughter) and is now undergoing stroke workup.  Discussed switching eliquis to pradaxa, however we are unable to while her pills are being crushed and given per tube.   Vitals:   04/30/21 0000 04/30/21 0452 04/30/21 0741 04/30/21 1227  BP: (!) 165/61 (!) 113/53 (!) 166/82 (!) 171/73  Pulse: 65 (!) 50 65 61  Resp: 20 15 20 18   Temp: 98.2 F (36.8 C) 98.3 F (36.8 C) 98 F (36.7 C) 98.3 F (36.8 C)  TempSrc: Axillary Axillary Axillary Oral  SpO2: 100% 100% 97% 100%  Weight:  50.2 kg    Height:       CBC:  Recent Labs  Lab 04/27/21 1143 04/27/21 1654 04/29/21 0244 04/29/21 1827 04/30/21 0220  WBC 10.6*   < > 8.9  --  8.2  NEUTROABS 8.2*  --   --   --   --   HGB 7.6*   < > 9.4* 9.2* 9.1*  HCT 24.8*   < > 28.3* 27.5* 27.0*  MCV 91.5   < > 83.7  --  83.1  PLT 230   < > 194  --  164   < > = values in this interval not displayed.    Basic Metabolic Panel:  Recent Labs  Lab 04/28/21 0337 04/29/21 0244  NA 139 135  K 4.2 3.7  CL 106 100  CO2 23 24  GLUCOSE 132* 130*  BUN 23 29*  CREATININE 0.75 0.83  CALCIUM 9.4 9.0    Lipid Panel:  Recent Labs  Lab 04/29/21 0244  CHOL 211*  TRIG 72  HDL 42  CHOLHDL 5.0  VLDL 14  LDLCALC 14/01/22*    HgbA1c:  Recent Labs  Lab 04/29/21 0244  HGBA1C 4.9    Urine Drug Screen: No results for input(s): LABOPIA, COCAINSCRNUR, LABBENZ, AMPHETMU, THCU, LABBARB in the last 168 hours.  Alcohol Level No results for input(s): ETH in the last 168 hours.  IMAGING past 24 hours CT ANGIO HEAD NECK W WO CM  Result Date: 04/30/2021 CLINICAL DATA:  Stroke follow-up EXAM: CT  ANGIOGRAPHY HEAD AND NECK TECHNIQUE: Multidetector CT imaging of the head and neck was performed using the standard protocol during bolus administration of intravenous contrast. Multiplanar CT image reconstructions and MIPs were obtained to evaluate the vascular anatomy. Carotid stenosis measurements (when applicable) are obtained utilizing NASCET criteria, using the distal internal carotid diameter as the denominator. CONTRAST:  47mL OMNIPAQUE IOHEXOL 350 MG/ML SOLN COMPARISON:  Brain MRI 04/28/2021 FINDINGS: CT HEAD FINDINGS Brain: There is no mass, hemorrhage or extra-axial collection. There is generalized atrophy without lobar predilection. There are multiple old small vessel infarcts and unchanged old bilateral parietal cortical infarcts. There is hypoattenuation of the periventricular white matter, most commonly indicating chronic ischemic microangiopathy. Skull: The visualized skull base, calvarium and extracranial soft tissues are normal. Sinuses/Orbits: No fluid levels or advanced mucosal thickening of the visualized paranasal sinuses. No mastoid or middle ear effusion. Bilateral phthisis bulbi. CTA NECK FINDINGS SKELETON: There is no bony spinal canal stenosis. No lytic or blastic lesion. OTHER NECK: Markedly enlarged adenoid tonsils, unusual for  age. UPPER CHEST: No pneumothorax or pleural effusion. No nodules or masses. AORTIC ARCH: There is calcific atherosclerosis of the aortic arch. There is no aneurysm, dissection or hemodynamically significant stenosis of the visualized portion of the aorta. Conventional 3 vessel aortic branching pattern. The visualized proximal subclavian arteries are widely patent. RIGHT CAROTID SYSTEM: Wavy, undulating pattern of the ICA proximal to the skull base. There is low density atherosclerosis extending into the proximal ICA, resulting in 50% stenosis. LEFT CAROTID SYSTEM: Undulating appearance of the ICA in the upper neck. There is low density atherosclerosis extending  into the proximal ICA, resulting in 50% stenosis. VERTEBRAL ARTERIES: Right dominant configuration. Both origins are clearly patent. There is no dissection, occlusion or flow-limiting stenosis to the skull base (V1-V3 segments). CTA HEAD FINDINGS POSTERIOR CIRCULATION: --Vertebral arteries: Normal V4 segments. --Inferior cerebellar arteries: Normal. --Basilar artery: Normal. --Superior cerebellar arteries: Normal. --Posterior cerebral arteries (PCA): Normal. ANTERIOR CIRCULATION: --Intracranial internal carotid arteries: There is a 7 mm left clinoid segment aneurysm (series 10, image 208). There is fusiform dilatation of the clinoid segment of the right ICA, measuring 5 mm. --Anterior cerebral arteries (ACA): Normal. Both A1 segments are present. Patent anterior communicating artery (a-comm). --Middle cerebral arteries (MCA): Normal. VENOUS SINUSES: As permitted by contrast timing, patent. ANATOMIC VARIANTS: None Review of the MIP images confirms the above findings. IMPRESSION: 1. No intracranial arterial occlusion or high-grade stenosis. 2. 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 5 mm fusiform dilatation of the left ICA clinoid segment. 3. Wavy, undulating appearance of the internal carotid arteries in the upper neck, consistent with fibromuscular dysplasia. 4. 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 5. Markedly enlarged adenoid tonsils, unusual for age. This may indicate tonsillopharyngitis, though lymphoproliferative disorder is also of concern. 6. No acute intracranial abnormality. Multiple old small vessel infarcts and old bilateral parietal cortical infarcts. Aortic Atherosclerosis (ICD10-I70.0). Electronically Signed   By: Deatra Robinson M.D.   On: 04/30/2021 03:24    PHYSICAL EXAM General: Thin, chronically ill appearing female in no acute distress  Cardiac: atrial fibrillation seen on monitor  Neurological:  Patient does not respond to voice or follow commands.  Unable to  check pupils or corneal reflexes due to congenital blindness.  Patient is able to move all extremities nonpurposefully, L>R.  She will withdraw to noxious stimulus but cannot localize.  ASSESSMENT/PLAN Ms. Kye Huntsberger is a 60 y.o. female with history of multiple strokes, congenital blindness, atrial fibrillation, CAD s/p CABG, and dysphagia with PEG tube presenting with altered mental status and respiratory distress secondary to COVID-19 infection. Upon admission, patient was also found to have a GI bleed and left basal ganglia stroke.  Discussion with patient's daughter Corrie Dandy reveals that at baseline, patient is dependent for all ADLs but is able to answer yes/no questions and communicate in words and short phrases.  She is able to follow simple commands at baseline but cannot understand more complex requests or questions.  Due to her congential blindness, she will use her hands to explore and perceive her environment and will have tearing of the right eye when in pain.  Her daughter states that she is more responsive to family members than to others.   Stroke:  left BG and b/l CR infarct  likely secondary due to embolism cause by atrial fibrillation even on eliquis CT head Hypodensity within left basal ganglia, multiple chronic infarcts in bilateral basal ganglia, right thalamus, parietal lobes and right temporal lobe.  Small vessel disease. Atrophy.  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 5 mm fusiform dilatation of the left ICA clinoid segment. Bilateral proximal ICA stenosis measuring 50% by NASCET criteria MRI  Pattern of atrophy and extensive ischemic changes, 2cm area of acute infarction in left basal ganglia and internal capsule. Smaller areas of acute infarction in caudate bodies, cerebellum and thalami, old bilateral parietal infarctions 2D Echo EF 60-65% LDL 155 HgbA1c 4.9 VTE prophylaxis - SCDs aspirin 81 mg daily and Eliquis (apixaban) daily prior to admission, now on  Eliquis (apixaban) and ASA 81 daily, continue on discharge. Not candidate for Xarelto due to GIB and for pradaxa due to tube route of meds Therapy recommendations:  SNF Disposition:  likely return to SNF, agree with palliative care involvement for [place] discussion  Hypertension Home meds:  Norvasc 10 mg daily, coreg 25 mg BID, norvasc 5 mg and coreg 25 mg BID continued in hospital Stable Long-term BP goal normotensive  Hyperlipidemia Home meds:  Atorvastatin 80 mg daily, resumed in hospital LDL 155, goal < 70 High intensity statin continued Continue statin at discharge  Atrial fibrillation A-fib seen on monitor Restart home Eliquis Unable to switch to pradaxa because it can not be crushed. Not good candidate for Xarelto due to GI bleeding.  Keep patient on Eliquis for right now  Other Stroke Risk Factors Hx stroke/TIA - details not available Coronary artery disease CAD s/p CABG - continue home Coreg    Respiratory failure due to COVID-19 infection Supplemental o2 per primary team Decadron and remdesevir per primary team  GI bleed FOBT +, but no active bleeding Anemia on presentation Hb 6.6, s/p 2U PRBC, now 9.7->9.2->9.1 GI team wished to treat conservatively at this time Will continue anticoagulation with Eliquis  Congenital blindness Per daughter, patient uses hands to perceive environment Patient also has tearing of right eye when in pain Remove mitts when safe to do so   Hospital day # 2  Dessa Phi , [place], FNP-BC [facility] [phone]  ATTENDING NOTE: I reviewed above note and agree with the assessment and plan. Pt was seen and examined.   For detailed assessment and plan, please refer to above as I have made changes wherever appropriate.   Neurology will sign off. Please call with questions. Pt will follow up with [facility] neurology. Thanks for the consult.   Rosalin Hawking, MD PhD Stroke Neurology 04/30/2021 6:50 PM      To contact Stroke  Continuity provider, please refer to [email or web address] After hours, contact General Neurology

## 2021-04-30 NOTE — Progress Notes (Signed)
FPTS Brief Progress Note  S: Went to see patient for evening rounds. She is currently in radiology suite for CTA head/neck. Spoke with primary RN who did not voice any concerns- reports patient is unchanged from prior.    O: BP (!) 165/61 (BP Location: Right Arm)   Pulse 65   Temp 98.2 F (36.8 C) (Axillary)   Resp 20   Ht 5\' 3"  (1.6 m)   Wt 49.2 kg   SpO2 100%   BMI 19.21 kg/m     A/P: Acute Anemia  FOBT positive stool Hgb stable at 9.2 after re-starting Eliquis yesterday. No additional bleeding noted. -Trend CBC -Continue to monitor  Acute Left Basal Ganglia Stroke -f/u CTA head/neck results -appreciate neuro recommendations, considering switch to Pradaxa -gradually normalizing BP, no changes in meds at this time -palliative consulted  COVID-19 Infection Remains stable on 2L [place] -Continue Remdesevir and Decadron -Wean O2 as tolerated  Goals of Care -Palliative consulted   - Orders reviewed. Labs for AM ordered, which was adjusted as needed.    , MD 04/30/2021, 3:16 AM PGY-2, [place] [facility] Night Resident  Please page [phone] with questions.

## 2021-04-30 NOTE — Consult Note (Signed)
Consultation Note Date: 04/30/2021   Patient Name: Jill Buchanan  DOB: [birth year]  MRN: [id number]  Age / Sex: 60 y.o., female  PCP: Pcp, No Referring Physician: Billey Co, MD  Reason for Consultation: recurrent strokes, goals of care discussion  HPI/Patient Profile: 60 y.o. female  with past medical history of multiple strokes, congenital blindness, a fib (on aspirin and Eliquis), CAD s/p CABG, dysphagia s/p PEG admitted on 04/27/2021 with altered mental status and respiratory distress- workup so far reveals COVID+ and acute CVA. Palliative consulted for goals of care discussion.   Primary Decision Maker NEXT OF KIN- patient has six children- Corrie Dandy is primary contact and decision maker, there is no legal HCPOA in place  Discussion: Per nursing patient is minimally verbal- appears she may have said "ok" yesterday while nurse was assessing her, no verbal responses today for medical staff. She has also seemed a little more "fidgety" today.  Spoke with daughter, Corrie Dandy.  [name] visited patient today and reports that her Mom answered her questions verbally, sought out locations of objects with purposeful movements, and was actually moving her lower extremities more than she had been able to when she was hospitalized last month at [facility].  Attempted to elicit patient's values, goals of care, and what patient's baseline for acceptable quality of life would be.  Corrie Dandy shares that her expectations for her mother's recovery are not high. She understands that Jill Buchanan is going to be permanently debilitated.  Her hope would be that her Mom would be able to sit up in a wheelchair and be able to enjoy listening to her favorite tv shows (Dateline, true crime, etc.).  She notes that her mom is not trusting of medical providers and believes this is why she responds better to her daughter vs care providers. Corrie Dandy states she  would be willing to be present for a "true test" of her mom's capabilities. We discussed that in reality each assessment is a "true test" whether Corrie Dandy is present or not, it shows her current state be it her ability or her willingness- especially since [place] cannot be present 24/7.  For now, Corrie Dandy would like to continue current interventions. Agrees to followup from Palliative on Tuesday.     SUMMARY OF RECOMMENDATIONS -Continue current scope of care, not interested in comfort care and Hospice at this time -Palliative will revisit patient and discuss with Corrie Dandy on Tuesday- Corrie Dandy is hopeful that since she visited her mother today and told her to work with medical providers that she will have some improvement  -If patient is discharged before Tuesday recommend referral for outpatient Palliative to see at SNF for continued goals of care discussions  Code Status/Advance Care Planning: DNR   Prognosis:   Unable to determine  Discharge Planning: To Be Determined  Primary Diagnoses: Present on Admission:  Fever  Stroke (HCC)   Vital Signs: BP (!) 134/54 (BP Location: Right Arm)   Pulse 61   Temp 98 F (36.7 C) (Axillary)   Resp 16   Ht 5'  3" (1.6 m)   Wt 50.2 kg   SpO2 100%   BMI 19.60 kg/m  Pain Scale: Faces   Pain Score: 0-No pain   SpO2: SpO2: 100 % O2 Device:SpO2: 100 % O2 Flow Rate: .O2 Flow Rate (L/min): 2 L/min  IO: Intake/output summary:  Intake/Output Summary (Last 24 hours) at 04/30/2021 1634 Last data filed at 04/30/2021 1307 Gross per 24 hour  Intake 2817.17 ml  Output 2000 ml  Net 817.17 ml    LBM: Last BM Date: 04/28/21 Baseline Weight: Weight: 49.9 kg Most recent weight: Weight: 50.2 kg      Thank you for this consult. Palliative medicine will continue to follow and assist as needed.   Time In: 1545 Time Out: 1655 Time Total: 70 mins Greater than 50%  of this time was spent counseling and coordinating care related to the above assessment and  plan.  Signed by: Mariana Kaufman, AGNP-C Palliative Medicine    Please contact Palliative Medicine Team phone at [phone] for questions and concerns.  For individual provider: See Shea [place]

## 2021-04-30 NOTE — Progress Notes (Addendum)
Family Medicine Teaching Service Daily Progress Note Intern Pager: [phone]  Patient name: Tieasha Carchi Medical record number: [id number] Date of birth: Oct 16, 1960 Age: 60 y.o. Gender: female  Primary Care Provider: Pcp, No Consultants: GI, Neuro, Nutrition, Palliative Care Code Status: DNR  Pt Overview and Major Events to Date:  11/29- admitted 12/1- Code Status changed to DNR  Assessment and Plan: Lucill Abrahamsen is a 60yo female presenting from SNF with AMS and fever, found to be COVID positive and anemic w/ FOBT positive, subesquently found to have acute CVA. PMH significant for A Fib on DOAC, CAD s/p CABG, CVA (October 2022), dysphagia w/ PEG in place.   Acute CVA Hx of recurrent stroke HLD Seen by neuro yesterday who obtained a CTA head which revealed no intracranial arterial occlusion or high-grade stenosis. Did show 60mm L clinoid segment ICA aneurysm and 65mm fusiform dilatation of the L ICA clinoid segment. Also wavy, undulating appearance of the internal carotid arteries in upper neck, consistent with fibromuscular dysplasia. Considered that degree of stenosis did not explain strokes and that this was a possible Eliquis failure, however, she is not a candidate for Pradaxa as it cannot be given per tube.  LDL above goal at 155. (Goal <70). BPs have been within acceptable range for hospitalized patient (110s-160s/50s-60s).  Mental status today unchanged from previous.  Withdrawals to painful stimuli and attempts to open eyes but does not engage with interview or follow commands. Overall prognosis remains quite poor. Palliative care consult placed yesterday with daughter's consent and patient made DNR. - Goals of care conversation with palliative care - Continue Eliquis and Aspirin - Continue amlodipine 5mg  daily and Coreg 25mg  BID - Atorvastatin 80mg  daily  - May consider other lipid-lowering meds pending GOC discussion   Acute Blood Loss Anemia Likely upper GI bleed Hgb  9.4>9.2>9.1 after restarting Eliquis and Aspirin. Site of bleed most likely newly placed PEG. GI signed off.  - If develops overt bleed, reconsult GI for scope - IV iron x1 - Continue serial CBC until Hgb stabilizes or rises  Acute Hypoxic Respiratory Failure 2/2 COVID-19 infection Remains Afebrile. On 2L [place] for past 48 hours.  - Continue remdesivir 4/5 - Continue decadron 4/10  Dysphagia s/p CVA, PEG in place Continue tube feeds, meds per tube. Euvolemic. - Continue feeds and free water per tube  A Fib on DOAC - Rate-controlled - Continue Eliquis and Coreg 25mg   Constipation 2x stool two days ago, none yesterday. - Continue Miralax BID and Senna daily   FEN/GI: Tube feeds per nutrition recs PPx: SCDs Dispo:To be detemined, hospice vs SNF pending palliative discussion  Subjective:  Ms. Alycia Patten remains minimally responsive. Discussed care with her daughter yesterday who would like to move forward with palliative consult and possible hospice care.  Objective: Temp:  [97.6 F (36.4 C)-98.4 F (36.9 C)] 98.3 F (36.8 C) (12/02 0452) Pulse Rate:  [50-71] 50 (12/02 0452) Resp:  [15-20] 15 (12/02 0452) BP: (113-165)/(53-64) 113/53 (12/02 0452) SpO2:  [94 %-100 %] 100 % (12/02 0452) Weight:  [50.2 kg] 50.2 kg (12/02 0452) Physical Exam: General: Chronically ill-appearing, NAD.  Minimally responsive. Cardiovascular: Irregular rhythm, regular rate, no murmur Respiratory: Normal work of breathing on 2 L, lungs clear to auscultation Abdomen: Nondistended, PEG in place without erythema or drainage Extremities: Contracted upper extremities Neuro: Attempts to open eyes to voice, withdraws to painful stimuli, does not follow commands  Laboratory: Recent Labs  Lab 04/28/21 0337 04/28/21 1259 04/29/21 0244 04/29/21 1827 04/30/21 0220  WBC 9.1  --  8.9  --  8.2  HGB 9.7*   < > 9.4* 9.2* 9.1*  HCT 30.4*   < > 28.3* 27.5* 27.0*  PLT 191  --  194  --  164   < > = values in this  interval not displayed.   Recent Labs  Lab 04/27/21 1143 04/28/21 0337 04/29/21 0244  NA 146* 139 135  K 4.3 4.2 3.7  CL 111 106 100  CO2 25 23 24   BUN 32* 23 29*  CREATININE 0.96 0.75 0.83  CALCIUM 9.6 9.4 9.0  PROT 6.9 6.8  --   BILITOT 0.7 1.1  --   ALKPHOS 70 68  --   ALT 22 19  --   AST 29 30  --   GLUCOSE 131* 132* 130*     Imaging/Diagnostic Tests: EXAM: CT ANGIOGRAPHY HEAD AND NECK   TECHNIQUE: Multidetector CT imaging of the head and neck was performed using the standard protocol during bolus administration of intravenous contrast. Multiplanar CT image reconstructions and MIPs were obtained to evaluate the vascular anatomy. Carotid stenosis measurements (when applicable) are obtained utilizing NASCET criteria, using the distal internal carotid diameter as the denominator.   CONTRAST:  68mL OMNIPAQUE IOHEXOL 350 MG/ML SOLN   COMPARISON:  Brain MRI 04/28/2021   FINDINGS: CT HEAD FINDINGS   Brain: There is no mass, hemorrhage or extra-axial collection. There is generalized atrophy without lobar predilection. There are multiple old small vessel infarcts and unchanged old bilateral parietal cortical infarcts. There is hypoattenuation of the periventricular white matter, most commonly indicating chronic ischemic microangiopathy.   Skull: The visualized skull base, calvarium and extracranial soft tissues are normal.   Sinuses/Orbits: No fluid levels or advanced mucosal thickening of the visualized paranasal sinuses. No mastoid or middle ear effusion. Bilateral phthisis bulbi.   CTA NECK FINDINGS   SKELETON: There is no bony spinal canal stenosis. No lytic or blastic lesion.   OTHER NECK: Markedly enlarged adenoid tonsils, unusual for age.   UPPER CHEST: No pneumothorax or pleural effusion. No nodules or masses.   AORTIC ARCH:   There is calcific atherosclerosis of the aortic arch. There is no aneurysm, dissection or hemodynamically significant  stenosis of the visualized portion of the aorta. Conventional 3 vessel aortic branching pattern. The visualized proximal subclavian arteries are widely patent.   RIGHT CAROTID SYSTEM: Wavy, undulating pattern of the ICA proximal to the skull base. There is low density atherosclerosis extending into the proximal ICA, resulting in 50% stenosis.   LEFT CAROTID SYSTEM: Undulating appearance of the ICA in the upper neck. There is low density atherosclerosis extending into the proximal ICA, resulting in 50% stenosis.   VERTEBRAL ARTERIES: Right dominant configuration. Both origins are clearly patent. There is no dissection, occlusion or flow-limiting stenosis to the skull base (V1-V3 segments).   CTA HEAD FINDINGS   POSTERIOR CIRCULATION:   --Vertebral arteries: Normal V4 segments.   --Inferior cerebellar arteries: Normal.   --Basilar artery: Normal.   --Superior cerebellar arteries: Normal.   --Posterior cerebral arteries (PCA): Normal.   ANTERIOR CIRCULATION:   --Intracranial internal carotid arteries: There is a 7 mm left clinoid segment aneurysm (series 10, image 208). There is fusiform dilatation of the clinoid segment of the right ICA, measuring 5 mm.   --Anterior cerebral arteries (ACA): Normal. Both A1 segments are present. Patent anterior communicating artery (a-comm).   --Middle cerebral arteries (MCA): Normal.   VENOUS SINUSES: As permitted by contrast timing, patent.  ANATOMIC VARIANTS: None   Review of the MIP images confirms the above findings.   IMPRESSION: 1. No intracranial arterial occlusion or high-grade stenosis. 2. 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 5 mm fusiform dilatation of the left ICA clinoid segment. 3. Wavy, undulating appearance of the internal carotid arteries in the upper neck, consistent with fibromuscular dysplasia. 4. 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 5. Markedly enlarged adenoid tonsils, unusual  for age. This may indicate tonsillopharyngitis, though lymphoproliferative disorder is also of concern. 6. No acute intracranial abnormality. Multiple old small vessel infarcts and old bilateral parietal cortical infarcts.   Aortic Atherosclerosis (ICD10-I70.0).  Alicia Amel, MD 04/30/2021, 6:25 AM PGY-1, [facility] Family [facility] FPTS Intern pager: [phone], text pages welcome

## 2021-05-01 LAB — TYPE AND SCREEN
ABO/RH(D): O POS
Antibody Screen: NEGATIVE
Unit division: 0
Unit division: 0

## 2021-05-01 LAB — BASIC METABOLIC PANEL
Anion gap: 11 (ref 5–15)
BUN: 28 mg/dL — ABNORMAL HIGH (ref 8–23)
CO2: 23 mmol/L (ref 22–32)
Calcium: 8.3 mg/dL — ABNORMAL LOW (ref 8.9–10.3)
Chloride: 100 mmol/L (ref 98–111)
Creatinine, Ser: 0.71 mg/dL (ref 0.44–1.00)
GFR, Estimated: >60 mL/min (ref >60)
Glucose, Bld: 147 mg/dL — ABNORMAL HIGH (ref 70–99)
Potassium: 3.7 mmol/L (ref 3.5–5.1)
Sodium: 134 mmol/L — ABNORMAL LOW (ref 135–145)

## 2021-05-01 LAB — GLUCOSE, CAPILLARY
Glucose-Capillary: 117 mg/dL — ABNORMAL HIGH (ref 70–99)
Glucose-Capillary: 143 mg/dL — ABNORMAL HIGH (ref 70–99)
Glucose-Capillary: 105 mg/dL — ABNORMAL HIGH (ref 70–99)
Glucose-Capillary: 138 mg/dL — ABNORMAL HIGH (ref 70–99)
Glucose-Capillary: 149 mg/dL — ABNORMAL HIGH (ref 70–99)
Glucose-Capillary: 141 mg/dL — ABNORMAL HIGH (ref 70–99)

## 2021-05-01 LAB — BPAM RBC
Blood Product Expiration Date: 202212272359
Blood Product Expiration Date: 202212272359
ISSUE DATE / TIME: 202211291518
ISSUE DATE / TIME: 202211292147
Unit Type and Rh: 5100
Unit Type and Rh: 5100

## 2021-05-01 LAB — CBC
HCT: 25.6 % — ABNORMAL LOW (ref 36.0–46.0)
Hemoglobin: 8.3 g/dL — ABNORMAL LOW (ref 12.0–15.0)
MCH: 27.1 pg (ref 26.0–34.0)
MCHC: 32.4 g/dL (ref 30.0–36.0)
MCV: 83.7 fL (ref 80.0–100.0)
Platelets: 192 10*3/uL (ref 150–400)
RBC: 3.06 MIL/uL — ABNORMAL LOW (ref 3.87–5.11)
RDW: 14.3 % (ref 11.5–15.5)
WBC: 9.5 10*3/uL (ref 4.0–10.5)
nRBC: 0.2 % (ref 0.0–0.2)

## 2021-05-01 LAB — HEMOGLOBIN AND HEMATOCRIT, BLOOD
HCT: 28.5 % — ABNORMAL LOW (ref 36.0–46.0)
Hemoglobin: 9.4 g/dL — ABNORMAL LOW (ref 12.0–15.0)

## 2021-05-01 MED ORDER — FREE WATER
120.0000 mL | Freq: Four times a day (QID) | Status: DC
  Administered 2021-05-01 – 2021-05-02 (×4): 120 mL

## 2021-05-01 MED ORDER — AMLODIPINE BESYLATE 10 MG PO TABS
10.0000 mg | ORAL_TABLET | Freq: Every day | ORAL | Status: DC
  Administered 2021-05-02: 10:00:00 10 mg
  Filled 2021-05-01: qty 1

## 2021-05-01 MED ORDER — AMLODIPINE BESYLATE 5 MG PO TABS
5.0000 mg | ORAL_TABLET | Freq: Once | ORAL | Status: AC
  Administered 2021-05-01: 5 mg

## 2021-05-01 NOTE — Progress Notes (Signed)
Family Medicine Teaching Service Daily Progress Note Intern Pager: [phone]  Patient name: Jill Buchanan Medical record number: [id number] Date of birth: 1961/04/20 Age: 60 y.o. Gender: female  Primary Care Provider: Pcp, No Consultants: GI, Neuro, Nutrition, Palliative Care Code Status: DNR   Pt Overview and Major Events to Date:  11/29- admitted 12/1- Code Status changed to DNR  Assessment and Plan: Jill Buchanan is a 60yo female presenting from SNF with AMS and fever, found to be COVID positive and anemic w/ FOBT positive, subesquently found to have acute CVA. PMH significant for A Fib on DOAC, CAD s/p CABG, CVA (October 2022), dysphagia w/ PEG in place.   Acute CVA  Hx of recurrent stroke  [facility] Neurology recommends keeping her on Eliquis.  Not currently a candidate for Pradaxa.  Previously failed on Xarelto. - Continue goals of care conversation - Continue Eliquis and aspirin per neuro - No changes   Acute Blood Loss Anemia; improved Hemoglobin stable after starting Eliquis.  Continues to be stable today.  Patient is s/p IV iron x1. - A.m. CBC - Consider stable for discharge to SNF if a.m. CBC stable   Acute Hypoxic Respiratory Failure 2/2 COVID-19 infection; resolved Currently on room air.  Today is last day of remdesivir. - Continue Decadron, day #5/10   Dysphagia s/p CVA, PEG in place Appears euvolemic.  Sodium trending downward slowly, will adjust free water flushes. - Free water flushes 120 mL every 6 hours - No changes to feeds and meds  A Fib on DOAC Rate control.  Hemoglobin stable.  No changes to plan.    FEN/GI: Tube feeding  PPx: SCDs Start: 04/27/21 1703 Dispo:SNF in 2-3 days. Barriers include stable hemoglobin and sodium.   Subjective:  Jill Buchanan was seen this AM.  Continues to be nonverbal, but is able to wiggle toes on command.  Otherwise does not respond.  Objective: Temp:  [97.3 F (36.3 C)-98.4 F (36.9 C)] 98.4 F (36.9 C) (12/03  2047) Pulse Rate:  [56-66] 65 (12/03 2047) Resp:  [14-20] 19 (12/03 2047) BP: (147-187)/(62-92) 184/92 (12/03 2047) SpO2:  [98 %-100 %] 99 % (12/03 2047) Physical Exam Constitutional:      Appearance: She is not ill-appearing or diaphoretic.  Cardiovascular:     Rate and Rhythm: Normal rate and regular rhythm.  Pulmonary:     Effort: Pulmonary effort is normal. No respiratory distress.     Breath sounds: Normal breath sounds. No wheezing.  Abdominal:     General: There is no distension.  Skin:    Coloration: Skin is pale.     Laboratory: Recent Labs  Lab 04/29/21 0244 04/29/21 1827 04/30/21 0220 04/30/21 1831 05/01/21 0614 05/01/21 1204  WBC 8.9  --  8.2  --  9.5  --   HGB 9.4*   < > 9.1* 9.4* 8.3* 9.4*  HCT 28.3*   < > 27.0* 27.9* 25.6* 28.5*  PLT 194  --  164  --  192  --    < > = values in this interval not displayed.   Recent Labs  Lab 04/27/21 1143 04/28/21 0337 04/29/21 0244 05/01/21 0614  NA 146* 139 135 134*  K 4.3 4.2 3.7 3.7  CL 111 106 100 100  CO2 25 23 24 23   BUN 32* 23 29* 28*  CREATININE 0.96 0.75 0.83 0.71  CALCIUM 9.6 9.4 9.0 8.3*  PROT 6.9 6.8  --   --   BILITOT 0.7 1.1  --   --  ALKPHOS 70 68  --   --   ALT 22 19  --   --   AST 29 30  --   --   GLUCOSE 131* 132* 130* 147*   Results for orders placed or performed during the hospital encounter of 04/27/21 (from the past 24 hour(s))  Glucose, capillary     Status: Abnormal   Collection Time: 05/01/21  1:25 AM  Result Value Ref Range   Glucose-Capillary 117 (H) 70 - 99 mg/dL  Glucose, capillary     Status: Abnormal   Collection Time: 05/01/21  5:11 AM  Result Value Ref Range   Glucose-Capillary 143 (H) 70 - 99 mg/dL  CBC     Status: Abnormal   Collection Time: 05/01/21  6:14 AM  Result Value Ref Range   WBC 9.5 4.0 - 10.5 K/uL   RBC 3.06 (L) 3.87 - 5.11 MIL/uL   Hemoglobin 8.3 (L) 12.0 - 15.0 g/dL   HCT 26.8 (L) 34.1 - 96.2 %   MCV 83.7 80.0 - 100.0 fL   MCH 27.1 26.0 - 34.0 pg    MCHC 32.4 30.0 - 36.0 g/dL   RDW 22.9 79.8 - 92.1 %   Platelets 192 150 - 400 K/uL   nRBC 0.2 0.0 - 0.2 %  Basic metabolic panel     Status: Abnormal   Collection Time: 05/01/21  6:14 AM  Result Value Ref Range   Sodium 134 (L) 135 - 145 mmol/L   Potassium 3.7 3.5 - 5.1 mmol/L   Chloride 100 98 - 111 mmol/L   CO2 23 22 - 32 mmol/L   Glucose, Bld 147 (H) 70 - 99 mg/dL   BUN 28 (H) 8 - 23 mg/dL   Creatinine, Ser 1.94 0.44 - 1.00 mg/dL   Calcium 8.3 (L) 8.9 - 10.3 mg/dL   GFR, Estimated >17 >40 mL/min   Anion gap 11 5 - 15  Glucose, capillary     Status: Abnormal   Collection Time: 05/01/21  8:09 AM  Result Value Ref Range   Glucose-Capillary 105 (H) 70 - 99 mg/dL  Glucose, capillary     Status: Abnormal   Collection Time: 05/01/21 11:24 AM  Result Value Ref Range   Glucose-Capillary 138 (H) 70 - 99 mg/dL  Hemoglobin and hematocrit, blood     Status: Abnormal   Collection Time: 05/01/21 12:04 PM  Result Value Ref Range   Hemoglobin 9.4 (L) 12.0 - 15.0 g/dL   HCT 81.4 (L) 48.1 - 85.6 %  Glucose, capillary     Status: Abnormal   Collection Time: 05/01/21  4:13 PM  Result Value Ref Range   Glucose-Capillary 149 (H) 70 - 99 mg/dL  Glucose, capillary     Status: Abnormal   Collection Time: 05/01/21  9:24 PM  Result Value Ref Range   Glucose-Capillary 141 (H) 70 - 99 mg/dL    Imaging/Diagnostic Tests: None last 24 hours.   Fayette Pho, MD  05/01/2021, 10:08 PM PGY-2, [facility] FPTS Intern pager: [phone], text pages welcome

## 2021-05-01 NOTE — Progress Notes (Signed)
FPTS Brief Progress Note  S: Patient sleeping comfortably at the time of evening rounds. I did not wake her.  Spoke with primary RN who does not have any concerns at this time.   O: BP (!) 165/86 (BP Location: Right Arm)   Pulse 63   Temp (!) 97.5 F (36.4 C) (Axillary)   Resp 16   Ht 5\' 3"  (1.6 m)   Wt 50.2 kg   SpO2 99%   BMI 19.60 kg/m   Gen: sleeping comfortably, NAD Resp: equal chest rise noted  A/P: Anemia  FOBT positive stool Hgb stable at 9.4 after restarting Eliquis.  s/p IV iron today. -Trend CBC  -Orders reviewed. Labs for AM ordered, which was adjusted as needed.   Remainder per daily progress note.  , MD 05/01/2021, 12:41 AM PGY-2,  Family Medicine Night Resident  Please page [phone] with questions.

## 2021-05-01 NOTE — Plan of Care (Signed)
  Problem: Health Behavior/Discharge Planning: Goal: Ability to manage health-related needs will improve Outcome: Progressing   

## 2021-05-02 DIAGNOSIS — I639 Cerebral infarction, unspecified: Secondary | ICD-10-CM

## 2021-05-02 LAB — GLUCOSE, CAPILLARY
Glucose-Capillary: 102 mg/dL — ABNORMAL HIGH (ref 70–99)
Glucose-Capillary: 120 mg/dL — ABNORMAL HIGH (ref 70–99)
Glucose-Capillary: 135 mg/dL — ABNORMAL HIGH (ref 70–99)
Glucose-Capillary: 139 mg/dL — ABNORMAL HIGH (ref 70–99)
Glucose-Capillary: 88 mg/dL (ref 70–99)

## 2021-05-02 LAB — BASIC METABOLIC PANEL
Anion gap: 7 (ref 5–15)
BUN: 26 mg/dL — ABNORMAL HIGH (ref 8–23)
CO2: 25 mmol/L (ref 22–32)
Calcium: 8.5 mg/dL — ABNORMAL LOW (ref 8.9–10.3)
Chloride: 99 mmol/L (ref 98–111)
Creatinine, Ser: 0.5 mg/dL (ref 0.44–1.00)
GFR, Estimated: >60 mL/min (ref >60)
Glucose, Bld: 135 mg/dL — ABNORMAL HIGH (ref 70–99)
Potassium: 3.6 mmol/L (ref 3.5–5.1)
Sodium: 131 mmol/L — ABNORMAL LOW (ref 135–145)

## 2021-05-02 LAB — CBC
HCT: 28.9 % — ABNORMAL LOW (ref 36.0–46.0)
Hemoglobin: 9.8 g/dL — ABNORMAL LOW (ref 12.0–15.0)
MCH: 27.8 pg (ref 26.0–34.0)
MCHC: 33.9 g/dL (ref 30.0–36.0)
MCV: 81.9 fL (ref 80.0–100.0)
Platelets: 209 10*3/uL (ref 150–400)
RBC: 3.53 MIL/uL — ABNORMAL LOW (ref 3.87–5.11)
RDW: 14.2 % (ref 11.5–15.5)
WBC: 13.5 10*3/uL — ABNORMAL HIGH (ref 4.0–10.5)
nRBC: 0.3 % — ABNORMAL HIGH (ref 0.0–0.2)

## 2021-05-02 LAB — CULTURE, BLOOD (ROUTINE X 2)
Culture: NO GROWTH
Culture: NO GROWTH
Special Requests: ADEQUATE

## 2021-05-02 MED ORDER — ACETAMINOPHEN 500 MG PO TABS
500.0000 mg | ORAL_TABLET | ORAL | 0 refills | Status: AC | PRN
Start: 2021-05-02 — End: ?

## 2021-05-02 MED ORDER — ASPIRIN 81 MG PO CHEW: 81.0000 mg | CHEWABLE_TABLET | Freq: Every day | ORAL | Status: DC

## 2021-05-02 MED ORDER — OSMOLITE 1.2 CAL PO LIQD: 1000.0000 mL | ORAL | 0 refills | Status: DC

## 2021-05-02 MED ORDER — CARVEDILOL 25 MG PO TABS: 25.0000 mg | ORAL_TABLET | Freq: Two times a day (BID) | ORAL | Status: DC

## 2021-05-02 MED ORDER — SENNOSIDES-DOCUSATE SODIUM 8.6-50 MG PO TABS: 1.0000 | ORAL_TABLET | Freq: Every evening | ORAL | Status: DC | PRN

## 2021-05-02 MED ORDER — APIXABAN 5 MG PO TABS: 5.0000 mg | ORAL_TABLET | Freq: Two times a day (BID) | ORAL | Status: DC

## 2021-05-02 MED ORDER — MULTI-VITAMIN/MINERALS PO TABS: 1.0000 | ORAL_TABLET | Freq: Every day | ORAL | Status: DC

## 2021-05-02 MED ORDER — PROSOURCE TF PO LIQD: 45.0000 mL | Freq: Every day | ORAL | Status: DC

## 2021-05-02 MED ORDER — DEXAMETHASONE 6 MG PO TABS: 6.0000 mg | ORAL_TABLET | Freq: Every day | ORAL | 0 refills | Status: DC

## 2021-05-02 MED ORDER — POLYETHYLENE GLYCOL 3350 17 G PO PACK
17.0000 g | PACK | Freq: Every day | ORAL | 0 refills | Status: DC | PRN
Start: 2021-05-02 — End: 2021-07-10

## 2021-05-02 MED ORDER — ATORVASTATIN CALCIUM 80 MG PO TABS: 80.0000 mg | ORAL_TABLET | Freq: Every day | ORAL | Status: DC

## 2021-05-02 MED ORDER — PANTOPRAZOLE SODIUM 40 MG PO PACK: 40.0000 mg | PACK | Freq: Every day | ORAL | Status: DC

## 2021-05-02 MED ORDER — AMLODIPINE BESYLATE 10 MG PO TABS: 10.0000 mg | ORAL_TABLET | Freq: Every day | ORAL | Status: DC

## 2021-05-02 MED ORDER — POLYETHYLENE GLYCOL 3350 17 G PO PACK: 17.0000 g | PACK | Freq: Every day | ORAL | Status: DC | PRN

## 2021-05-02 NOTE — Plan of Care (Signed)
  Problem: Clinical Measurements: Goal: Respiratory complications will improve Outcome: Progressing   Problem: Nutrition: Goal: Adequate nutrition will be maintained Outcome: Progressing   Problem: Elimination: Goal: Will not experience complications related to bowel motility Outcome: Progressing   

## 2021-05-02 NOTE — Progress Notes (Signed)
FPTS Interim Night Progress Note  S:Patient sleeping comfortably.  Rounded with primary night RN.  Loose stool so held Miralax.  Now with rectal pouch in place.  No orders required.    O: Today's Vitals   05/01/21 1600 05/01/21 1608 05/01/21 1945 05/01/21 2047  BP: (!) 162/75 (!) 163/62  (!) 184/92  Pulse: 61 (!) 56  65  Resp: 14 16  19   Temp:  98.2 F (36.8 C)  98.4 F (36.9 C)  TempSrc:  Oral  Axillary  SpO2: 100% 98%  99%  Weight:      Height:      PainSc:   0-No pain       A/P: Continue to wean oxygen  Maintain O2 sats> 92% Elevated BP overnight.  Amlodipine increased to 10 mg by day staff.  Will continue to monitor Will switch bowel regime to prn  Continue to monitor  MD PGY-3, [facility] Family Medicine Service pager [phone]

## 2021-05-02 NOTE — TOC Transition Note (Addendum)
Transition of Care [facility]) - CM/SW Discharge Note   Patient Details  Name: Jill Buchanan MRN: [id number] Date of Birth: Nov 02, 1960  [facility]) CM/SW Contact:  Verna Czech [place], [place] Phone Number: 05/02/2021, 12:51 PM   Clinical Narrative:    Return call from Admissions Director at [facility]. Patient can return to [facility] today, will be going to room 109E. RN to call report to [phone]. No covid test required. Please send discharge summary with her. Patient to be transported by [facility].  2:36pm [facility] contacted patient next on list for transport.  4:30pm [name] notified regarding patient's discharge back to [facility] via [facility] today.  Dorla Guizar, LCSW Transition of Care [phone]    Final next level of care: Skilled Nursing Facility Barriers to Discharge: Continued Medical Work up   Patient Goals and CMS Choice Patient states their goals for this hospitalization and ongoing recovery are:: Return to SNF      Discharge Placement                       Discharge Plan and Services In-house Referral: Clinical Social Work   Post Acute Care Choice: Skilled Nursing Facility                               Social Determinants of Health (SDOH) Interventions     Readmission Risk Interventions No flowsheet data found.

## 2021-05-02 NOTE — Progress Notes (Signed)
Report given to Scientist, physiological at [facility] nursing facility.

## 2021-05-02 NOTE — Progress Notes (Signed)
Family Medicine Teaching Service Daily Progress Note Intern Pager: [phone]  Patient name: Jill Buchanan Medical record number: [id number] Date of birth: August 31, 1960 Age: 60 y.o. Gender: female  Primary Care Provider: Pcp, No Consultants: Neuro, GI, nutrition, palliative care Code Status: DNR  Pt Overview and Major Events to Date:  11/29-admitted 12/1 CODE STATUS changed to DNR  Assessment and Plan:  Jill Buchanan is a 60yo female presenting from SNF with AMS and fever, found to be COVID positive and anemic w/ FOBT positive, subesquently found to have acute CVA. PMH significant for A Fib on DOAC, CAD s/p CABG, CVA (October 2022), dysphagia w/ PEG in place.    Acute CVA w/ Hx of recurrent strokes  [facility] Neurology has been consulted and they recommend keeping her on Eliquis.  Is not a current for Pradaxa.  Previously failed Xarelto. - Continue goals of care conversations - Continue Eliquis and aspirin per neuro - No changes - Currently stable for discharge to SNF  Acute blood loss anemia Hemoglobin remained stable after starting the Eliquis.  S/p IV iron x1.  Hemoglobin this morning 9.8 from 9.4 yesterday. - Continue morning CBCs - Stable for discharge to SNF  Acute hypoxic respiratory failure 2/2 COVID-19 infection Currently on room air.  Finished her remdesivir course 12/3. - Continue Decadron, day 6/10  Dysphagia s/p CVA with PEG tube in place Euvolemic at this time.  Sodium continues to drop today down to 131 from 134 yesterday. - Discontinue free water flushes -Continue to monitor hydration status   A. fib on DOAC Rate controlled, hemoglobin stable, no changes to plan.  FEN/GI: Tube feeding PPx: SCDs Dispo:SNF as soon as 1 is available. Barriers include placement.   Subjective:  Patient nonverbal this morning.  Was wiggling fingers and toes but not following commands.  Objective: Temp:  [98 F (36.7 C)-98.4 F (36.9 C)] 98 F (36.7 C) (12/04 0400) Pulse  Rate:  [56-66] 63 (12/04 0400) Resp:  [14-19] 16 (12/04 0400) BP: (135-195)/(62-92) 135/70 (12/04 0400) SpO2:  [98 %-100 %] 99 % (12/04 0400) Weight:  [51.1 kg] 51.1 kg (12/04 0544) Physical Exam: General: Chronically ill-appearing 60 year old female Cardiovascular: Regular rate and rhythm, no murmurs appreciated Respiratory: Normal work of breathing, lungs are clear to auscultation bilaterally Abdomen: Soft, positive bowel sounds Extremities: No edema appreciated  Laboratory: Recent Labs  Lab 04/30/21 0220 04/30/21 1831 05/01/21 0614 05/01/21 1204 05/02/21 0031  WBC 8.2  --  9.5  --  13.5*  HGB 9.1*   < > 8.3* 9.4* 9.8*  HCT 27.0*   < > 25.6* 28.5* 28.9*  PLT 164  --  192  --  209   < > = values in this interval not displayed.   Recent Labs  Lab 04/27/21 1143 04/28/21 0337 04/29/21 0244 05/01/21 0614 05/02/21 0031  NA 146* 139 135 134* 131*  K 4.3 4.2 3.7 3.7 3.6  CL 111 106 100 100 99  CO2 25 23 24 23 25   BUN 32* 23 29* 28* 26*  CREATININE 0.96 0.75 0.83 0.71 0.50  CALCIUM 9.6 9.4 9.0 8.3* 8.5*  PROT 6.9 6.8  --   --   --   BILITOT 0.7 1.1  --   --   --   ALKPHOS 70 68  --   --   --   ALT 22 19  --   --   --   AST 29 30  --   --   --   GLUCOSE 131* 132*  130* 147* 135*    Imaging/Diagnostic Tests: No results found.   Derrel Nip, MD 05/02/2021, 7:23 AM PGY-3, [facility] Family Medicine FPTS Intern pager: [phone], text pages welcome

## 2021-05-02 NOTE — TOC Progression Note (Signed)
Transition of Care [facility]) - Progression Note    Patient Details  Name: Jill Buchanan MRN: [id number] Date of Birth: 1960/08/05  Transition of Care [facility]) CM/SW Contact  Kirsi Hugh, [place], [place] Phone Number:[phone] 05/02/2021, 12:40 PM  Clinical Narrative:    Phone call to the admissions department at [facility] [phone] to coordinate patient's return. VM left for a return call.  Gaetan Spieker, LCSW Transitions of Care [phone]    Expected Discharge Plan: Skilled Nursing Facility Barriers to Discharge: Continued Medical Work up  Expected Discharge Plan and Services Expected Discharge Plan: Skilled Nursing Facility In-house Referral: Clinical Social Work   Post Acute Care Choice: Skilled Nursing Facility Living arrangements for the past 2 months: Skilled Nursing Facility                                       Social Determinants of Health (SDOH) Interventions    Readmission Risk Interventions No flowsheet data found.

## 2021-05-02 NOTE — Discharge Summary (Signed)
[facility] Discharge Summary  Patient name: Jill Buchanan Medical record number: [id number] Date of birth: 1960/08/12 Age: 60 y.o. Gender: female Date of Admission: 04/27/2021  Date of Discharge: 05/02/2021 Admitting Physician: Holley Bouche, MD  Primary Care Provider: Pcp, No Consultants: GI, neuro  Indication for Hospitalization: Acute hypoxic respiratory 2/2 COVID  Discharge Diagnoses/Problem List:  Acute CVA with history of recurrent strokes Acute blood loss anemia Acute hypoxic respiratory failure 2/2 COVID-19 infection Dysphagia s/p CVA with PEG tube in place A. fib on DOAC  Disposition: SNF Discharge Condition: Stable  Discharge Exam:   Temp:  [98 F (36.7 C)-98.4 F (36.9 C)] 98 F (36.7 C) (12/04 0400) Pulse Rate:  [56-66] 63 (12/04 0400) Resp:  [14-19] 16 (12/04 0400) BP: (135-195)/(62-92) 135/70 (12/04 0400) SpO2:  [98 %-100 %] 99 % (12/04 0400) Weight:  [51.1 kg] 51.1 kg (12/04 0544) Physical Exam: General: Chronically ill-appearing 60 year old female Cardiovascular: Regular rate and rhythm, no murmurs appreciated Respiratory: Normal work of breathing, lungs are clear to auscultation bilaterally Abdomen: Soft, positive bowel sounds Extremities: No edema appreciated  Brief Hospital Course:  Jill Buchanan is a 60 y.o.female with a history of A. fib on DOAC, CAD status post CABG, CVA x5 (last month), dysphagia with PEG who was admitted to the [facility] Teaching Service at [facility] for AMS and fever. Her hospital course is detailed below:  Sepsis secondary to COVID  acute hypoxic respiratory failure Presented from SNF via EMS with AMS and fever (100.4), tachypnea, hypoxemia _0 %. Code sepsis activated, given LR bolus, cultures drawn, started on IV cefepime and Vancomycin. Anemic at 7.6 with FOBT+, found to be COVID+. CXR, UA unremarkable. Started on Decadron and Remdesivir in addition to mIVF due to presenting hypovolemic.   Over the next few days her symptoms improved and her hypoxia decreased.  She was weaned off of oxygen on day 5 of the illness.  She completed her course of remdesivir prior to discharge and completed 6/10 days of Decadron prior to discharge.  She will complete the 10-day treatment course at SNF. Marland Kitchen AMS Alertness and responsiveness waxes and wanes, non-verbal at baseline due to CVA x5 in previous month. CT head suspicious for acute to subacute infarct. MR brain showed atrophy and extensive ischemic changes throughout. 2 cm region of acute infarction within the left basal ganglia and internal capsule. Smaller areas of acute infarction within both caudate bodies, the cerebellum and the thalami. Old bilateral parietal cortical and subcortical infarctions. There could possibly be some acute infarction adjacent to the left parietal old infarction. This pattern of disease could be due to global hypoperfusion, embolic disease or vasculitis. Neurology was consulted and there was discussion regarding initiating Pradaxa.  The patient has already failed Xarelto.  Due to the fact that the patient cannot swallow she is not a candidate for Pradaxa so Eliquis and 81 mg aspirin were started.  Anemia likely 2/2 GI bleed Hgb 7.6 on admission. Received 2u pRBC transfusion total.  GI was consulted and they recommended monitoring for stable hemoglobin and once hemoglobin was stable restarting anticoagulation due to stroke risk.  Recommended observation rather than diagnostic EGD and if bleeding started they could plan for therapeutic EGD.  She was given dose of IV iron and pantoprazole.  Her hemoglobin remained stable throughout the rest of the hospitalization after reinitiation of anticoagulation.  Goals of care discussion Palliative care was consulted during the hospitalization.  Extensive discussion occurred between neurology, palliative care, primary team, and the  patient and it was decided for the patient to move to DNR  status.  CODE STATUS has been changed.  Other chronic conditions were medically managed with home medications and formulary alternatives as necessary (A. Fib, HLD)  PCP Follow-up Recommendations: Recommend repeat BMP in 1 week to assess sodium levels.  We were giving free water intermittently throughout the hospitalization and noticed downtrending sodium to 131 prior to discharge. Discussed patient's discharge with palliative care and they report that someone will contact the patient's daughter to line up outpatient palliative care meeting Patient will need to continue Decadron after discharge with her last dose being on 12/8   Significant Procedures: None  Significant Labs and Imaging:  Recent Labs  Lab 04/30/21 0220 04/30/21 1831 05/01/21 0614 05/01/21 1204 05/02/21 0031  WBC 8.2  --  9.5  --  13.5*  HGB 9.1*   < > 8.3* 9.4* 9.8*  HCT 27.0*   < > 25.6* 28.5* 28.9*  PLT 164  --  192  --  209   < > = values in this interval not displayed.   Recent Labs  Lab 04/27/21 1143 04/28/21 0337 04/29/21 0244 05/01/21 0614 05/02/21 0031  NA 146* 139 135 134* 131*  K 4.3 4.2 3.7 3.7 3.6  CL 111 106 100 100 99  CO2 _0 GLUCOSE 131* 132* 130* 147* 135*  BUN 32* 23 29* 28* 26*  CREATININE 0.96 0.75 0.83 0.71 0.50  CALCIUM 9.6 9.4 9.0 8.3* 8.5*  ALKPHOS 70 68  --   --   --   AST 29 30  --   --   --   ALT 22 19  --   --   --   ALBUMIN 3.2* 3.0*  --   --   --     CT ANGIO HEAD NECK W WO CM  Result Date: 04/30/2021 CLINICAL DATA:  Stroke follow-up EXAM: CT ANGIOGRAPHY HEAD AND NECK TECHNIQUE: Multidetector CT imaging of the head and neck was performed using the standard protocol during bolus administration of intravenous contrast. Multiplanar CT image reconstructions and MIPs were obtained to evaluate the vascular anatomy. Carotid stenosis measurements (when applicable) are obtained utilizing NASCET criteria, using the distal internal carotid diameter as the denominator.  CONTRAST:  12m OMNIPAQUE IOHEXOL 350 MG/ML SOLN COMPARISON:  Brain MRI 04/28/2021 FINDINGS: CT HEAD FINDINGS Brain: There is no mass, hemorrhage or extra-axial collection. There is generalized atrophy without lobar predilection. There are multiple old small vessel infarcts and unchanged old bilateral parietal cortical infarcts. There is hypoattenuation of the periventricular white matter, most commonly indicating chronic ischemic microangiopathy. Skull: The visualized skull base, calvarium and extracranial soft tissues are normal. Sinuses/Orbits: No fluid levels or advanced mucosal thickening of the visualized paranasal sinuses. No mastoid or middle ear effusion. Bilateral phthisis bulbi. CTA NECK FINDINGS SKELETON: There is no bony spinal canal stenosis. No lytic or blastic lesion. OTHER NECK: Markedly enlarged adenoid tonsils, unusual for age. UPPER CHEST: No pneumothorax or pleural effusion. No nodules or masses. AORTIC ARCH: There is calcific atherosclerosis of the aortic arch. There is no aneurysm, dissection or hemodynamically significant stenosis of the visualized portion of the aorta. Conventional 3 vessel aortic branching pattern. The visualized proximal subclavian arteries are widely patent. RIGHT CAROTID SYSTEM: Wavy, undulating pattern of the ICA proximal to the skull base. There is low density atherosclerosis extending into the proximal ICA, resulting in 50% stenosis. LEFT CAROTID SYSTEM: Undulating appearance of the ICA in the upper  neck. There is low density atherosclerosis extending into the proximal ICA, resulting in 50% stenosis. VERTEBRAL ARTERIES: Right dominant configuration. Both origins are clearly patent. There is no dissection, occlusion or flow-limiting stenosis to the skull base (V1-V3 segments). CTA HEAD FINDINGS POSTERIOR CIRCULATION: --Vertebral arteries: Normal V4 segments. --Inferior cerebellar arteries: Normal. --Basilar artery: Normal. --Superior cerebellar arteries: Normal.  --Posterior cerebral arteries (PCA): Normal. ANTERIOR CIRCULATION: --Intracranial internal carotid arteries: There is a 7 mm left clinoid segment aneurysm (series 10, image 208). There is fusiform dilatation of the clinoid segment of the right ICA, measuring 5 mm. --Anterior cerebral arteries (ACA): Normal. Both A1 segments are present. Patent anterior communicating artery (a-comm). --Middle cerebral arteries (MCA): Normal. VENOUS SINUSES: As permitted by contrast timing, patent. ANATOMIC VARIANTS: None Review of the MIP images confirms the above findings. IMPRESSION: 1. No intracranial arterial occlusion or high-grade stenosis. 2. 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 5 mm fusiform dilatation of the left ICA clinoid segment. 3. Wavy, undulating appearance of the internal carotid arteries in the upper neck, consistent with fibromuscular dysplasia. 4. 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 5. Markedly enlarged adenoid tonsils, unusual for age. This may indicate tonsillopharyngitis, though lymphoproliferative disorder is also of concern. 6. No acute intracranial abnormality. Multiple old small vessel infarcts and old bilateral parietal cortical infarcts. Aortic Atherosclerosis (ICD10-I70.0). Electronically Signed   By: Ulyses Jarred M.D.   On: 04/30/2021 03:24   CT HEAD WO CONTRAST (5MM)  Result Date: 04/27/2021 CLINICAL DATA:  Altered mental status EXAM: CT HEAD WITHOUT CONTRAST TECHNIQUE: Contiguous axial images were obtained from the base of the skull through the vertex without intravenous contrast. COMPARISON:  None. FINDINGS: Brain: No hemorrhage or intracranial mass. Atrophy and chronic small vessel ischemic changes of the white matter. Chronic appearing bilateral parietal and right temporal infarcts. Small chronic appearing infarcts within the bilateral basal ganglia. Chronic lacunar infarct in the right thalamus. Hypodensity within the left basal ganglia consistent with more  acute or subacute infarct. Vascular: No hyperdense vessels.  Carotid vascular calcification Skull: No fracture Sinuses/Orbits: No acute finding. 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 Multiple coarse right globe calcifications. Other: None IMPRESSION: 1. Hypodensity within the left basal ganglia, suspicious for acute to subacute infarct. There is no hemorrhage. 2. Multiple chronic appearing infarcts within the bilateral basal ganglia, right thalamus, parietal lobes and right temporal lobe. 3. Atrophy and mild chronic small vessel ischemic changes of the white matter. Electronically Signed   By: Donavan Foil M.D.   On: 04/27/2021 21:39   MR BRAIN WO CONTRAST  Result Date: 04/28/2021 CLINICAL DATA:  Neuro deficit, acute, stroke suspected. COVID sepsis. Decreased responsiveness. Blindness and dysarthria. EXAM: MRI HEAD WITHOUT CONTRAST TECHNIQUE: Multiplanar, multiecho pulse sequences of the brain and surrounding structures were obtained without intravenous contrast. COMPARISON:  Head CT yesterday. FINDINGS: Brain: There is a background pattern of chronic ischemic changes affecting the brainstem, cerebellum, thalami, basal ganglia, cerebral hemispheric white matter and right inferior parietal cortical brain and left parietal cortical brain. There are numerous foci of restricted diffusion consistent with acute infarctions. The largest area affects the left basal ganglia and internal capsule, measuring about 2 cm in size. Other smaller acute infarctions affect the middle cerebellar peduncle region on the right, punctate focus in the left cerebellum, smudgy foci within both thalami, areas of acute infarction within the deep white matter and both caudate bodies. I think there is T2 shine through associated with old bilateral parietal cortical and subcortical  infarctions. No evidence of hemorrhage. No hydrocephalus or extra-axial collection. Vascular: Major vessels at the base of the brain show flow. Skull and upper cervical  spine: Negative Sinuses/Orbits: Sinuses are clear.  Bilateral phthisis bulbi. Other: None IMPRESSION: Background pattern of atrophy and extensive ischemic changes throughout the brain. 2 cm region of acute infarction within the left basal ganglia and internal capsule. Smaller areas of acute infarction within both caudate bodies, the cerebellum and the thalami. Old bilateral parietal cortical and subcortical infarctions with what I think is some T2 shine through. There could possibly be some acute infarction adjacent to the left parietal old infarction. This pattern of disease could be due to global hypoperfusion, embolic disease or vasculitis. Electronically Signed   By: Nelson Chimes M.D.   On: 04/28/2021 10:59   DG Pelvis Portable  Result Date: 04/28/2021 CLINICAL DATA:  Altered mental status EXAM: PORTABLE PELVIS 1-2 VIEWS COMPARISON:  None. FINDINGS: There is no evidence of pelvic fracture or diastasis. No pelvic bone lesions are seen. IMPRESSION: Negative. Electronically Signed   By: Delanna Ahmadi M.D.   On: 04/28/2021 09:16   DG Chest Port 1 View  Result Date: 04/27/2021 CLINICAL DATA:  Sepsis. EXAM: PORTABLE CHEST 1 VIEW COMPARISON:  None. FINDINGS: Previous median sternotomy and aortic valve repair heart size appears normal. No pleural effusion or interstitial edema. No airspace opacities identified. The visualized osseous structures are unremarkable. IMPRESSION: No acute cardiopulmonary abnormalities. Electronically Signed   By: Kerby Moors M.D.   On: 04/27/2021 12:42   DG Abd Portable 1V  Result Date: 04/28/2021 CLINICAL DATA:  Altered mental status EXAM: PORTABLE ABDOMEN - 1 VIEW COMPARISON:  None. FINDINGS: The bowel gas pattern is normal. Moderate burden of stool throughout the colon. No obvious free air on supine radiographs. No radio-opaque calculi or other significant radiographic abnormality are seen. IMPRESSION: Nonobstructive pattern of bowel gas. Moderate burden of stool  throughout the colon. Electronically Signed   By: Delanna Ahmadi M.D.   On: 04/28/2021 09:14   ECHOCARDIOGRAM COMPLETE  Result Date: 04/29/2021    ECHOCARDIOGRAM REPORT   Patient Name:   [facility] Date of Exam: 04/29/2021 Medical Rec #:  [id number]       Height:       63.0 in Accession #:    [id number]      Weight:       108.5 lb Date of Birth:  03-01-1961      BSA:          1.491 m Patient Age:    82 years        BP:           123/61 mmHg Patient Gender: F               HR:           58 bpm. Exam Location:  Inpatient Procedure: 2D Echo, Cardiac Doppler and Color Doppler Indications:    Stroke  History:        Patient has no prior history of Echocardiogram examinations.                 Arrythmias:Atrial Fibrillation. COVID-19.  Sonographer:    Maudry Mayhew MHA, Pringle, RVT, RDCS Referring Phys: [id number] [facility]  Sonographer Comments: Image acquisition challenging due to patient body habitus, Image acquisition challenging due to uncooperative patient and Image acquisition challenging due to respiratory motion. IMPRESSIONS  1. 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  2. Left ventricular  ejection fraction, by estimation, is 60 to 65%. The left ventricle has normal function. The left ventricle has no regional wall motion abnormalities. There is moderate left ventricular hypertrophy. Left ventricular diastolic function  could not be evaluated.  3. Right ventricular systolic function is normal. The right ventricular size is normal.  4. Left atrial size was severely dilated.  5. The mitral valve is normal in structure. Trivial mitral valve regurgitation. No evidence of mitral stenosis. Severe mitral annular calcification.  6. The aortic valve is tricuspid. Aortic valve regurgitation is not visualized. No aortic stenosis is present.  7. The inferior vena cava is normal in size with greater than 50% respiratory variability, suggesting right atrial pressure of 3 mmHg. Comparison(s): No prior  Echocardiogram. FINDINGS  Left Ventricle: Left ventricular ejection fraction, by estimation, is 60 to 65%. The left ventricle has normal function. The left ventricle has no regional wall motion abnormalities. The left ventricular internal cavity size was normal in size. There is  moderate left ventricular hypertrophy. Left ventricular diastolic function could not be evaluated due to atrial fibrillation. Left ventricular diastolic function could not be evaluated. Right Ventricle: The right ventricular size is normal. Right ventricular systolic function is normal. Left Atrium: Left atrial size was severely dilated. Right Atrium: Right atrial size was normal in size. Pericardium: There is no evidence of pericardial effusion. Mitral Valve: The mitral valve is normal in structure. Severe mitral annular calcification. Trivial mitral valve regurgitation. No evidence of mitral valve stenosis. MV peak gradient, 15.5 mmHg. The mean mitral valve gradient is 4.0 mmHg. Tricuspid Valve: The tricuspid valve is normal in structure. Tricuspid valve regurgitation is mild . No evidence of tricuspid stenosis. Aortic Valve: The aortic valve is tricuspid. Aortic valve regurgitation is not visualized. No aortic stenosis is present. Aortic valve mean gradient measures 4.5 mmHg. Aortic valve peak gradient measures 9.5 mmHg. Aortic valve area, by VTI measures 0.46 cm. Pulmonic Valve: The pulmonic valve was normal in structure. Pulmonic valve regurgitation is trivial. No evidence of pulmonic stenosis. Aorta: The aortic root is normal in size and structure. Venous: The inferior vena cava is normal in size with greater than 50% respiratory variability, suggesting right atrial pressure of 3 mmHg. IAS/Shunts: No atrial level shunt detected by color flow Doppler. 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  LEFT VENTRICLE PLAX 2D LVIDd:         3.40 cm   Diastology LVIDs:         1.90 cm   LV e' medial:    3.30 cm/s  LV PW:         1.20 cm   LV E/e' medial:  53.6 LV IVS:        1.60 cm   LV e' lateral:   6.80 cm/s LVOT diam:     0.90 cm   LV E/e' lateral: 26.0 LV SV:         15 LV SV Index:   10 LVOT Area:     0.64 cm  LEFT ATRIUM             Index        RIGHT ATRIUM           Index LA diam:        3.60 cm 2.41 cm/m   RA Area:     11.60 cm LA Vol (A2C):   85.6 ml 57.42 ml/m  RA Volume:   27.90 ml  18.71 ml/m LA Vol (  A4C):   70.5 ml 47.29 ml/m LA Biplane Vol: 86.1 ml 57.75 ml/m  AORTIC VALVE                     PULMONIC VALVE AV Area (Vmax):    0.40 cm      PR End Diast Vel: 2.38 msec AV Area (Vmean):   0.37 cm AV Area (VTI):     0.46 cm AV Vmax:           154.00 cm/s AV Vmean:          100.650 cm/s AV VTI:            0.336 m AV Peak Grad:      9.5 mmHg AV Mean Grad:      4.5 mmHg LVOT Vmax:         96.77 cm/s LVOT Vmean:        57.967 cm/s LVOT VTI:          0.242 m LVOT/AV VTI ratio: 0.72  AORTA Ao Root diam: 2.55 cm MITRAL VALVE                TRICUSPID VALVE MV Area (PHT): 2.74 cm     TR Peak grad:   14.0 mmHg MV Area VTI:   0.23 cm     TR Vmax:        187.00 cm/s MV Peak grad:  15.5 mmHg MV Mean grad:  4.0 mmHg     SHUNTS MV Vmax:       1.97 m/s     Systemic VTI:  0.24 m MV Vmean:      81.8 cm/s    Systemic Diam: 0.90 cm MV Decel Time: 277 msec MV E velocity: 177.00 cm/s MV A velocity: 129.00 cm/s MV E/A ratio:  1.37 Kirk Ruths MD Electronically signed by Kirk Ruths MD Signature Date/Time: 04/29/2021/4:32:26 PM    Final      Results/Tests Pending at Time of Discharge: None  Discharge Medications:  Allergies as of 05/02/2021       Reactions   Eggs Or Egg-derived Products         Medication List     STOP taking these medications    aspirin EC 81 MG tablet Replaced by: aspirin 81 MG chewable tablet       TAKE these medications    acetaminophen 500 MG tablet Commonly known as: TYLENOL Place 1 tablet (500 mg total) into feeding tube every 4 (four) hours as needed for mild  pain. What changed: how to take this   amLODipine 10 MG tablet Commonly known as: NORVASC Place 1 tablet (10 mg total) into feeding tube daily. What changed: how to take this   apixaban 5 MG Tabs tablet Commonly known as: ELIQUIS Place 1 tablet (5 mg total) into feeding tube 2 (two) times daily. What changed: how to take this   aspirin 81 MG chewable tablet Place 1 tablet (81 mg total) into feeding tube daily. Start taking on: May 03, 2021 Replaces: aspirin EC 81 MG tablet   atorvastatin 80 MG tablet Commonly known as: LIPITOR Place 1 tablet (80 mg total) into feeding tube daily. Start taking on: May 03, 2021 What changed: how to take this   carvedilol 25 MG tablet Commonly known as: COREG Place 1 tablet (25 mg total) into feeding tube 2 (two) times daily. What changed: how to take this   dexamethasone 6 MG tablet Commonly known as: DECADRON Place 1 tablet (6  mg total) into feeding tube daily. Last dose on 12/8 Start taking on: May 03, 2021   feeding supplement (OSMOLITE 1.2 CAL) Liqd Place 1,000 mLs into feeding tube continuous.   feeding supplement (PROSource TF) liquid Place 45 mLs into feeding tube daily. Start taking on: May 03, 2021   multivitamin with minerals tablet Place 1 tablet into feeding tube daily. What changed: how to take this   pantoprazole sodium 40 mg Commonly known as: PROTONIX Place 40 mg into feeding tube daily.   polyethylene glycol 17 g packet Commonly known as: MIRALAX / GLYCOLAX Place 17 g into feeding tube daily as needed for moderate constipation or severe constipation. What changed:  how to take this when to take this reasons to take this   senna-docusate 8.6-50 MG tablet Commonly known as: Senokot-S Place 1 tablet into feeding tube at bedtime as needed for mild constipation. What changed:  how to take this when to take this reasons to take this        Discharge Instructions: Please refer to Patient  Instructions section of EMR for full details.  Patient was counseled important signs and symptoms that should prompt return to medical care, changes in medications, dietary instructions, activity restrictions, and follow up appointments.   Follow-Up Appointments:   Gifford Shave, MD 05/02/2021, 1:29 PM PGY-3, [place]

## 2021-06-25 ENCOUNTER — Emergency Department (HOSPITAL_COMMUNITY): Payer: Medicaid Other

## 2021-06-25 ENCOUNTER — Other Ambulatory Visit: Payer: Self-pay

## 2021-06-25 ENCOUNTER — Inpatient Hospital Stay (HOSPITAL_COMMUNITY)
Admission: EM | Admit: 2021-06-25 | Discharge: 2021-07-03 | DRG: 177 | Disposition: A | Discharge status: Skilled Nursing Facility | Payer: Medicaid Other | Source: Skilled Nursing Facility | Attending: Internal Medicine | Admitting: Internal Medicine

## 2021-06-25 ENCOUNTER — Encounter (HOSPITAL_COMMUNITY): Payer: Self-pay | Admitting: Emergency Medicine

## 2021-06-25 ENCOUNTER — Inpatient Hospital Stay (HOSPITAL_COMMUNITY): Payer: Medicaid Other

## 2021-06-25 DIAGNOSIS — R5381 Other malaise: Secondary | ICD-10-CM | POA: Diagnosis present

## 2021-06-25 DIAGNOSIS — Z91012 Allergy to eggs: Secondary | ICD-10-CM

## 2021-06-25 DIAGNOSIS — G9341 Metabolic encephalopathy: Secondary | ICD-10-CM | POA: Diagnosis present

## 2021-06-25 DIAGNOSIS — I6381 Other cerebral infarction due to occlusion or stenosis of small artery: Secondary | ICD-10-CM | POA: Diagnosis not present

## 2021-06-25 DIAGNOSIS — Z515 Encounter for palliative care: Secondary | ICD-10-CM

## 2021-06-25 DIAGNOSIS — Z681 Body mass index (BMI) 19 or less, adult: Secondary | ICD-10-CM | POA: Diagnosis not present

## 2021-06-25 DIAGNOSIS — J9601 Acute respiratory failure with hypoxia: Secondary | ICD-10-CM | POA: Diagnosis present

## 2021-06-25 DIAGNOSIS — I639 Cerebral infarction, unspecified: Secondary | ICD-10-CM

## 2021-06-25 DIAGNOSIS — I10 Essential (primary) hypertension: Secondary | ICD-10-CM | POA: Diagnosis present

## 2021-06-25 DIAGNOSIS — R4182 Altered mental status, unspecified: Secondary | ICD-10-CM

## 2021-06-25 DIAGNOSIS — N170 Acute kidney failure with tubular necrosis: Secondary | ICD-10-CM | POA: Diagnosis not present

## 2021-06-25 DIAGNOSIS — Z20822 Contact with and (suspected) exposure to covid-19: Secondary | ICD-10-CM | POA: Diagnosis present

## 2021-06-25 DIAGNOSIS — Z8616 Personal history of COVID-19: Secondary | ICD-10-CM

## 2021-06-25 DIAGNOSIS — J690 Pneumonitis due to inhalation of food and vomit: Principal | ICD-10-CM | POA: Diagnosis present

## 2021-06-25 DIAGNOSIS — E860 Dehydration: Secondary | ICD-10-CM | POA: Diagnosis present

## 2021-06-25 DIAGNOSIS — Z952 Presence of prosthetic heart valve: Secondary | ICD-10-CM

## 2021-06-25 DIAGNOSIS — G928 Other toxic encephalopathy: Secondary | ICD-10-CM | POA: Diagnosis not present

## 2021-06-25 DIAGNOSIS — R6521 Severe sepsis with septic shock: Secondary | ICD-10-CM | POA: Diagnosis present

## 2021-06-25 DIAGNOSIS — Z7901 Long term (current) use of anticoagulants: Secondary | ICD-10-CM

## 2021-06-25 DIAGNOSIS — E43 Unspecified severe protein-calorie malnutrition: Secondary | ICD-10-CM | POA: Diagnosis present

## 2021-06-25 DIAGNOSIS — E870 Hyperosmolality and hypernatremia: Secondary | ICD-10-CM | POA: Diagnosis present

## 2021-06-25 DIAGNOSIS — Z1621 Resistance to vancomycin: Secondary | ICD-10-CM | POA: Diagnosis present

## 2021-06-25 DIAGNOSIS — E440 Moderate protein-calorie malnutrition: Secondary | ICD-10-CM | POA: Diagnosis present

## 2021-06-25 DIAGNOSIS — I2510 Atherosclerotic heart disease of native coronary artery without angina pectoris: Secondary | ICD-10-CM | POA: Diagnosis present

## 2021-06-25 DIAGNOSIS — J189 Pneumonia, unspecified organism: Secondary | ICD-10-CM

## 2021-06-25 DIAGNOSIS — R571 Hypovolemic shock: Secondary | ICD-10-CM | POA: Diagnosis present

## 2021-06-25 DIAGNOSIS — R0603 Acute respiratory distress: Secondary | ICD-10-CM | POA: Diagnosis present

## 2021-06-25 DIAGNOSIS — I69391 Dysphagia following cerebral infarction: Secondary | ICD-10-CM

## 2021-06-25 DIAGNOSIS — Z7189 Other specified counseling: Secondary | ICD-10-CM | POA: Diagnosis not present

## 2021-06-25 DIAGNOSIS — N179 Acute kidney failure, unspecified: Secondary | ICD-10-CM | POA: Diagnosis present

## 2021-06-25 DIAGNOSIS — Z951 Presence of aortocoronary bypass graft: Secondary | ICD-10-CM

## 2021-06-25 DIAGNOSIS — E875 Hyperkalemia: Secondary | ICD-10-CM | POA: Diagnosis not present

## 2021-06-25 DIAGNOSIS — E162 Hypoglycemia, unspecified: Secondary | ICD-10-CM | POA: Diagnosis not present

## 2021-06-25 DIAGNOSIS — E861 Hypovolemia: Secondary | ICD-10-CM | POA: Diagnosis present

## 2021-06-25 DIAGNOSIS — H409 Unspecified glaucoma: Secondary | ICD-10-CM | POA: Diagnosis present

## 2021-06-25 DIAGNOSIS — R630 Anorexia: Secondary | ICD-10-CM | POA: Diagnosis present

## 2021-06-25 DIAGNOSIS — Z79899 Other long term (current) drug therapy: Secondary | ICD-10-CM

## 2021-06-25 DIAGNOSIS — E871 Hypo-osmolality and hyponatremia: Secondary | ICD-10-CM | POA: Diagnosis not present

## 2021-06-25 DIAGNOSIS — N390 Urinary tract infection, site not specified: Secondary | ICD-10-CM | POA: Diagnosis present

## 2021-06-25 DIAGNOSIS — D638 Anemia in other chronic diseases classified elsewhere: Secondary | ICD-10-CM | POA: Diagnosis present

## 2021-06-25 DIAGNOSIS — R000 Tachycardia, unspecified: Secondary | ICD-10-CM | POA: Diagnosis present

## 2021-06-25 DIAGNOSIS — Z66 Do not resuscitate: Secondary | ICD-10-CM | POA: Diagnosis present

## 2021-06-25 DIAGNOSIS — I482 Chronic atrial fibrillation, unspecified: Secondary | ICD-10-CM | POA: Diagnosis present

## 2021-06-25 DIAGNOSIS — H547 Unspecified visual loss: Secondary | ICD-10-CM | POA: Diagnosis present

## 2021-06-25 DIAGNOSIS — R627 Adult failure to thrive: Secondary | ICD-10-CM | POA: Diagnosis present

## 2021-06-25 DIAGNOSIS — D649 Anemia, unspecified: Secondary | ICD-10-CM | POA: Diagnosis present

## 2021-06-25 DIAGNOSIS — A419 Sepsis, unspecified organism: Secondary | ICD-10-CM | POA: Diagnosis not present

## 2021-06-25 DIAGNOSIS — Y95 Nosocomial condition: Secondary | ICD-10-CM | POA: Diagnosis present

## 2021-06-25 DIAGNOSIS — E785 Hyperlipidemia, unspecified: Secondary | ICD-10-CM | POA: Diagnosis present

## 2021-06-25 DIAGNOSIS — J398 Other specified diseases of upper respiratory tract: Secondary | ICD-10-CM | POA: Diagnosis not present

## 2021-06-25 DIAGNOSIS — B952 Enterococcus as the cause of diseases classified elsewhere: Secondary | ICD-10-CM | POA: Diagnosis present

## 2021-06-25 DIAGNOSIS — R739 Hyperglycemia, unspecified: Secondary | ICD-10-CM | POA: Diagnosis not present

## 2021-06-25 DIAGNOSIS — Z7982 Long term (current) use of aspirin: Secondary | ICD-10-CM

## 2021-06-25 DIAGNOSIS — R0789 Other chest pain: Secondary | ICD-10-CM

## 2021-06-25 DIAGNOSIS — E878 Other disorders of electrolyte and fluid balance, not elsewhere classified: Secondary | ICD-10-CM | POA: Diagnosis not present

## 2021-06-25 DIAGNOSIS — Z931 Gastrostomy status: Secondary | ICD-10-CM

## 2021-06-25 DIAGNOSIS — L8990 Pressure ulcer of unspecified site, unspecified stage: Secondary | ICD-10-CM | POA: Insufficient documentation

## 2021-06-25 DIAGNOSIS — E876 Hypokalemia: Secondary | ICD-10-CM | POA: Diagnosis not present

## 2021-06-25 DIAGNOSIS — Z7952 Long term (current) use of systemic steroids: Secondary | ICD-10-CM

## 2021-06-25 DIAGNOSIS — R1310 Dysphagia, unspecified: Secondary | ICD-10-CM | POA: Diagnosis present

## 2021-06-25 DIAGNOSIS — Z7401 Bed confinement status: Secondary | ICD-10-CM

## 2021-06-25 HISTORY — DX: Gastrostomy status: Z93.1

## 2021-06-25 HISTORY — DX: Anemia, unspecified: D64.9

## 2021-06-25 HISTORY — DX: Gastrointestinal hemorrhage, unspecified: K92.2

## 2021-06-25 HISTORY — DX: Long term (current) use of anticoagulants: Z79.01

## 2021-06-25 HISTORY — DX: Dysphagia following cerebral infarction: I69.391

## 2021-06-25 HISTORY — DX: Atherosclerotic heart disease of native coronary artery without angina pectoris: I25.10

## 2021-06-25 HISTORY — DX: COVID-19: U07.1

## 2021-06-25 HISTORY — DX: Unspecified visual loss: H54.7

## 2021-06-25 LAB — URINALYSIS, ROUTINE W REFLEX MICROSCOPIC
Bilirubin Urine: NEGATIVE
Glucose, UA: NEGATIVE mg/dL
Hgb urine dipstick: NEGATIVE
Ketones, ur: NEGATIVE mg/dL
Leukocytes,Ua: NEGATIVE
Nitrite: NEGATIVE
Protein, ur: 100 mg/dL — AB
Specific Gravity, Urine: 1.014 (ref 1.005–1.030)
pH: 7 (ref 5.0–8.0)

## 2021-06-25 LAB — CBC WITH DIFFERENTIAL/PLATELET
Abs Immature Granulocytes: 0 10*3/uL (ref 0.00–0.07)
Abs Immature Granulocytes: 0 10*3/uL (ref 0.00–0.07)
Abs Immature Granulocytes: 0 10*3/uL (ref 0.00–0.07)
Basophils Absolute: 0 10*3/uL (ref 0.0–0.1)
Basophils Absolute: 0 10*3/uL (ref 0.0–0.1)
Basophils Absolute: 0 10*3/uL (ref 0.0–0.1)
Basophils Relative: 0 %
Basophils Relative: 0 %
Basophils Relative: 0 %
Eosinophils Absolute: 0 10*3/uL (ref 0.0–0.5)
Eosinophils Absolute: 0 10*3/uL (ref 0.0–0.5)
Eosinophils Absolute: 0 10*3/uL (ref 0.0–0.5)
Eosinophils Relative: 0 %
Eosinophils Relative: 0 %
Eosinophils Relative: 0 %
HCT: 30.7 % — ABNORMAL LOW (ref 36.0–46.0)
HCT: 25 % — ABNORMAL LOW (ref 36.0–46.0)
HCT: 28.3 % — ABNORMAL LOW (ref 36.0–46.0)
Hemoglobin: 9.6 g/dL — ABNORMAL LOW (ref 12.0–15.0)
Hemoglobin: 6.8 g/dL — CL (ref 12.0–15.0)
Hemoglobin: 9.3 g/dL — ABNORMAL LOW (ref 12.0–15.0)
Lymphocytes Relative: 2 %
Lymphocytes Relative: 4 %
Lymphocytes Relative: 8 %
Lymphs Abs: 0.4 10*3/uL — ABNORMAL LOW (ref 0.7–4.0)
Lymphs Abs: 0.8 10*3/uL (ref 0.7–4.0)
Lymphs Abs: 1.7 10*3/uL (ref 0.7–4.0)
MCH: 27.4 pg (ref 26.0–34.0)
MCH: 26.6 pg (ref 26.0–34.0)
MCH: 28.4 pg (ref 26.0–34.0)
MCHC: 31.3 g/dL (ref 30.0–36.0)
MCHC: 27.2 g/dL — ABNORMAL LOW (ref 30.0–36.0)
MCHC: 32.9 g/dL (ref 30.0–36.0)
MCV: 87.7 fL (ref 80.0–100.0)
MCV: 97.7 fL (ref 80.0–100.0)
MCV: 86.5 fL (ref 80.0–100.0)
Monocytes Absolute: 0.8 10*3/uL (ref 0.1–1.0)
Monocytes Absolute: 1.2 10*3/uL — ABNORMAL HIGH (ref 0.1–1.0)
Monocytes Absolute: 0.8 10*3/uL (ref 0.1–1.0)
Monocytes Relative: 4 %
Monocytes Relative: 6 %
Monocytes Relative: 4 %
Neutro Abs: 19.5 10*3/uL — ABNORMAL HIGH (ref 1.7–7.7)
Neutro Abs: 17.8 10*3/uL — ABNORMAL HIGH (ref 1.7–7.7)
Neutro Abs: 18.6 10*3/uL — ABNORMAL HIGH (ref 1.7–7.7)
Neutrophils Relative %: 94 %
Neutrophils Relative %: 90 %
Neutrophils Relative %: 88 %
Platelets: 161 10*3/uL (ref 150–400)
Platelets: 240 10*3/uL (ref 150–400)
Platelets: 160 10*3/uL (ref 150–400)
RBC: 3.5 MIL/uL — ABNORMAL LOW (ref 3.87–5.11)
RBC: 2.56 MIL/uL — ABNORMAL LOW (ref 3.87–5.11)
RBC: 3.27 MIL/uL — ABNORMAL LOW (ref 3.87–5.11)
RDW: 15.3 % (ref 11.5–15.5)
RDW: 16 % — ABNORMAL HIGH (ref 11.5–15.5)
RDW: 15.5 % (ref 11.5–15.5)
WBC Morphology: INCREASED
WBC: 20.7 10*3/uL — ABNORMAL HIGH (ref 4.0–10.5)
WBC: 19.8 10*3/uL — ABNORMAL HIGH (ref 4.0–10.5)
WBC: 21.1 10*3/uL — ABNORMAL HIGH (ref 4.0–10.5)
nRBC: 0.3 % — ABNORMAL HIGH (ref 0.0–0.2)
nRBC: 1 /100 WBC — ABNORMAL HIGH
nRBC: 0 /100 WBC
nRBC: 0.6 % — ABNORMAL HIGH (ref 0.0–0.2)
nRBC: 0.2 % (ref 0.0–0.2)
nRBC: 1 /100 WBC — ABNORMAL HIGH

## 2021-06-25 LAB — I-STAT CHEM 8, ED
BUN: 110 mg/dL — ABNORMAL HIGH (ref 8–23)
Calcium, Ion: 1.03 mmol/L — ABNORMAL LOW (ref 1.15–1.40)
Chloride: 128 mmol/L — ABNORMAL HIGH (ref 98–111)
Creatinine, Ser: 1.9 mg/dL — ABNORMAL HIGH (ref 0.44–1.00)
Glucose, Bld: 310 mg/dL — ABNORMAL HIGH (ref 70–99)
HCT: 23 % — ABNORMAL LOW (ref 36.0–46.0)
Hemoglobin: 7.8 g/dL — ABNORMAL LOW (ref 12.0–15.0)
Potassium: 5.5 mmol/L — ABNORMAL HIGH (ref 3.5–5.1)
Sodium: 155 mmol/L — ABNORMAL HIGH (ref 135–145)
TCO2: 17 mmol/L — ABNORMAL LOW (ref 22–32)

## 2021-06-25 LAB — POC OCCULT BLOOD, ED: Fecal Occult Bld: POSITIVE — AB

## 2021-06-25 LAB — COMPREHENSIVE METABOLIC PANEL
ALT: 17 U/L (ref 0–44)
AST: 26 U/L (ref 15–41)
Albumin: 2.2 g/dL — ABNORMAL LOW (ref 3.5–5.0)
Alkaline Phosphatase: 47 U/L (ref 38–126)
Anion gap: 9 (ref 5–15)
BUN: 84 mg/dL — ABNORMAL HIGH (ref 8–23)
CO2: 16 mmol/L — ABNORMAL LOW (ref 22–32)
Calcium: 7.5 mg/dL — ABNORMAL LOW (ref 8.9–10.3)
Chloride: 129 mmol/L — ABNORMAL HIGH (ref 98–111)
Creatinine, Ser: 1.79 mg/dL — ABNORMAL HIGH (ref 0.44–1.00)
GFR, Estimated: 32 mL/min — ABNORMAL LOW (ref >60)
Glucose, Bld: 278 mg/dL — ABNORMAL HIGH (ref 70–99)
Potassium: 4.4 mmol/L (ref 3.5–5.1)
Sodium: 154 mmol/L — ABNORMAL HIGH (ref 135–145)
Total Bilirubin: 0.4 mg/dL (ref 0.3–1.2)
Total Protein: 5.2 g/dL — ABNORMAL LOW (ref 6.5–8.1)

## 2021-06-25 LAB — LACTIC ACID, PLASMA
Lactic Acid, Venous: 5.9 mmol/L (ref 0.5–1.9)
Lactic Acid, Venous: 2.7 mmol/L (ref 0.5–1.9)
Lactic Acid, Venous: 4.6 mmol/L (ref 0.5–1.9)

## 2021-06-25 LAB — TROPONIN I (HIGH SENSITIVITY): Troponin I (High Sensitivity): 248 ng/L (ref <18)

## 2021-06-25 LAB — PROTIME-INR
INR: 2 — ABNORMAL HIGH (ref 0.8–1.2)
Prothrombin Time: 22.2 seconds — ABNORMAL HIGH (ref 11.4–15.2)

## 2021-06-25 LAB — GLUCOSE, CAPILLARY
Glucose-Capillary: 176 mg/dL — ABNORMAL HIGH (ref 70–99)
Glucose-Capillary: 150 mg/dL — ABNORMAL HIGH (ref 70–99)
Glucose-Capillary: 106 mg/dL — ABNORMAL HIGH (ref 70–99)
Glucose-Capillary: 109 mg/dL — ABNORMAL HIGH (ref 70–99)

## 2021-06-25 LAB — PREPARE RBC (CROSSMATCH)

## 2021-06-25 LAB — RESP PANEL BY RT-PCR (FLU A&B, COVID) ARPGX2
Influenza A by PCR: NEGATIVE
Influenza B by PCR: NEGATIVE
SARS Coronavirus 2 by RT PCR: NEGATIVE

## 2021-06-25 LAB — APTT: aPTT: 31 seconds (ref 24–36)

## 2021-06-25 LAB — PROCALCITONIN: Procalcitonin: 22.01 ng/mL

## 2021-06-25 IMAGING — DX DG CHEST 1V PORT
1 series · 1 of 1 positions shown · non-contrast
Comparison: Portable chest [DATE].

CLINICAL DATA: Shortness of breath and hypotension.

EXAM:
PORTABLE CHEST 1 VIEW

[chest ap]
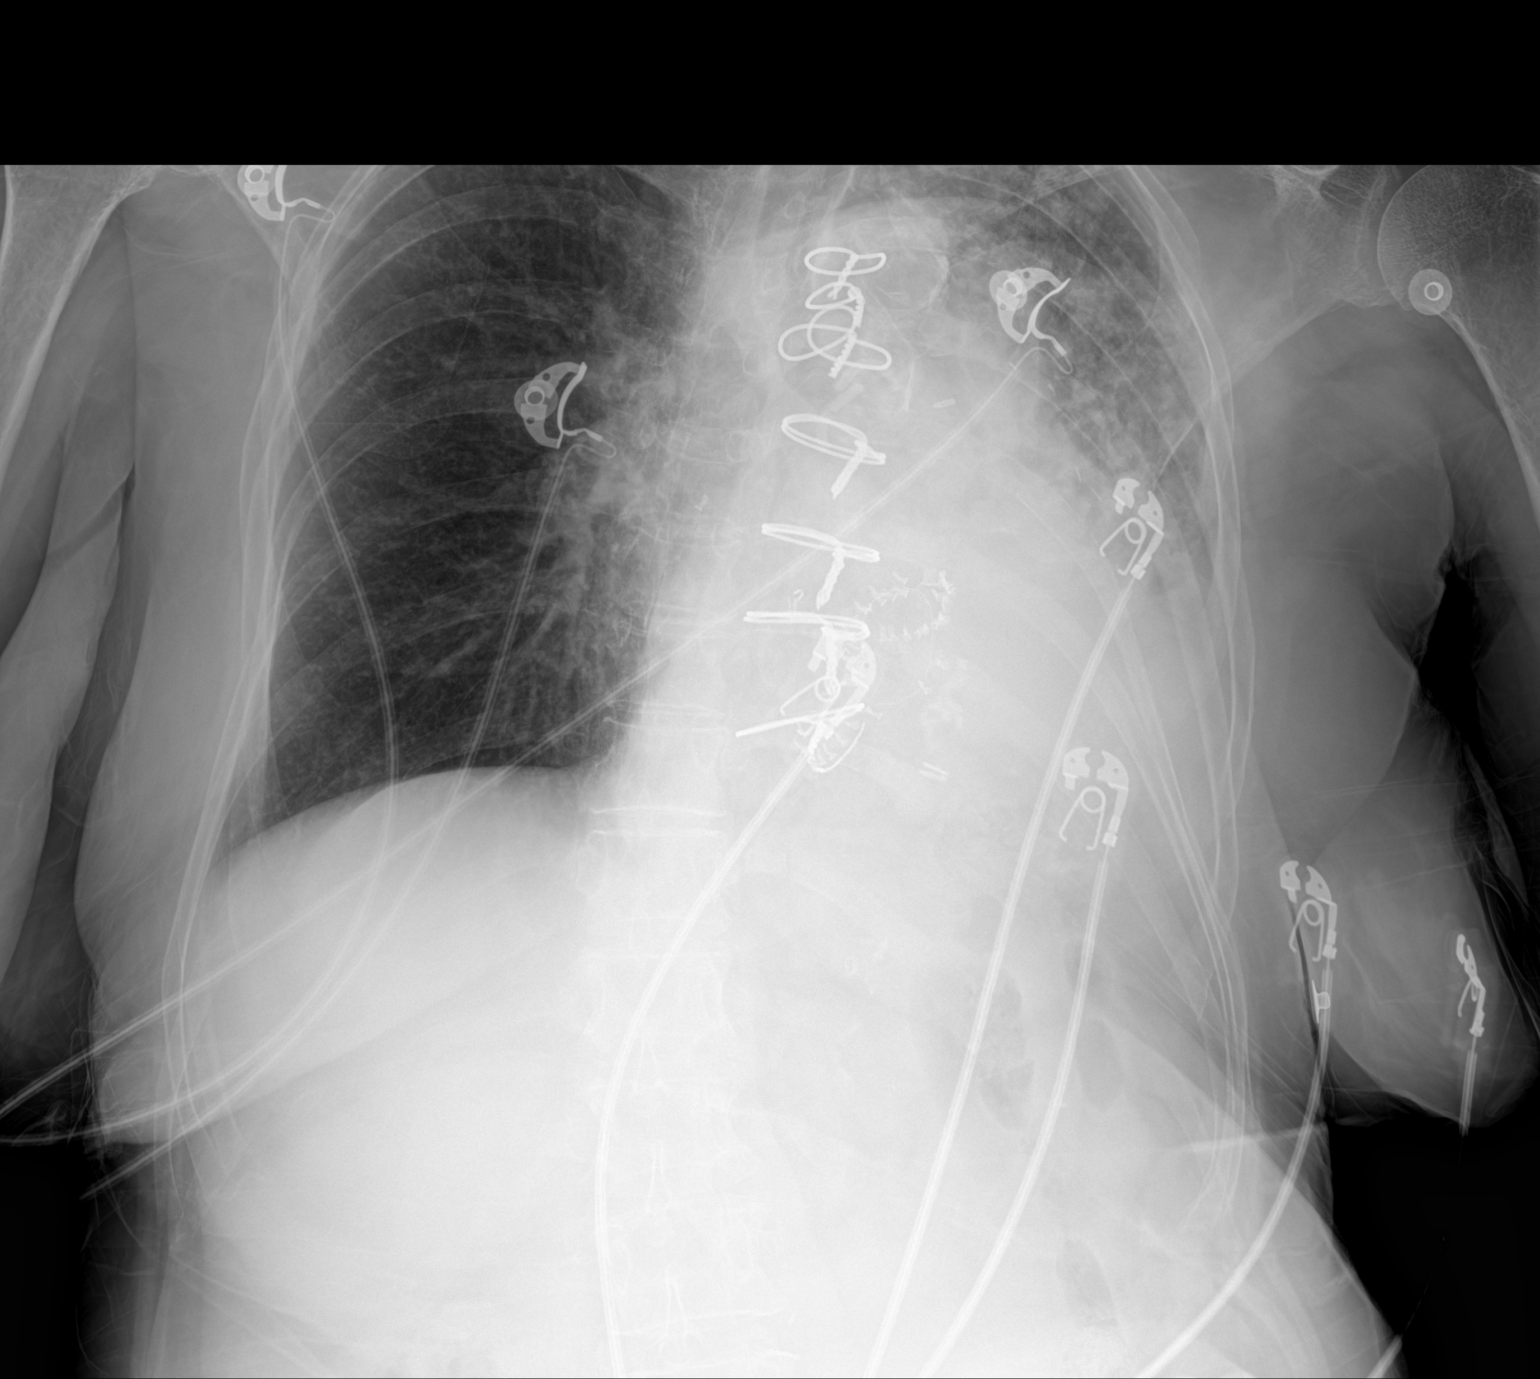

[1 of 1 positions shown; findings below may reference images not displayed]

FINDINGS: Again noted are sternotomy sutures with aortic valve replacement.

There is mild central vascular prominence but no findings of edema.
Mild cardiomegaly.

There is dense opacification of lower half of the left chest, and in
the upper half of the left lung there is patchy airspace disease.

There is a least a small or moderate-sized underlying left pleural
effusion.

Findings are worrisome for pneumonia, aspiration, and reactive
pleural effusion.

Stable mediastinum. There is calcification in the aortic arch. Right
lung is clear. Osteopenia.
IMPRESSION: On the left, since [DATE] there is new opacification in the
lower half of the thorax with patchy airspace disease in the left
upper lobe. Findings may suggest pneumonia or aspiration with
reactive pleural effusion which could be small to moderate in size.

Clinical correlation and radiographic follow-up are recommended.
Right lung is clear. Mild cardiomegaly.

## 2021-06-25 MED ORDER — FREE WATER
100.0000 mL | Freq: Four times a day (QID) | Status: DC
  Administered 2021-06-25 – 2021-06-26 (×4): 100 mL

## 2021-06-25 MED ORDER — ACETAMINOPHEN 325 MG PO TABS
650.0000 mg | ORAL_TABLET | Freq: Four times a day (QID) | ORAL | Status: DC | PRN
  Administered 2021-06-25 – 2021-06-28 (×3): 650 mg via ORAL
  Filled 2021-06-25 (×3): qty 2

## 2021-06-25 MED ORDER — ACETAMINOPHEN 650 MG RE SUPP: 650.0000 mg | Freq: Four times a day (QID) | RECTAL | Status: DC | PRN

## 2021-06-25 MED ORDER — ACETAMINOPHEN 650 MG RE SUPP
650.0000 mg | Freq: Once | RECTAL | Status: AC
  Administered 2021-06-25: 650 mg via RECTAL

## 2021-06-25 MED ORDER — SODIUM CHLORIDE 0.9 % IV SOLN: 10.0000 mL/h | Freq: Once | INTRAVENOUS | Status: DC

## 2021-06-25 MED ORDER — SODIUM CHLORIDE 0.9 % IV SOLN
2.0000 g | Freq: Once | INTRAVENOUS | Status: AC
  Administered 2021-06-25: 2 g via INTRAVENOUS
  Filled 2021-06-25: qty 2

## 2021-06-25 MED ORDER — METRONIDAZOLE 500 MG/100ML IV SOLN
500.0000 mg | Freq: Once | INTRAVENOUS | Status: AC
  Administered 2021-06-25: 500 mg via INTRAVENOUS
  Filled 2021-06-25: qty 100

## 2021-06-25 MED ORDER — CHLORHEXIDINE GLUCONATE CLOTH 2 % EX PADS
6.0000 | MEDICATED_PAD | Freq: Every day | CUTANEOUS | Status: DC
  Administered 2021-06-25 – 2021-07-03 (×10): 6 via TOPICAL

## 2021-06-25 MED ORDER — ONDANSETRON HCL 4 MG/2ML IJ SOLN: 4.0000 mg | Freq: Four times a day (QID) | INTRAMUSCULAR | Status: DC | PRN

## 2021-06-25 MED ORDER — ONDANSETRON HCL 4 MG PO TABS: 4.0000 mg | ORAL_TABLET | Freq: Four times a day (QID) | ORAL | Status: DC | PRN

## 2021-06-25 MED ORDER — VANCOMYCIN VARIABLE DOSE PER UNSTABLE RENAL FUNCTION (PHARMACIST DOSING): Status: DC

## 2021-06-25 MED ORDER — VANCOMYCIN HCL 500 MG/100ML IV SOLN: 500.0000 mg | INTRAVENOUS | Status: DC

## 2021-06-25 MED ORDER — INSULIN ASPART 100 UNIT/ML IJ SOLN
0.0000 [IU] | INTRAMUSCULAR | Status: DC
  Administered 2021-06-25: 1 [IU] via SUBCUTANEOUS
  Administered 2021-06-25 – 2021-06-28 (×2): 2 [IU] via SUBCUTANEOUS
  Administered 2021-06-28 – 2021-07-03 (×12): 1 [IU] via SUBCUTANEOUS

## 2021-06-25 MED ORDER — PANTOPRAZOLE SODIUM 40 MG IV SOLR: 40.0000 mg | Freq: Every day | INTRAVENOUS | Status: DC

## 2021-06-25 MED ORDER — LACTATED RINGERS IV SOLN: INTRAVENOUS | Status: AC

## 2021-06-25 MED ORDER — SODIUM CHLORIDE 0.9 % IV SOLN
2.0000 g | INTRAVENOUS | Status: DC
  Administered 2021-06-25: 2 g via INTRAVENOUS
  Filled 2021-06-25: qty 2

## 2021-06-25 MED ORDER — LACTATED RINGERS IV BOLUS (SEPSIS)
1000.0000 mL | Freq: Once | INTRAVENOUS | Status: AC
  Administered 2021-06-25: 1000 mL via INTRAVENOUS

## 2021-06-25 MED ORDER — PANTOPRAZOLE SODIUM 40 MG IV SOLR
40.0000 mg | Freq: Two times a day (BID) | INTRAVENOUS | Status: DC
  Administered 2021-06-25 – 2021-06-27 (×5): 40 mg via INTRAVENOUS
  Filled 2021-06-25 (×5): qty 40

## 2021-06-25 MED ORDER — APIXABAN 5 MG PO TABS
5.0000 mg | ORAL_TABLET | Freq: Two times a day (BID) | ORAL | Status: DC
Start: 2021-06-25 — End: 2021-06-25

## 2021-06-25 MED ORDER — SODIUM CHLORIDE 0.9% FLUSH
3.0000 mL | Freq: Two times a day (BID) | INTRAVENOUS | Status: DC
  Administered 2021-06-25: 10 mL via INTRAVENOUS
  Administered 2021-06-26 – 2021-07-03 (×14): 3 mL via INTRAVENOUS

## 2021-06-25 MED ORDER — VANCOMYCIN HCL IN DEXTROSE 1-5 GM/200ML-% IV SOLN
1000.0000 mg | Freq: Once | INTRAVENOUS | Status: AC
  Administered 2021-06-25: 1000 mg via INTRAVENOUS
  Filled 2021-06-25: qty 200

## 2021-06-25 MED ORDER — SODIUM CHLORIDE 0.9 % IV BOLUS: 1000.0000 mL | Freq: Once | INTRAVENOUS | Status: DC

## 2021-06-25 MED ORDER — APIXABAN 2.5 MG PO TABS
2.5000 mg | ORAL_TABLET | Freq: Two times a day (BID) | ORAL | Status: DC
Start: 2021-06-25 — End: 2021-06-25

## 2021-06-25 MED ORDER — LACTATED RINGERS IV BOLUS
1000.0000 mL | Freq: Once | INTRAVENOUS | Status: AC
Start: 2021-06-25 — End: 2021-06-25
  Administered 2021-06-25: 1000 mL via INTRAVENOUS

## 2021-06-25 NOTE — Assessment & Plan Note (Addendum)
-  s/p CABG. No chest pain, stable

## 2021-06-25 NOTE — Assessment & Plan Note (Addendum)
-  Unclear etiology, status post 2 unit PRBC at intake. History of GI bleed continues on anticoagulation but no signs or symptoms of blood loss at this time. Hemoglobin stable

## 2021-06-25 NOTE — Assessment & Plan Note (Addendum)
-  Cr normalized with fluids

## 2021-06-25 NOTE — Assessment & Plan Note (Deleted)
-  SIRS criteria in this patient includes: Leukocytosis, fever, tachypnea, hypoxia  -Patient has evidence of acute organ failure with elevated lactate >2; encephalopathy; recurrent hypotension (SBP < 90 or MAP < 65 x 2 readings) that is not easily explained by another condition. -While awaiting blood cultures, this appears to be a preseptic condition. -Sepsis protocol initiated -Worse outcomes are predicted from sepsis with 2 of the following: RR > 22; AMS , GCS < 13; or SBP <100.  This patient meets all of these criteria. -Patient had initial lactate >4 and SBP <90/MAP <65 and so has received the 30 cc/kg IVF bolus. -She has criteria for shock and this is marginally improved since arrival; she remains with tenuous BP, markedly increased respiratory effort with RR 40s, and need for 45L HFNC; as such, PCCM re-consultation has been requested. -Suspected source is L-sided PNA, likely related to aspiration PNA. -Blood and urine cultures pending -Will admit due to: hemodynamic instability; currently requested progressive bed but PCCM asked to consider ICU admission given family desire for full code -Treat with IV Cefepime/Vanc -Will trend lactate to ensure improvement -Will order lower respiratory tract procalcitonin level.

## 2021-06-25 NOTE — Procedures (Signed)
Patient Name: Jill Buchanan  MRN: [id number]  Epilepsy Attending: Lora Havens  Referring Physician/Provider: Karmen Bongo, MD Date: 06/25/2021 Duration: 21.53 mins  Patient history: 61 year old female with prior strokes who presented with altered mental status and tongue trauma.  EEG to evaluate for seizures.  Level of alertness: Awake  AEDs during EEG study: None  Technical aspects: This EEG study was done with scalp electrodes positioned according to the 10-20 International system of electrode placement. Electrical activity was acquired at a sampling rate of 500Hz  and reviewed with a high frequency filter of 70Hz  and a low frequency filter of 1Hz . EEG data were recorded continuously and digitally stored.   Description: No clear posterior dominant rhythm was seen. EEG showed continuous generalized 3 to 5 Hz theta-delta slowing. Generalized periodic discharges with triphasic morphology were also noted at fluctuating frequency of 1 to 2.5 Hz.  Hyperventilation and photic stimulation were not performed.     ABNORMALITY - Periodic discharges with triphasic morphology, generalized ( GPDs) - Continuous slow, generalized  IMPRESSION: This study showed generalized periodic discharges with triphasic morphology which is on the ictal-interictal continuum with low to intermediate potential for seizures. Given the morphology and frequency of discharges, this EEG pattern can also be seen due to toxic-metabolic causes like cefepime toxicity, hyperammonemia, hyperuremia.   Additionally, EEG suggestive of moderate diffuse encephalopathy, nonspecific etiology.  No seizures were seen throughout the recording.  Annelie Boak Barbra Sarks

## 2021-06-25 NOTE — Assessment & Plan Note (Addendum)
-  Has PEG tube and receives tube feeds. However, according to her daughter, she has also been taking some pureed liquids and speech may have given thin liquids this week; she was planned for a swallow evaluation soon

## 2021-06-25 NOTE — ED Notes (Signed)
Gave pt directly to MD Pollina due to meter not accepting Occult results

## 2021-06-25 NOTE — Progress Notes (Signed)
EEG done at bedside. No skin breakdown seen. Results pending.

## 2021-06-25 NOTE — Assessment & Plan Note (Addendum)
-  no CPR but do intubate

## 2021-06-25 NOTE — ED Triage Notes (Signed)
Pt in from [facility] via [place] with sob, hypotension. Facility states they noticed "breathing problems" when they rounded on her this morning. Pt unresponsive, sats 70% on RA when EMS arrived. Arrives to with NRB - sats mid-80%'s. GCS 5

## 2021-06-25 NOTE — Progress Notes (Addendum)
NAME:  Jill Buchanan, MRN:  [id number], DOB:  [birth year], LOS: 0 ADMISSION DATE:  06/25/2021, CONSULTATION DATE: June 25, 2021 REFERRING MD: ED physician, CHIEF COMPLAINT: Hypoxia tachypnea and altered mental status  History of Present Illness:  61 year old F, SNF resident, who presented to [facility] ER with reports of poor intake and low oxygen saturations.   The patient lives in a SNF due to severe debility in the setting of multiple strokes.  She also has a hx of anemia, GIB, AF on anticoagulation.  In December 2022, she was admitted to the hospital and seen by Palliative Care and was a DNR (on current admission this was confirmed by nursing home paperwork and sister by phone).   At the SNF, she was noted to have poor intake, concern for dehydration, tachypnea and low oxygen saturations.  She was suctioned several times and placed on oxygen.  There were concerns she had not taken in fluids in several days.  Initially staff were unable to reach daughter [facility]) but sister confirmed she was DNR on phone per staff.  Initial work up notable for Na 155, K 5.5, Cl 128, glucose 310, BUN 110, Cr 1.90, WBC 19.8, Hgb 6.8 and platelets 240.  She was treated with IVF, 2 units PRBC. She was febrile with CXR concerning for LUL aspiration PNA. Initial lactic acid 5.9 which cleared to 2.7 after fluid resuscitation.  Foley catheter was placed in the ER.   PCCM called for evaluation.    Daughter reports pt was standing at SNF with 1 assist.  Had been tolerating spoonfuls of water / thickened liquids.  She had difficulties with pocketing food per daughter and diet was changed.  There were concerns for aspiration at the facility.  She has been TF dependent.   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  Femoral central line placed in ER.     Subjective:  RN  reports pt on unit #2 of PRBC  BP improved  Tmax 101.2   Objective   Blood pressure 93/76, pulse 90, temperature (!) 101.2 F (38.4 C), resp. rate (!) 33, weight 51.1 kg, SpO2 95 %.    FiO2 (%):  [100 %] 100 %   Intake/Output Summary (Last 24 hours) at 06/25/2021 1023 Last data filed at 06/25/2021 [id number] Gross per 24 hour  Intake 3200 ml  Output --  Net 3200 ml   Filed Weights   06/25/21 0336  Weight: 51.1 kg    Examination: General: chronically ill, debilitated adult female lying in bed  HEENT: MM pink/very dry with cracked lips / tongue, opaque globes / sunken eyes Neuro: eyes closed, moves head to sound of voice, does not follow commands CV: s1s2 RRR, no m/r/g PULM: tachypnea but not labored, lungs bilaterally with coarse rhonchi, upper airway gurgling GI: soft, bsx4 hypoactive, PEG in place  Extremities: warm/dry, no edema  Skin: no rashes or lesions.  Femoral central line c/d/i  Assessment & Plan:   Severe Sepsis  Hypovolemia Profound volume depletion with anemia. New LUL infiltrate. S/p IVF + 2 units PRBC in ER.  -continue LR at 170ml/hr  -admit to ICU for observation overnight   -assess PCT -follow lactic acid  -assess blood cultures, urine culture  -empiric abx as below   LUL PNA  Acute Hypoxic Respiratory Failure  -cefepime /  vancomycin per pharmacy  -wean O2 for sats >90% -at risk for intubation / failure > daughter wants intubated only if family can not make it to say goodbye to the patient.  She would want a call before intubation to try to avoid if possible.  -NTS PRN  -aspiration precautions, may need SLP once more stable  -NPO  Anemia  Query acute on chronic blood loss. No clear source of bleeding, hx of prior GIB, suspect low volume chronic GI loss superimposed on chronic disease.  -trend CBC -transfuse for Hgb <7% -follow up CBC post transfusion  -assess FOBT  -not currently a candidate for GI evaluation   Hyponatremia, Hypochloremia  AKI   In setting of profound volume depletion, anemia  -Trend BMP / urinary output -free water PT  -Replace electrolytes as indicated -Avoid nephrotoxic agents, ensure adequate renal perfusion  Hyperglycemia  -SSI, sensitive scale  -no basal insulin at this time   AF on Chronic Anticoagulation HTN, HLD  CAD s/p CABG  -tele monitoring  -hold home anticoagulation given anemia / transfusions -hold home norvasc, coreg, lipitor   Hx CVA, Acute on Chronic Encephalopathy  -EEG pending  -CT head ordered per [facility]  Congenital Blindness  -supportive care   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  Daughter indicates she would want to have the opportunity to get to the patient to say goodbye.  She shares that they are native [place] and only say good bye when person is dying.  She would only want short term ventilator support.  She states "I don't want the other machine that keeps you alive and I am ok with making the decision to stop care / take her off the ventilator". She states she is open to the idea of hospice when the patient transitions but does not feel she is there now.  She is ok with no CPR and continuing current medical care. Will need further discussions with family.    Critical Care Time: 20 minutes        [name], MSN, APRN, NP-C, AGACNP-BC [facility] 06/25/2021, 10:39 AM   Please see [email or web address] for pager details.   From 7A-7P if no response, please call [phone] After hours, please call Warren Lacy [phone]    I agree with the Advanced Practitioner's note, impression, and recommendations as outlined. I have taken an independent interval history, reviewed the chart and examined the patient.  My medical decision making is as follows:   Subjective: This is a 61 yo woman who is severely bed bound and debilitated who presents with deyhdration, failure to thrive, hypoxemia from  nursing home.   Objective: Vitals:   06/25/21 1300 06/25/21 1315  BP: 97/68 (!) 103/51  Pulse: 80 85  Resp: (!) 32 (!) 28  Temp:    SpO2: 100% 100%    Chronically ill appearing Not responsive Dry mucus membranes, sunken eye sockets, cracked lips Breathing is labored, breath sounds diminished bilaterally G tube Moves all 4 extremities. Withdraws to pain  Labs/Imaging: Lactic acid elevated Na 155 K 5.5 Cr 1.9 Cl 128 Hgb 6.8 on admission Glucose elevated  Chest xray reviewed - left sided opacity with shift to the left of the mediastinum. Suspect mucus plug  Assessment and Plan:  Acute hypoxemic respiratory failure Severe sepsis secondary to Aspiration pneumonia and mucus plugging Dehydration Failure to thrive Acute Kidney Injury Hypernatremia Hyperkalemia Hyperglycemia Acute blood loss anemia Atrial Fibrillation on anticoagulation  Acute metabolic encephalopathy with multiple strokes   She is severely debilitated  IV abx empirically cefepime and vancomycin for now. Follow culture data Lactated ringers at 125 with repeat BMET to correct multiple electrolyte abnormalities.  Follow CT Head  Plan of care was discussed with the patient's daughter. The patient expressed being a DNR previously. Family wants to give time for other relatives to arrive ok with time limited trial of intubation.  Patient is actively dying. Will support as above.   The patient is critically ill due to encephalopathy, respiratory failure, failure to thrive with multiple organ systems failure and requires high complexity decision making for assessment and support, frequent evaluation and titration of therapies, application of advanced monitoring technologies and extensive interpretation of multiple databases.   Critical Care Time devoted to patient care services described in this note is 35 minutes. This time reflects time of care of this [place] . This critical care time does not  reflect separately billable procedures or procedure time, teaching time and supervisory time of PA/NP/Med student/Med Resident etc but could involve care discussion time.  Spero Geralds [facility] 06/25/2021 2:49 PM  Pager: see AMION  If no response to pager, please call critical care on call (see AMION) until 7pm After 7:00 pm call Elink

## 2021-06-25 NOTE — ED Notes (Signed)
Report called to [facility] RN

## 2021-06-25 NOTE — Sepsis Progress Note (Signed)
Elink following Code Sepsis. 

## 2021-06-25 NOTE — Consult Note (Signed)
NAME:  Jill Buchanan, MRN:  [id number], DOB:  05-Sep-1960, LOS: 0 ADMISSION DATE:  06/25/2021, CONSULTATION DATE: June 25, 2021 REFERRING MD: ED physician, CHIEF COMPLAINT: Hypoxia tachypnea and altered mental status  History of Present Illness:  Patient is 61 year old Caucasian female with past medical history of multiple strokes anemia GI bleed A. fib anticoagulation was at the nursing home with multiple strokes and disabilities she was admitted in December 2022 with complex medical issues had palliative care and primary care team had discussions with her and the family and she was DNR that was confirmed by her sister on the phone and paperwork that she has with her from the nursing home. Was not able to get any history from the patient as she is very altered tachypneic but reviewing medical records and discussions between different teams in the past patient had stroke at the nursing home seem to have very poor appetite she was found to have hypoxia and tachypnea EMS was called she was satting in the 70s was brought here was put on high flow multiple suction as NTS with severe dehydration Foley was placed as well patient does not seem to have any water in the past several days with poor p.o. intake DNR was reviewed by the nursing home paperwork and the ED staff tried to call her daughter but they were not able to reach her so they called her sister who confirmed that she is DNR but the sister is not POA so she is Trying to reach the daughter.  Seeing the patient patient seem to be very dehydrated tachypneic and gurgling in her throat that cleared after suctioning. Objective   Blood pressure 108/61, pulse 98, temperature (!) 100.6 F (38.1 C), resp. rate (!) 32, weight 51.1 kg, SpO2 92 %.    FiO2 (%):  [100 %] 100 %   Intake/Output Summary (Last 24 hours) at 06/25/2021 [id number] Last data filed at 06/25/2021 0600 Gross per 24 hour  Intake 2200 ml  Output --  Net 2200 ml   Filed Weights    06/25/21 0336  Weight: 51.1 kg    Examination: General: In moderate to severe distress from shortness of breath Neuro: Very altered able to protect airways moving upper lower extremities with a very limited range HEENT:  atraumatic , no jaundice , dry mucous membranes extremely dry mucous membranes sunken eyes Cardiovascular:  Irregular irregular , ESM 2/6 in the aortic area tachycardia Lungs: Severe bilateral inspiratory crackles no wheezing Abdomen:  Soft lax +BS , no tenderness . Musculoskeletal:  WNL , normal pulses  Skin:  No rash      Assessment & Plan:    -- Severe altered mental status dehydration hypovolemic shock aspiration and multiple previous strokes at this point the patient is being managed in the ED no clear indication for admission to the ICU. Patient is being resuscitated with IV fluids for severe dehydration hyponatremia and hypochloremia she is also ordered 2 units of blood continue discussions with the family about palliative comfort care as the patient is a DNR and documented by nursing home papers last admission and family for family is trying to reach POA   --Patient needs frequent suctioning but she is satting 94% on OptiFlow heated high flow.  At this point continue discussions with the family about goals of care call us with any question thank you for this consultation.     Labs   CBC: Recent Labs  Lab 06/25/21 0405 06/25/21 0413  WBC 19.8*  --  NEUTROABS 17.8*  --   HGB 6.8* 7.8*  HCT 25.0* 23.0*  MCV 97.7  --   PLT 240  --     Basic Metabolic Panel: Recent Labs  Lab 06/25/21 0405 06/25/21 0413  NA 154* 155*  K 4.4 5.5*  CL 129* 128*  CO2 16*  --   GLUCOSE 278* 310*  BUN 84* 110*  CREATININE 1.79* 1.90*  CALCIUM 7.5*  --    GFR: Estimated Creatinine Clearance: 24.8 mL/min (A) (by C-G formula based on SCr of 1.9 mg/dL (H)). Recent Labs  Lab 06/25/21 0405  WBC 19.8*  LATICACIDVEN 5.9*    Liver Function Tests: Recent Labs   Lab 06/25/21 0405  AST 26  ALT 17  ALKPHOS 47  BILITOT 0.4  PROT 5.2*  ALBUMIN 2.2*   No results for input(s): LIPASE, AMYLASE in the last 168 hours. No results for input(s): AMMONIA in the last 168 hours.  ABG    Component Value Date/Time   TCO2 17 (L) 06/25/2021 0413     Coagulation Profile: Recent Labs  Lab 06/25/21 0405  INR 2.0*    Cardiac Enzymes: No results for input(s): CKTOTAL, CKMB, CKMBINDEX, TROPONINI in the last 168 hours.  HbA1C: Hgb A1c MFr Bld  Date/Time Value Ref Range Status  04/29/2021 02:44 AM 4.9 4.8 - 5.6 % Final    Comment:    (NOTE) Pre diabetes:          5.7%-6.4%  Diabetes:              >6.4%  Glycemic control for   <7.0% adults with diabetes     CBG: No results for input(s): GLUCAP in the last 168 hours.  Review of Systems:   Unable to obtain due to patient condition  Past Medical History:  She,  has a past medical history of A-fib ([place]) and CVA (cerebral vascular accident) ([place]).   Surgical History:  No past surgical history on file.   Social History:      Family History:  Her family history is not on file.   Allergies Allergies  Allergen Reactions   Eggs Or Egg-Derived Products      Home Medications  Prior to Admission medications   Medication Sig Start Date End Date Taking? Authorizing Provider  acetaminophen (TYLENOL) 500 MG tablet Place 1 tablet (500 mg total) into feeding tube every 4 (four) hours as needed for mild pain. 05/02/21   Gifford Shave, MD  amLODipine (NORVASC) 10 MG tablet Place 1 tablet (10 mg total) into feeding tube daily. 05/02/21   Gifford Shave, MD  apixaban (ELIQUIS) 5 MG TABS tablet Place 1 tablet (5 mg total) into feeding tube 2 (two) times daily. 05/02/21   Gifford Shave, MD  aspirin 81 MG chewable tablet Place 1 tablet (81 mg total) into feeding tube daily. 05/03/21   Gifford Shave, MD  atorvastatin (LIPITOR) 80 MG tablet Place 1 tablet (80 mg total) into feeding tube daily.  05/03/21   Gifford Shave, MD  carvedilol (COREG) 25 MG tablet Place 1 tablet (25 mg total) into feeding tube 2 (two) times daily. 05/02/21   Gifford Shave, MD  dexamethasone (DECADRON) 6 MG tablet Place 1 tablet (6 mg total) into feeding tube daily. Last dose on 12/8 05/03/21   Gifford Shave, MD  Multiple Vitamins-Minerals (MULTIVITAMIN WITH MINERALS) tablet Place 1 tablet into feeding tube daily. 05/02/21   Gifford Shave, MD  Nutritional Supplements (FEEDING SUPPLEMENT, OSMOLITE 1.2 CAL,) LIQD Place 1,000 mLs into feeding tube  continuous. 05/02/21   Gifford Shave, MD  Nutritional Supplements (FEEDING SUPPLEMENT, PROSOURCE TF,) liquid Place 45 mLs into feeding tube daily. 05/03/21   Gifford Shave, MD  pantoprazole sodium (PROTONIX) 40 mg Place 40 mg into feeding tube daily. 05/02/21   Gifford Shave, MD  polyethylene glycol (MIRALAX / GLYCOLAX) 17 g packet Place 17 g into feeding tube daily as needed for moderate constipation or severe constipation. 05/02/21   Gifford Shave, MD  senna-docusate (SENOKOT-S) 8.6-50 MG tablet Place 1 tablet into feeding tube at bedtime as needed for mild constipation. 05/02/21   Gifford Shave, MD

## 2021-06-25 NOTE — Progress Notes (Signed)
Pharmacy Antibiotic Note  Jill Buchanan is a 61 y.o. female admitted on 06/25/2021 with pneumonia.  Pharmacy has been consulted for Vancomycin/Cefepime dosing. WBC elevated. Noted renal dysfunction.   Plan: Vancomycin 500 mg IV q24h >>>Estimated AUC: 534 Cefepime 2g IV q24h Trend WBC, temp, renal function  F/U infectious work-up Drug levels as indicated   Weight: 51.1 kg (112 lb 10.5 oz)  No data recorded.  Recent Labs  Lab 06/25/21 0405 06/25/21 0413  WBC 19.8*  --   CREATININE 1.79* 1.90*  LATICACIDVEN 5.9*  --     Estimated Creatinine Clearance: 24.8 mL/min (A) (by C-G formula based on SCr of 1.9 mg/dL (H)).    Allergies  Allergen Reactions   Eggs Or Egg-Derived Products     Narda Bonds, PharmD, BCPS Clinical Pharmacist Phone: [phone]

## 2021-06-25 NOTE — Assessment & Plan Note (Addendum)
-  Patient with L-sided PNA in the setting of prior recurrent strokes and known dysphagia, likely aspiration. She is now s/p complete course of antibiotics, stable, on room air, afebrile. Continue aspiration precautions

## 2021-06-25 NOTE — ED Provider Notes (Signed)
[facility] EMERGENCY DEPARTMENT Provider Note   CSN: [id number] Arrival date & time: 06/25/21  0329     History  Chief Complaint  Patient presents with   Shortness of Breath   Hypotension    Jill Buchanan is a 61 y.o. female.  Patient was sent to the emergency department for evaluation of difficulty breathing.  Patient comes to the ER by ambulance from [place] home.  Very little information is known.  Nursing home could not provide any information to EMS.  EMS was told that she has had previous stroke and her baseline mental status is poor.  Patient was reportedly noted to be having difficulty breathing tonight.  EMS reports that they found her on room air and her oxygen saturation were 70% with labored fast respirations.      Home Medications Prior to Admission medications   Medication Sig Start Date End Date Taking? Authorizing Provider  acetaminophen (TYLENOL) 500 MG tablet Place 1 tablet (500 mg total) into feeding tube every 4 (four) hours as needed for mild pain. 05/02/21   Gifford Shave, MD  amLODipine (NORVASC) 10 MG tablet Place 1 tablet (10 mg total) into feeding tube daily. 05/02/21   Gifford Shave, MD  apixaban (ELIQUIS) 5 MG TABS tablet Place 1 tablet (5 mg total) into feeding tube 2 (two) times daily. 05/02/21   Gifford Shave, MD  aspirin 81 MG chewable tablet Place 1 tablet (81 mg total) into feeding tube daily. 05/03/21   Gifford Shave, MD  atorvastatin (LIPITOR) 80 MG tablet Place 1 tablet (80 mg total) into feeding tube daily. 05/03/21   Gifford Shave, MD  carvedilol (COREG) 25 MG tablet Place 1 tablet (25 mg total) into feeding tube 2 (two) times daily. 05/02/21   Gifford Shave, MD  dexamethasone (DECADRON) 6 MG tablet Place 1 tablet (6 mg total) into feeding tube daily. Last dose on 12/8 05/03/21   Gifford Shave, MD  Multiple Vitamins-Minerals (MULTIVITAMIN WITH MINERALS) tablet Place 1 tablet into feeding tube daily.  05/02/21   Gifford Shave, MD  Nutritional Supplements (FEEDING SUPPLEMENT, OSMOLITE 1.2 CAL,) LIQD Place 1,000 mLs into feeding tube continuous. 05/02/21   Gifford Shave, MD  Nutritional Supplements (FEEDING SUPPLEMENT, PROSOURCE TF,) liquid Place 45 mLs into feeding tube daily. 05/03/21   Gifford Shave, MD  pantoprazole sodium (PROTONIX) 40 mg Place 40 mg into feeding tube daily. 05/02/21   Gifford Shave, MD  polyethylene glycol (MIRALAX / GLYCOLAX) 17 g packet Place 17 g into feeding tube daily as needed for moderate constipation or severe constipation. 05/02/21   Gifford Shave, MD  senna-docusate (SENOKOT-S) 8.6-50 MG tablet Place 1 tablet into feeding tube at bedtime as needed for mild constipation. 05/02/21   Gifford Shave, MD      Allergies    Eggs or egg-derived products    Review of Systems   Review of Systems  Physical Exam Updated Vital Signs BP (!) 79/59    Pulse 99    Resp (!) 45    Wt 51.1 kg    SpO2 (!) 89%    BMI 19.96 kg/m  Physical Exam Constitutional:      Appearance: She is ill-appearing.  HENT:     Head: Atraumatic.  Cardiovascular:     Rate and Rhythm: Regular rhythm. Tachycardia present.     Pulses: Normal pulses.  Pulmonary:     Effort: Tachypnea, accessory muscle usage and respiratory distress present.     Breath sounds: Rhonchi present.  Abdominal:  Palpations: Abdomen is soft.  Musculoskeletal:        General: Normal range of motion.  Skin:    General: Skin is warm and dry.     Findings: No rash.  Neurological:     GCS: GCS eye subscore is 1. GCS verbal subscore is 1. GCS motor subscore is 5.    ED Results / Procedures / Treatments   Labs (all labs ordered are listed, but only abnormal results are displayed) Labs Reviewed  LACTIC ACID, PLASMA - Abnormal; Notable for the following components:      Result Value   Lactic Acid, Venous 5.9 (*)    All other components within normal limits  COMPREHENSIVE METABOLIC PANEL - Abnormal;  Notable for the following components:   Sodium 154 (*)    Chloride 129 (*)    CO2 16 (*)    Glucose, Bld 278 (*)    BUN 84 (*)    Creatinine, Ser 1.79 (*)    Calcium 7.5 (*)    Total Protein 5.2 (*)    Albumin 2.2 (*)    GFR, Estimated 32 (*)    All other components within normal limits  CBC WITH DIFFERENTIAL/PLATELET - Abnormal; Notable for the following components:   WBC 19.8 (*)    RBC 2.56 (*)    Hemoglobin 6.8 (*)    HCT 25.0 (*)    MCHC 27.2 (*)    RDW 16.0 (*)    nRBC 0.6 (*)    Neutro Abs 17.8 (*)    Monocytes Absolute 1.2 (*)    All other components within normal limits  PROTIME-INR - Abnormal; Notable for the following components:   Prothrombin Time 22.2 (*)    INR 2.0 (*)    All other components within normal limits  I-STAT CHEM 8, ED - Abnormal; Notable for the following components:   Sodium 155 (*)    Potassium 5.5 (*)    Chloride 128 (*)    BUN 110 (*)    Creatinine, Ser 1.90 (*)    Glucose, Bld 310 (*)    Calcium, Ion 1.03 (*)    TCO2 17 (*)    Hemoglobin 7.8 (*)    HCT 23.0 (*)    All other components within normal limits  RESP PANEL BY RT-PCR (FLU A&B, COVID) ARPGX2  CULTURE, BLOOD (ROUTINE X 2)  CULTURE, BLOOD (ROUTINE X 2)  URINE CULTURE  APTT  LACTIC ACID, PLASMA  URINALYSIS, ROUTINE W REFLEX MICROSCOPIC  POC OCCULT BLOOD, ED  TYPE AND SCREEN  PREPARE RBC (CROSSMATCH)    EKG EKG Interpretation  Date/Time:  Friday June 25 2021 03:30:43 EST Ventricular Rate:  93 PR Interval:  134 QRS Duration: 76 QT Interval:  366 QTC Calculation: 456 R Axis:   98 Text Interpretation: Sinus or ectopic atrial rhythm Right axis deviation Consider left ventricular hypertrophy Borderline repolarization abnormality Confirmed by Orpah Greek [phone]) on 06/25/2021 3:42:12 AM  Radiology DG Chest Port 1 View  Result Date: 06/25/2021 CLINICAL DATA:  Shortness of breath and hypotension. EXAM: PORTABLE CHEST 1 VIEW COMPARISON:  Portable chest  04/27/2021. FINDINGS: Again noted are sternotomy sutures with aortic valve replacement. There is mild central vascular prominence but no findings of edema. Mild cardiomegaly. There is dense opacification of lower half of the left chest, and in the upper half of the left lung there is patchy airspace disease. There is a least a small or moderate-sized underlying left pleural effusion. Findings are worrisome for pneumonia, aspiration, and  reactive pleural effusion. Stable mediastinum. There is calcification in the aortic arch. Right lung is clear. Osteopenia. IMPRESSION: On the left, since 04/27/2021 there is new opacification in the lower half of the thorax with patchy airspace disease in the left upper lobe. Findings may suggest pneumonia or aspiration with reactive pleural effusion which could be small to moderate in size. Clinical correlation and radiographic follow-up are recommended. Right lung is clear. Mild cardiomegaly. Electronically Signed   By: Telford Nab M.D.   On: 06/25/2021 04:13    Procedures .Central Line  Date/Time: 06/25/2021 4:59 AM Performed by: Orpah Greek, MD Authorized by: Orpah Greek, MD   Consent:    Consent obtained:  Emergent situation Universal protocol:    Test results available: yes     Imaging studies available: yes     Required blood products, implants, devices, and special equipment available: yes     Site/side marked: yes     Immediately prior to procedure, a time out was called: yes     Patient identity confirmed:  Hospital-assigned identification number Pre-procedure details:    Indication(s): central venous access and insufficient peripheral access     Hand hygiene: Hand hygiene performed prior to insertion     Sterile barrier technique: All elements of maximal sterile technique followed     Skin preparation:  Chlorhexidine   Skin preparation agent: Skin preparation agent completely dried prior to procedure   Sedation:    Sedation  type:  None Anesthesia:    Anesthesia method:  Local infiltration   Local anesthetic:  Lidocaine 1% w/o epi Procedure details:    Location:  R femoral   Site selection rationale:  Patient on DOAC   Patient position:  Supine   Procedural supplies:  Triple lumen   Catheter size:  7 Fr   Landmarks identified: yes     Ultrasound guidance: yes     Ultrasound guidance timing: real time     Sterile ultrasound techniques: Sterile gel and sterile probe covers were used     Number of attempts:  1   Successful placement: yes   Post-procedure details:    Post-procedure:  Dressing applied and line sutured   Assessment:  Blood return through all ports and free fluid flow   Procedure completion:  Tolerated well, no immediate complications .Critical Care Performed by: Orpah Greek, MD Authorized by: Orpah Greek, MD   Critical care provider statement:    Critical care time (minutes):  35   Critical care time was exclusive of:  Separately billable procedures and treating other patients   Critical care was necessary to treat or prevent imminent or life-threatening deterioration of the following conditions:  Circulatory failure, CNS failure or compromise and respiratory failure   Critical care was time spent personally by me on the following activities:  Development of treatment plan with patient or surrogate, discussions with consultants, evaluation of patient's response to treatment, examination of patient, ordering and review of laboratory studies, ordering and review of radiographic studies, ordering and performing treatments and interventions, pulse oximetry, re-evaluation of patient's condition and review of old charts   Care discussed with: admitting provider      Medications Ordered in ED Medications  lactated ringers infusion (has no administration in time range)  lactated ringers bolus 1,000 mL (0 mLs Intravenous Stopped 06/25/21 0405)    And  lactated ringers bolus  1,000 mL (has no administration in time range)  metroNIDAZOLE (FLAGYL) IVPB 500 mg (500 mg  Intravenous New Bag/Given 06/25/21 0453)  vancomycin (VANCOCIN) IVPB 1000 mg/200 mL premix (has no administration in time range)  sodium chloride 0.9 % bolus 1,000 mL (has no administration in time range)  0.9 %  sodium chloride infusion (has no administration in time range)  ceFEPIme (MAXIPIME) 2 g in sodium chloride 0.9 % 100 mL IVPB (has no administration in time range)  vancomycin (VANCOREADY) IVPB 500 mg/100 mL (has no administration in time range)  ceFEPIme (MAXIPIME) 2 g in sodium chloride 0.9 % 100 mL IVPB (2 g Intravenous New Bag/Given 06/25/21 0435)    ED Course/ Medical Decision Making/ A&P                           Medical Decision Making Amount and/or Complexity of Data Reviewed Labs: ordered. Radiology: ordered. ECG/medicine tests: ordered.  Risk Prescription drug management.   Patient sent to the emergency department from nursing facility for difficulty breathing.  EMS report that she has had labored breathing, and respiratory distress.  Patient very hypoxic.  There is certainly concern for aspiration as patient has poor baseline mental status and previous strokes.  Reviewing our records reveals that the patient was made a DNR during her hospital stay here last month.  She did not, however, arrived with a DNR paper.  The nursing home is not answering their phone to ask any further questions.  Patient's contact is her daughter but she was not answering her phone.  I therefore contacted the patient's next contact which is her sister, Jill Buchanan.  Ms. Delphia Grates confirmed that the patient does not want CPR or ventilation, but is not her power of attorney.  She is not available to come to the ER currently.  She will try to contact patient's daughter.   Patient was hypoxic, hypotensive, tachypneic and tachycardic at arrival.  She was treated as presumed sepsis.  This included fluid  resuscitation and broad-spectrum antibiotics.  Blood pressure is improved.  She has no cough reflex.  Oxygen saturations do significantly improved with deep suctioning, but she cannot clear her secretions.  Based on what is known, however, she does not want to be intubated.  Respiratory therapy performing periodic suctioning.  Patient found to be anemic.  Her nursing home [facility] lists Eliquis, prior records indicate history of atrial fibrillation.  Unclear if she is taking or when last dose it was if she is.    Reviewing records from hospitalization on November 29 reveals sepsis secondary to COVID-19, altered mental status, acute GI bleed.  At that time a palliative care consult was performed and the resident documentation makes it clear that she is a DNR.  As I do not have family present to make the patient comfort care, she was continued on antibiotics.  I did place a central line to help with blood draw as well as facilitate medication administration and blood transfusion.  This will also be available for pressors if needed until family is available to make definitive decisions on patient's care plan.  Patient will be readmitted to the hospital for further management.         Final Clinical Impression(s) / ED Diagnoses Final diagnoses:  Respiratory distress    Rx / DC Orders ED Discharge Orders     None         Rishita Petron, Gwenyth Allegra, MD 06/25/21 [phone]

## 2021-06-25 NOTE — Consult Note (Signed)
Consultation Note Date: 06/25/2021   Patient Name: Jill Buchanan  DOB: Dec 20, 1960  MRN: [id number]  Age / Sex: 61 y.o., female  PCP: Pcp, No Referring Physician: Spero Geralds, MD  Reason for Consultation: Establishing goals of care  HPI/Patient Profile: 61 y.o. female  with past medical history of multiple strokes, anemia, GIB, A. 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   Aspiration pneumonia suspected.  Patient receiving IV antibiotics.  Patient also found to be dehydrated with electrolyte imbalance.  Fluid resuscitation in place.  EEG performed and moderate diffuse encephalopathy with nonspecific etiology and no seizure activity.  Goals of care discussion with CCM and family at bedside with PMT present.  Clinical Assessment and Goals of Care: I have reviewed medical records including EPIC notes, labs and imaging, assessed the patient and then met with patient, patient's daughter Jill Buchanan, and patient's Sister Jill Buchanan to discuss diagnosis prognosis, [place], EOL wishes, disposition and options.  I introduced Palliative Medicine as specialized medical care for people living with serious illness. It focuses on providing relief from the symptoms and stress of a serious illness. The goal is to improve quality of life for both the patient and the family.  We discussed a brief life review of the patient.  Family shares patient is stubborn and has a great sense of humor.  As far as functional and nutritional status patient was able to transfer with 1 assist and was Cheek/pocketing food on her pured diet.  Daughter Jill Buchanan shares plan was to change diet to thin liquids which she believes resulted in current aspiration.  We discussed patient's current illness and what it  means in the larger context of patient's on-going co-morbidities. I attempted to elicit values and goals of care important to the patient.  Daughter and sister at bedside were in agreement that they did not want the patient to suffer.  They would like to be able to treat the treatable but if patient's current status does not reverse they would like to enact the patient's hospice benefits.  The difference between aggressive medical intervention and comfort care was considered in light of the patient's goals of care.  Family has a firm spiritual belief that they do not say goodbye to the patient until the patient is actively dying.  Daughter does not want to call family in to see patient and say final goodbyes unless patient is dying.  Therefore, if patient starts to decline, family would like to be notified, allow family to come and visit.  Family confirmed they agree with short-term ventilation in order to keep the patient alive for family members to come and say goodbye if patient has reached end-of-life.  Education offered regarding concept specific to human mortality and the limitations of medical interventions to prolong life when the body begins to fail to thrive.   Discussed with patient/family the importance of continued conversation with family  and the medical providers regarding overall plan of care and treatment options, ensuring decisions are within the context of the patients values and GOCs.    Questions and concerns were addressed. The family was encouraged to call with questions or concerns.   Primary Decision Maker NEXT OF KIN  Code Status/Advance Care Planning: Limited code  Prognosis:   Unable to determine  Discharge Planning: To Be Determined  Primary Diagnoses: Present on Admission:  Aspiration pneumonia ([place])  Severe sepsis with septic shock (HCC)  Recurrent cerebrovascular accidents (CVAs) ([place])  Anemia  Atrial fibrillation, chronic (HCC)  CAD, multiple vessel   Acute renal failure (ARF) ([place])  Sepsis ([place])   Physical Exam Vitals and nursing note reviewed.  Constitutional:      General: She is not in acute distress.    Appearance: She is not ill-appearing.  HENT:     Head: Normocephalic.  Eyes:     Comments: Bilateral blindness  Cardiovascular:     Rate and Rhythm: Normal rate.  Pulmonary:     Effort: Tachypnea present.     Breath sounds: Decreased breath sounds present.  Abdominal:     Palpations: Abdomen is soft.  Musculoskeletal:     Comments: Bednound, generalized weakness  Neurological:     Comments: nonverbal    Palliative Assessment/Data: 30%     I discussed this patient's plan of care with Dr. Shearon Stalls, NP Alfredo Martinez, nursing, patient, patient's daughter and sister.  Thank you for this consult. Palliative medicine will continue to follow and assist holistically.   Time Total: 75 minutes Greater than 50%  of this time was spent counseling and coordinating care related to the above assessment and plan.  Signed by: Jordan Hawks, DNP, FNP-BC Palliative Medicine    Please contact Palliative Medicine Team phone at [phone] for questions and concerns.  For individual provider: See Shea Evans

## 2021-06-25 NOTE — H&P (Addendum)
History and Physical    Patient: Jill Buchanan [id number] DOB: 1960-06-24 DOA: 06/25/2021 DOS: the patient was seen and examined on 06/25/2021 PCP: Pcp, No  Patient coming from: SNF [facility]; NOK: Daughter, Delma Freeze, [phone]   Chief Complaint: SOB  HPI: [name] is a 61 y.o. female with medical history significant of CVA with chronic debility; CAD s/p CABG; dysphagia with PEG tube; and afib presenting with SOB. She was previously admitted from 11/29-12/4 with acute hypoxic respiratory failure associated with COVID-19 infection and was treated with Remdesivir and Decadron.  She had additional strokes during her prior hospitalization with Xarelto failure and so was changed to Eliquis and ASA.  She had concern for GI bleeding and was transfused 2 units PRBC but EGD was deferred at that time.  Palliative care consulted and the patient was made DNR with plan for outpatient palliative care discussions ongoing.  I spoke with her daughter.  Her daughter reports that she is en route.  She was able to speak some and doing ok.  She went to SNF and was making improvements but not significantly so.  They were planning to come see her next week - speech wanted to do a swallow evaluation.  Last night, they called to report the need for transfer to the hospital.  She will be at the 19-month mark from her stroke is in March and her daughter "wants to give her a fair fighting chance."  She would want her to be intubated to give her a chance.  She has been on pureed thick but may have tried to give her thin liquids.  Her daughter would like have a head CT for comparison.      ER Course:  Carryover, per Dr. Cyd Silence:  Patient presenting via EMS with shortness of breath, hypoxic at 70% initially.  Patient found to be extremely hypotensive and septic with septic shock, systolic blood pressure still ranging 70s to 80s after 30 cc/kg of crystalloid administered with hemoglobin in the 6 range.   ER provider will order blood.  ER provider asked if we could admit since patient is DNR and family has been having palliative care discussions.  That being said patient is technically not comfort measures and therefore I declined and asked that PCCM see the patient first for consideration of admission and initiation of vasopressors.  ER provider has already placed central line.   UPDATE - 6:45AM ICU attending went to evaluate patient and did not feel that the patient warranted ICU admission.  Blood pressures are rising and patient is making adequate urine.  I went to personally evaluate the patient at bedside and confirmed that blood pressures are indeed trending upwards.  Patient to receive 2 units of packed red blood cells according to ER provider.     Review of Systems: Unable to review all systems due to lack of cooperation from patient. 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 No pertinent surgical history. Social History:  reports that she does not currently use alcohol. She reports that she does not use drugs. No history on  file for tobacco use.  Allergies  Allergen Reactions   Eggs Or Egg-Derived Products     History reviewed. No pertinent family history.  Prior to Admission medications   Medication Sig Start Date End Date Taking? Authorizing Provider  acetaminophen (TYLENOL) 500 MG tablet Place 1 tablet (500 mg total) into feeding tube every 4 (four) hours as needed for mild pain. 05/02/21   Gifford Shave, MD  amLODipine (NORVASC) 10 MG tablet Place 1 tablet (10 mg total) into feeding tube daily. 05/02/21   Gifford Shave, MD  apixaban  (ELIQUIS) 5 MG TABS tablet Place 1 tablet (5 mg total) into feeding tube 2 (two) times daily. 05/02/21   Gifford Shave, MD  aspirin 81 MG chewable tablet Place 1 tablet (81 mg total) into feeding tube daily. 05/03/21   Gifford Shave, MD  atorvastatin (LIPITOR) 80 MG tablet Place 1 tablet (80 mg total) into feeding tube daily. 05/03/21   Gifford Shave, MD  carvedilol (COREG) 25 MG tablet Place 1 tablet (25 mg total) into feeding tube 2 (two) times daily. 05/02/21   Gifford Shave, MD  dexamethasone (DECADRON) 6 MG tablet Place 1 tablet (6 mg total) into feeding tube daily. Last dose on 12/8 05/03/21   Gifford Shave, MD  Multiple Vitamins-Minerals (MULTIVITAMIN WITH MINERALS) tablet Place 1 tablet into feeding tube daily. 05/02/21   Gifford Shave, MD  Nutritional Supplements (FEEDING SUPPLEMENT, OSMOLITE 1.2 CAL,) LIQD Place 1,000 mLs into feeding tube continuous. 05/02/21   Gifford Shave, MD  Nutritional Supplements (FEEDING SUPPLEMENT, PROSOURCE TF,) liquid Place 45 mLs into feeding tube daily. 05/03/21   Gifford Shave, MD  pantoprazole (PROTONIX) 40 MG tablet Take 40 mg by mouth daily. 05/21/21   [provider]  pantoprazole sodium (PROTONIX) 40 mg Place 40 mg into feeding tube daily. 05/02/21   Gifford Shave, MD  polyethylene glycol (MIRALAX / GLYCOLAX) 17 g packet Place 17 g into feeding tube daily as needed for moderate constipation or severe constipation. 05/02/21   Gifford Shave, MD  senna-docusate (SENOKOT-S) 8.6-50 MG tablet Place 1 tablet into feeding tube at bedtime as needed for mild constipation. 05/02/21   Gifford Shave, MD    Physical Exam: Vitals:   06/25/21 0955 06/25/21 1000 06/25/21 1015 06/25/21 1030  BP: 93/76 94/61 (!) 88/65 (!) 85/73  Pulse: 90 89 89 89  Resp: (!) 33 (!) 35 (!) 27 (!) 26  Temp: (!) 101.2 F (38.4 C) (!) 101.3 F (38.5 C) (!) 101.2 F (38.4 C) (!) 101.1 F (38.4 C)  TempSrc:      SpO2: 95% 95% 95% 95%  Weight:        General:  Appears critically ill Eyes:   normal lids, eyes rolled back when lids manually opened ENT:  dry lips & tongue, dry mm; tongue trauma noted Neck:  no LAD, masses or thyromegaly Cardiovascular:  RRR, no m/r/g. No LE edema.  Respiratory:   Diffuse rhonchi.  Increased respiratory effort to the 40s on 45L HFNC. Abdomen:  soft, NT, ND; PEG tube in place Skin:  no rash or induration seen on limited exam Musculoskeletal:  decreased tone BUE/BLE,  no bony abnormality Lower extremity:  No LE edema.  Limited foot exam with no ulcerations.  2+ distal pulses. Psychiatric:  obtunded, unresponsive other than to pain Neurologic:  unable to perform   Radiological Exams on Admission: Independently reviewed - see discussion in A/P where applicable  DG Chest Port 1 View  Result Date: 06/25/2021 CLINICAL DATA:  Shortness of  breath and hypotension. EXAM: PORTABLE CHEST 1 VIEW COMPARISON:  Portable chest 04/27/2021. FINDINGS: Again noted are sternotomy sutures with aortic valve replacement. There is mild central vascular prominence but no findings of edema. Mild cardiomegaly. There is dense opacification of lower half of the left chest, and in the upper half of the left lung there is patchy airspace disease. There is a least a small or moderate-sized underlying left pleural effusion. Findings are worrisome for pneumonia, aspiration, and reactive pleural effusion. Stable mediastinum. There is calcification in the aortic arch. Right lung is clear. Osteopenia. IMPRESSION: On the left, since 04/27/2021 there is new opacification in the lower half of the thorax with patchy airspace disease in the left upper lobe. Findings may suggest pneumonia or aspiration with reactive pleural effusion which could be small to moderate in size. Clinical correlation and radiographic follow-up are recommended. Right lung is clear. Mild cardiomegaly. Electronically Signed   By: Telford Nab M.D.   On: 06/25/2021 04:13     EKG: Independently reviewed.  NSR with rate 93; marked lateral ST depression that is new from prior   Labs on Admission: I have personally reviewed the available labs and imaging studies at the time of the admission.  Pertinent labs:    Na++ 154 CO2 16 Glucose 278 BUN 84/Creatinine 1.79/GFR 32 Albumin 2.2 Lactate 5.9, 2.7 WBC 19.8 Hgb 6.8 INR 2.0 COVID/flu negative UA: 100 protein, few bacteria   Assessment/Plan * Severe sepsis with septic shock (HCC)- (present on admission) -SIRS criteria in this patient includes: Leukocytosis, fever, tachypnea, hypoxia  -Patient has evidence of acute organ failure with elevated lactate >2; encephalopathy; recurrent hypotension (SBP < 90 or MAP < 65 x 2 readings) that is not easily explained by another condition. -While awaiting blood cultures, this appears to be a preseptic condition. -Sepsis protocol initiated -Worse outcomes are predicted from sepsis with 2 of the following: RR > 22; AMS , GCS < 13; or SBP <100.  This patient meets all of these criteria. -Patient had initial lactate >4 and SBP <90/MAP <65 and so has received the 30 cc/kg IVF bolus. -She has criteria for shock and this is marginally improved since arrival; she remains with tenuous BP, markedly increased respiratory effort with RR 40s, and need for 45L HFNC; as such, PCCM re-consultation has been requested. -Suspected source is L-sided PNA, likely related to aspiration PNA. -Blood and urine cultures pending -Will admit due to: hemodynamic instability; currently requested progressive bed but PCCM asked to consider ICU admission given family desire for full code -Treat with IV Cefepime/Vanc -Will trend lactate to ensure improvement -Will order lower respiratory tract procalcitonin level.   Acute renal failure (ARF) (HCC)- (present on admission) -Normal renal function on prior admission -Creatinine now 1.79, GFR 32 -Also with hypernatremia -Very likely profound dehydration  in the setting of sepsis -Anticipate improvement with hydration, although her multi-system organ failure is concerning with the overall picture and indicates poor overall prognosis  Goals of care, counseling/discussion -Patient previously was DNR during her last hospitalization -Based on my discussion with her daughter today by telephone (she was en route to the hospital so further discussion is likely to occur), she wants to give her a "fair fighting chance" and would be ok with intubation, limited duration of full intervention to see if she improves -The severity of her condition was discussed but she may better understand once she is physically present -PCCM to also discuss further -Will place palliative care consult -Based on multisystem organ  failure in the setting of baseline severe debility, poor prognosis and comfort care would be reasonable if the family were in agreement  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 (CVA) -Has PEG tube and receives tube feeds -However, according to her daughter, she has also been taking some pureed liquids and speech may have given thin liquids this week; she was planned for an invasive swallow evaluation soon -Hold tube feeds in the setting of probable aspiration PNA  CAD, multiple vessel- (present on admission) -s/p CABG -EKG with new significant lateral ST depressions -Will trend troponin -However, she is critically ill and not a candidate for CV intervention currently -Consider heparin risks/benefits based on severity of troponin leak  Atrial fibrillation, chronic (HCC)- (present on admission) -Rate controlled currently without medication -Her BP is too tenuous to allow for rate-controlling agent like CCB/BB -For now, will monitor without intervention -She is also at significant risk for recurrent stroke given prior recurrence despite DOAC therapy -Given GI bleeding, will hold Eliquis for now - at the risk of high stroke  recurrence -Resume Eliquis as soon as safe to do so  Aspiration pneumonia ([place])- (present on admission) -Patient with L-sided PNA in the setting of prior recurrent strokes and known dysphagia -Likely aspiration-related -Hold tube feeds -Treat with Cefepime/Vanc for now, as above  Anemia- (present on admission) -Patient with recurrent anemia, normocytic -Heme positive on 11/29; will repeat -Likely recurrent occult bleeding -She is not stable for GI intervention at this time (was deferred during last admission) -Hold Eliquis for now and follow -Transfuse as needed to keep Hgb >8 given cardiac history -Will check post-transfusion CBC and again in AM (more frequently if she has frank bleeding) -IV PPI for now  Recurrent cerebrovascular accidents (CVAs) ([place])- (present on admission) -Multiple prior strokes -Her daughter reports studies at [facility] and [place] reporting that 6 month scans will indicate the severity of the stroke and long-term prognosis and requests repeat CT now (at 3 months) -She is quite altered and so head CT without contrast is reasonable, ordered -Meanwhile, strokes put her at increased risk of seizures and she is altered with tongue trauma; will also order EEG -However, current presentation is much more c/w sepsis than primarily neurologic cause    Advance Care Planning:   Code Status: Partial Code   Consults: PCCM; Palliative care  Family Communication: I spoke with her daughter by telephone at the time of admission  Severity of Illness: The appropriate patient status for this patient is INPATIENT. Inpatient status is judged to be reasonable and necessary in order to provide the required intensity of service to ensure the patient's safety. The patient's presenting symptoms, physical exam findings, and initial radiographic and laboratory data in the context of their chronic comorbidities is felt to place them at high risk for further clinical deterioration.  Furthermore, it is not anticipated that the patient will be medically stable for discharge from the hospital within 2 midnights of admission.   * I certify that at the point of admission it is my clinical judgment that the patient will require inpatient hospital care spanning beyond 2 midnights from the point of admission due to high intensity of service, high risk for further deterioration and high frequency of surveillance required.*    Total critical care time: 65 minutes Critical care time was exclusive of separately billable procedures and treating other patients. Critical care was necessary to treat or prevent imminent or life-threatening deterioration. Critical care was time spent personally by me on  the following activities: development of treatment plan with patient and/or surrogate as well as nursing, discussions with consultants, evaluation of patient's response to treatment, examination of patient, obtaining history from patient or surrogate, ordering and performing treatments and interventions, ordering and review of laboratory studies, ordering and review of radiographic studies, pulse oximetry and re-evaluation of patient's condition.     Author: Karmen Bongo, MD 06/25/2021 11:16 AM  For on call review [email or web address].

## 2021-06-25 NOTE — Assessment & Plan Note (Addendum)
-  Multiple prior strokes

## 2021-06-25 NOTE — Assessment & Plan Note (Addendum)
-  Continue Coreg, Eliquis

## 2021-06-26 ENCOUNTER — Inpatient Hospital Stay (HOSPITAL_COMMUNITY): Payer: Medicaid Other

## 2021-06-26 DIAGNOSIS — J690 Pneumonitis due to inhalation of food and vomit: Secondary | ICD-10-CM | POA: Diagnosis not present

## 2021-06-26 DIAGNOSIS — N170 Acute kidney failure with tubular necrosis: Secondary | ICD-10-CM | POA: Diagnosis not present

## 2021-06-26 DIAGNOSIS — I69391 Dysphagia following cerebral infarction: Secondary | ICD-10-CM

## 2021-06-26 DIAGNOSIS — A419 Sepsis, unspecified organism: Secondary | ICD-10-CM | POA: Diagnosis not present

## 2021-06-26 DIAGNOSIS — I482 Chronic atrial fibrillation, unspecified: Secondary | ICD-10-CM | POA: Diagnosis not present

## 2021-06-26 LAB — BPAM RBC
Blood Product Expiration Date: 202301312359
Blood Product Expiration Date: 202301312359
ISSUE DATE / TIME: 202301270637
ISSUE DATE / TIME: 202301270925
Unit Type and Rh: 9500
Unit Type and Rh: 9500

## 2021-06-26 LAB — CBC
HCT: 27.7 % — ABNORMAL LOW (ref 36.0–46.0)
Hemoglobin: 9.1 g/dL — ABNORMAL LOW (ref 12.0–15.0)
MCH: 28.1 pg (ref 26.0–34.0)
MCHC: 32.9 g/dL (ref 30.0–36.0)
MCV: 85.5 fL (ref 80.0–100.0)
Platelets: 140 10*3/uL — ABNORMAL LOW (ref 150–400)
RBC: 3.24 MIL/uL — ABNORMAL LOW (ref 3.87–5.11)
RDW: 15.6 % — ABNORMAL HIGH (ref 11.5–15.5)
WBC: 20 10*3/uL — ABNORMAL HIGH (ref 4.0–10.5)
nRBC: 0.2 % (ref 0.0–0.2)

## 2021-06-26 LAB — TYPE AND SCREEN
ABO/RH(D): O POS
Antibody Screen: NEGATIVE
Unit division: 0
Unit division: 0

## 2021-06-26 LAB — BASIC METABOLIC PANEL
Anion gap: 9 (ref 5–15)
BUN: 45 mg/dL — ABNORMAL HIGH (ref 8–23)
CO2: 21 mmol/L — ABNORMAL LOW (ref 22–32)
Calcium: 8.7 mg/dL — ABNORMAL LOW (ref 8.9–10.3)
Chloride: 124 mmol/L — ABNORMAL HIGH (ref 98–111)
Creatinine, Ser: 1 mg/dL (ref 0.44–1.00)
GFR, Estimated: >60 mL/min (ref >60)
Glucose, Bld: 112 mg/dL — ABNORMAL HIGH (ref 70–99)
Potassium: 3.1 mmol/L — ABNORMAL LOW (ref 3.5–5.1)
Sodium: 154 mmol/L — ABNORMAL HIGH (ref 135–145)

## 2021-06-26 LAB — MAGNESIUM: Magnesium: 2 mg/dL (ref 1.7–2.4)

## 2021-06-26 LAB — GLUCOSE, CAPILLARY
Glucose-Capillary: 73 mg/dL (ref 70–99)
Glucose-Capillary: 105 mg/dL — ABNORMAL HIGH (ref 70–99)
Glucose-Capillary: 109 mg/dL — ABNORMAL HIGH (ref 70–99)
Glucose-Capillary: 93 mg/dL (ref 70–99)
Glucose-Capillary: 94 mg/dL (ref 70–99)
Glucose-Capillary: 94 mg/dL (ref 70–99)

## 2021-06-26 LAB — VANCOMYCIN, RANDOM: Vancomycin Rm: 12

## 2021-06-26 IMAGING — DX DG CHEST 1V PORT
1 series · 1 of 1 positions shown · non-contrast
Comparison: Yesterday

CLINICAL DATA: Increased tracheal secretions

EXAM:
PORTABLE CHEST 1 VIEW

[chest]
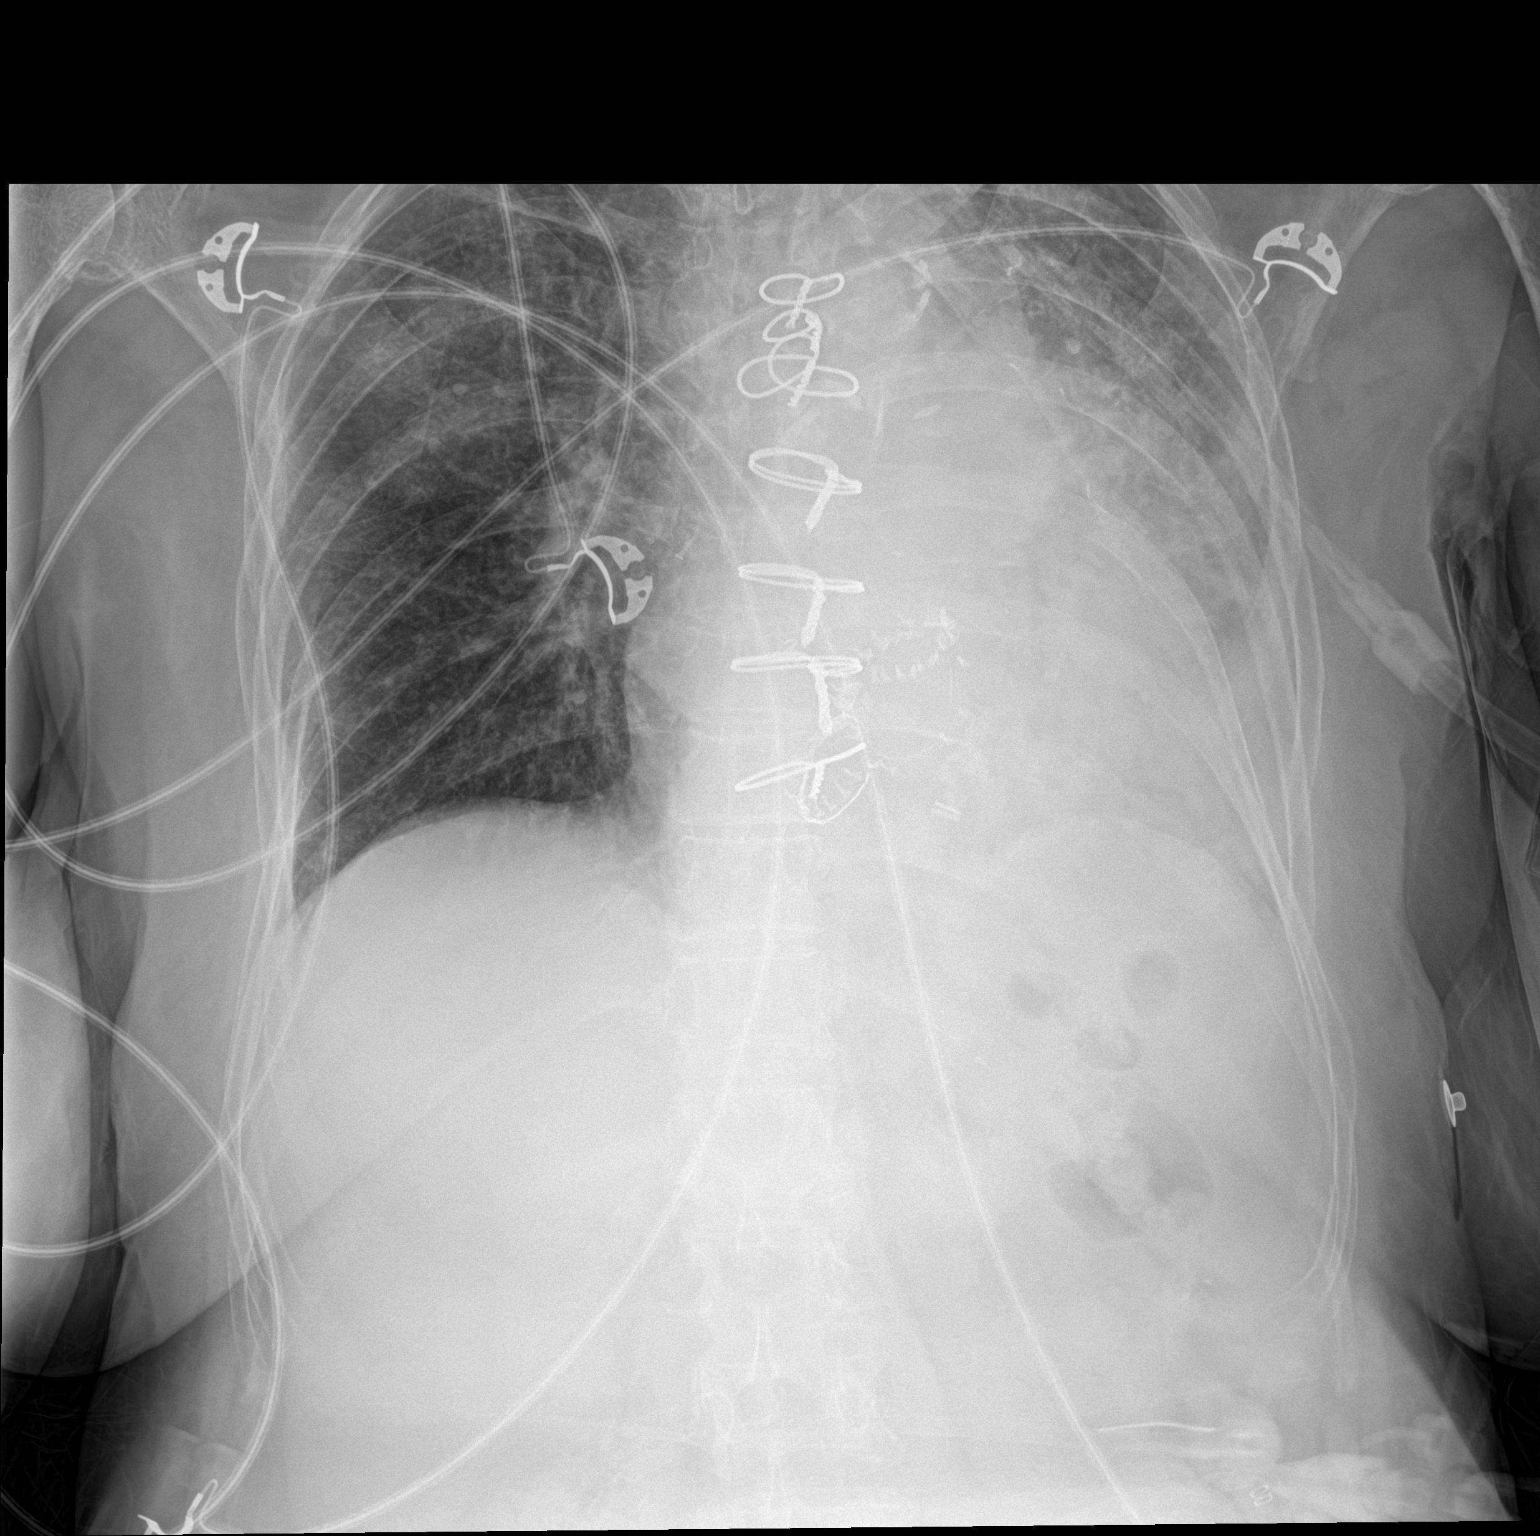

[1 of 1 positions shown; findings below may reference images not displayed]

FINDINGS: New hazy density at the right apex. Extensive opacity on the left
with some volume loss, similar. Cardiomegaly. AV valve replacements.
There could be a left pleural effusion. No pneumothorax.
IMPRESSION: Extensive left and new right upper airspace opacity consistent with
worsening pneumonia.

## 2021-06-26 MED ORDER — FREE WATER
300.0000 mL | Status: DC
  Administered 2021-06-26 – 2021-07-03 (×44): 300 mL

## 2021-06-26 MED ORDER — VANCOMYCIN HCL 750 MG/150ML IV SOLN
750.0000 mg | INTRAVENOUS | Status: DC
  Administered 2021-06-26 – 2021-06-27 (×2): 750 mg via INTRAVENOUS
  Filled 2021-06-26 (×2): qty 150

## 2021-06-26 MED ORDER — DEXTROSE 5 % IV SOLN: INTRAVENOUS | Status: DC

## 2021-06-26 MED ORDER — CHLORHEXIDINE GLUCONATE 0.12 % MT SOLN
15.0000 mL | Freq: Two times a day (BID) | OROMUCOSAL | Status: DC
  Administered 2021-06-26 – 2021-07-03 (×16): 15 mL via OROMUCOSAL
  Filled 2021-06-26 (×13): qty 15

## 2021-06-26 MED ORDER — MAGNESIUM SULFATE 4 GM/100ML IV SOLN
4.0000 g | Freq: Once | INTRAVENOUS | Status: DC
  Filled 2021-06-26: qty 100

## 2021-06-26 MED ORDER — SODIUM CHLORIDE 0.9 % IV SOLN
2.0000 g | Freq: Two times a day (BID) | INTRAVENOUS | Status: DC
  Administered 2021-06-26 – 2021-06-27 (×3): 2 g via INTRAVENOUS
  Filled 2021-06-26 (×3): qty 2

## 2021-06-26 MED ORDER — SODIUM CHLORIDE 0.9 % IV SOLN: INTRAVENOUS | Status: DC | PRN

## 2021-06-26 MED ORDER — ORAL CARE MOUTH RINSE
15.0000 mL | Freq: Two times a day (BID) | OROMUCOSAL | Status: DC
  Administered 2021-06-26 – 2021-07-03 (×16): 15 mL via OROMUCOSAL

## 2021-06-26 MED ORDER — POTASSIUM CHLORIDE 20 MEQ PO PACK
40.0000 meq | PACK | ORAL | Status: AC
  Administered 2021-06-26 (×2): 40 meq
  Filled 2021-06-26 (×2): qty 2

## 2021-06-26 MED ORDER — POTASSIUM CHLORIDE 10 MEQ/50ML IV SOLN
10.0000 meq | INTRAVENOUS | Status: DC
  Administered 2021-06-26 (×2): 10 meq via INTRAVENOUS
  Filled 2021-06-26 (×4): qty 50

## 2021-06-26 NOTE — Progress Notes (Signed)
eLink Physician-Brief Progress Note Patient Name: Jill Buchanan DOB: [birth year] MRN: [id number]   Date of Service  06/26/2021  HPI/Events of Note  Notified of glucose 78 NPO status due to aspiration risk Na 154  eICU Interventions  Start D5W at 10 cc/hr     Intervention Category Intermediate Interventions: Other:  Darl Pikes 06/26/2021, 4:34 AM

## 2021-06-26 NOTE — Progress Notes (Signed)
eLink Physician-Brief Progress Note Patient Name: Jill Buchanan DOB: June 24, 1960 MRN: [id number]   Date of Service  06/26/2021  HPI/Events of Note  Notified of patient having thick secretions and request for CXR Already on vancomycin and cefepime Cultures in process  eICU Interventions  Repeat CXR Bedside RN to inform Elink once cxr done     Intervention Category Intermediate Interventions: Other:  Darl Pikes 06/26/2021, 3:25 AM

## 2021-06-26 NOTE — Progress Notes (Signed)
Pharmacy Antibiotic Note  Jill Buchanan is a 61 y.o. female admitted on 06/25/2021 with pneumonia.  Pharmacy has been consulted for Vancomycin/Cefepime dosing. WBC elevated. Noted renal dysfunction. 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   SCR improved at 1. (Baseline 0.5-0.7).  Good UOP with 2.7 L out and nice and clear.  LA 2.7 >>4.6. WBC remains elevated at 20.  Temperature curve is trending down.  Cultures pending.  Vancomycin random level 12 this AM ~24 hours after loading dose on 1/27. Will schedule dosing with improving renal function.   Plan: Adjust Cefepime to 2g IV every 12 hours.  Resume Vancomycin at 750 mg IV every 24 hours >>>Estimated AUC: 515 (SCr used 1) Trend WBC, temp, renal function  F/U infectious work-up Drug levels as indicated   Weight: 48.6 kg (107 lb 2.3 oz)  Temp (24hrs), Avg:100.8 F (38.2 C), Min:98.4 F (36.9 C), Max:102.2 F (39 C)  Recent Labs  Lab 06/25/21 0329 06/25/21 0405 06/25/21 0413 06/25/21 0630 06/25/21 1431 06/25/21 1820 06/26/21 0351  WBC 20.7* 19.8*  --   --   --  21.1* 20.0*  CREATININE  --  1.79* 1.90*  --   --   --  1.00  LATICACIDVEN  --  5.9*  --  2.7* 4.6*  --   --   VANCORANDOM  --   --   --   --   --   --  12    Estimated Creatinine Clearance: 44.8 mL/min (by C-G formula based on SCr of 1 mg/dL).    Allergies  Allergen Reactions   Eggs Or Egg-Derived Products     Sloan Leiter, PharmD, BCPS, BCCCP Clinical Pharmacist Please refer to [facility] for [place] numbers 06/26/2021, 7:41 AM

## 2021-06-26 NOTE — Progress Notes (Signed)
[facility] ADULT ICU REPLACEMENT PROTOCOL   The patient does apply for the [facility] Adult ICU Electrolyte Replacment Protocol based on the criteria listed below:   1.Exclusion criteria: TCTS patients, ECMO patients, and Dialysis patients 2. Is GFR >/= 30 ml/min? Yes.    Patient's GFR today is >60 3. Is SCr </= 2? Yes.   Patient's SCr is 1.00 mg/dL 4. Did SCr increase >/= 0.5 in 24 hours? No. 5.Pt's weight >40kg  Yes.   6. Abnormal electrolyte(s): K+ 3.1  7. Electrolytes replaced per protocol 8.  Call MD STAT for K+ </= 2.5, Phos </= 1, or Mag </= 1 Physician:  n/a  Jill Buchanan 06/26/2021 4:28 AM

## 2021-06-26 NOTE — Progress Notes (Signed)
NAME:  Jill Buchanan, MRN:  [id number], DOB:  04/01/1961, LOS: 1 ADMISSION DATE:  06/25/2021, CONSULTATION DATE: June 25, 2021 REFERRING MD: ED physician, CHIEF COMPLAINT: Hypoxia tachypnea and altered mental status  History of Present Illness:  61 year old F, SNF resident, who presented to [facility] ER with reports of poor intake and low oxygen saturations.   The patient lives in a SNF due to severe debility in the setting of multiple strokes.  She also has a hx of anemia, GIB, AF on anticoagulation.  In December 2022, she was admitted to the hospital and seen by Palliative Care and was a DNR (on current admission this was confirmed by nursing home paperwork and sister by phone).   At the SNF, she was noted to have poor intake, concern for dehydration, tachypnea and low oxygen saturations.  She was suctioned several times and placed on oxygen.  There were concerns she had not taken in fluids in several days.  Initially staff were unable to reach daughter [facility]) but sister confirmed she was DNR on phone per staff.  Initial work up notable for Na 155, K 5.5, Cl 128, glucose 310, BUN 110, Cr 1.90, WBC 19.8, Hgb 6.8 and platelets 240.  She was treated with IVF, 2 units PRBC. She was febrile with CXR concerning for LUL aspiration PNA. Initial lactic acid 5.9 which cleared to 2.7 after fluid resuscitation.  Foley catheter was placed in the ER.   PCCM called for evaluation.    Daughter reports pt was standing at SNF with 1 assist.  Had been tolerating spoonfuls of water / thickened liquids.  She had difficulties with pocketing food per daughter and diet was changed.  There were concerns for aspiration at the facility.  She has been TF dependent.   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  Femoral central line placed in ER.     Subjective:   Overnight hypoglycemic, worsening respiratory secretions  Objective   Blood pressure (!) 146/66, pulse 75, temperature 98.8 F (37.1 C), resp. rate (!) 24, weight 48.6 kg, SpO2 100 %.        Intake/Output Summary (Last 24 hours) at 06/26/2021 1104 Last data filed at 06/26/2021 1000 Gross per 24 hour  Intake 2577.5 ml  Output 3100 ml  Net -522.5 ml   Filed Weights   06/25/21 0336 06/26/21 0500  Weight: 51.1 kg 48.6 kg    Examination: Chronically ill appearing, cachectic, frail [place] in eyes Opaque corneas, appears blind RRR no mrg Thin no edema, decreased muscle bulk and tone Labored breathing, diminished breath sounds on the left  Chest xray shows left sided opacities with evolving RUL pneumonia Na 154 Mag 2.0 K 3.1   Assessment & Plan:   Severe Sepsis  Dehydration Failure to thrive Profound volume depletion with anemia. New LUL infiltrate. S/p IVF + 2 units PRBC in ER.  -assess PCT -follow lactic acid  -assess blood cultures, urine culture  -history of VRE, continue vanc and cefepime for now  Hypoglycemia - D5 started overnight. Will increase due to hypernatremia as below to 50 ml/hr - consider tube feeds pending monitoring for GI bleed as below  Left sided PNA, evolving RUL infiltrates Acute Hypoxic Respiratory Failure  -cefepime / vancomycin per pharmacy  -wean O2 for sats >90% -at risk for intubation / failure >  daughter wants intubated only if family can not make it to say goodbye to the patient.  She would want a call before intubation to transition to comfort measures.  -NTS PRN  -aspiration precautions, may need SLP once more stable  -NPO  Anemia  Query acute on chronic blood loss. No clear source of bleeding, hx of prior GIB, suspect low volume chronic GI loss superimposed on chronic disease.  -trend CBC -transfuse for Hgb <7% -follow up CBC post transfusion  -assess FOBT  -not currently a candidate for GI evaluation   Hyponatremia,  Hypochloremia  Hypokalemia AKI  In setting of profound volume depletion, anemia  -Trend BMP / urinary output -increase free H20 and D5 as above -Replace electrolytes as indicated, enteral K replacement -Avoid nephrotoxic agents, ensure adequate renal perfusion  AF on Chronic Anticoagulation HTN, HLD  CAD s/p CABG  -tele monitoring  -hold home anticoagulation given anemia / transfusions - question of blood loss anemia. On BID PPI for now. Will monitor for bloody stools. 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 pl   Critical Care Time: 31 minutes      The patient is critically ill due to respiratory failure, encephalopathy, electrolyte abnormalities.  Critical care was necessary to treat or prevent imminent or life-threatening deterioration.  Critical care was time spent personally by me on the following activities: development of treatment plan with patient and/or surrogate as well as nursing, discussions with consultants, evaluation of patient's response to treatment, examination of patient, obtaining history from patient or surrogate, ordering and performing treatments and interventions, ordering and review of laboratory studies, ordering and review of radiographic studies, pulse oximetry, re-evaluation of patient's condition and participation in multidisciplinary rounds.   Critical Care Time devoted to patient care services described in this note is 31 minutes. This time reflects time of care of this [place] . This critical care time does not reflect separately billable procedures or procedure time, teaching time or supervisory time of PA/NP/Med  student/Med Resident etc but could involve care discussion time.       Spero Geralds Martin Pulmonary and Critical Care Medicine 06/26/2021 11:11 AM  Pager: see AMION  If no response to pager , please call critical care on call (see AMION) until 7pm After 7:00 pm call Elink

## 2021-06-27 DIAGNOSIS — J398 Other specified diseases of upper respiratory tract: Secondary | ICD-10-CM | POA: Diagnosis not present

## 2021-06-27 DIAGNOSIS — N170 Acute kidney failure with tubular necrosis: Secondary | ICD-10-CM | POA: Diagnosis not present

## 2021-06-27 DIAGNOSIS — R0603 Acute respiratory distress: Secondary | ICD-10-CM | POA: Diagnosis not present

## 2021-06-27 DIAGNOSIS — A419 Sepsis, unspecified organism: Secondary | ICD-10-CM | POA: Diagnosis not present

## 2021-06-27 LAB — COMPREHENSIVE METABOLIC PANEL
ALT: 22 U/L (ref 0–44)
AST: 28 U/L (ref 15–41)
Albumin: 2.2 g/dL — ABNORMAL LOW (ref 3.5–5.0)
Alkaline Phosphatase: 61 U/L (ref 38–126)
Anion gap: 9 (ref 5–15)
BUN: 26 mg/dL — ABNORMAL HIGH (ref 8–23)
CO2: 20 mmol/L — ABNORMAL LOW (ref 22–32)
Calcium: 8.3 mg/dL — ABNORMAL LOW (ref 8.9–10.3)
Chloride: 110 mmol/L (ref 98–111)
Creatinine, Ser: 0.82 mg/dL (ref 0.44–1.00)
GFR, Estimated: >60 mL/min (ref >60)
Glucose, Bld: 97 mg/dL (ref 70–99)
Potassium: 3.2 mmol/L — ABNORMAL LOW (ref 3.5–5.1)
Sodium: 139 mmol/L (ref 135–145)
Total Bilirubin: 1.1 mg/dL (ref 0.3–1.2)
Total Protein: 5.3 g/dL — ABNORMAL LOW (ref 6.5–8.1)

## 2021-06-27 LAB — MRSA NEXT GEN BY PCR, NASAL: MRSA by PCR Next Gen: NOT DETECTED

## 2021-06-27 LAB — CBC
HCT: 27 % — ABNORMAL LOW (ref 36.0–46.0)
Hemoglobin: 8.7 g/dL — ABNORMAL LOW (ref 12.0–15.0)
MCH: 27.6 pg (ref 26.0–34.0)
MCHC: 32.2 g/dL (ref 30.0–36.0)
MCV: 85.7 fL (ref 80.0–100.0)
Platelets: 125 10*3/uL — ABNORMAL LOW (ref 150–400)
RBC: 3.15 MIL/uL — ABNORMAL LOW (ref 3.87–5.11)
RDW: 15.3 % (ref 11.5–15.5)
WBC: 17.8 10*3/uL — ABNORMAL HIGH (ref 4.0–10.5)
nRBC: 0.3 % — ABNORMAL HIGH (ref 0.0–0.2)

## 2021-06-27 LAB — MAGNESIUM: Magnesium: 1.7 mg/dL (ref 1.7–2.4)

## 2021-06-27 LAB — URINE CULTURE: Culture: >=100000 — AB

## 2021-06-27 LAB — GLUCOSE, CAPILLARY
Glucose-Capillary: 95 mg/dL (ref 70–99)
Glucose-Capillary: 86 mg/dL (ref 70–99)
Glucose-Capillary: 93 mg/dL (ref 70–99)
Glucose-Capillary: 91 mg/dL (ref 70–99)
Glucose-Capillary: 101 mg/dL — ABNORMAL HIGH (ref 70–99)
Glucose-Capillary: 98 mg/dL (ref 70–99)

## 2021-06-27 MED ORDER — MAGNESIUM SULFATE 4 GM/100ML IV SOLN
4.0000 g | Freq: Once | INTRAVENOUS | Status: AC
  Administered 2021-06-27: 4 g via INTRAVENOUS
  Filled 2021-06-27: qty 100

## 2021-06-27 MED ORDER — IPRATROPIUM-ALBUTEROL 0.5-2.5 (3) MG/3ML IN SOLN
RESPIRATORY_TRACT | Status: AC
  Administered 2021-06-27: 3 mL via RESPIRATORY_TRACT
  Filled 2021-06-27: qty 3

## 2021-06-27 MED ORDER — PANTOPRAZOLE 2 MG/ML SUSPENSION
40.0000 mg | Freq: Every day | ORAL | Status: DC
  Administered 2021-06-28 – 2021-07-03 (×6): 40 mg
  Filled 2021-06-27 (×6): qty 20

## 2021-06-27 MED ORDER — SODIUM CHLORIDE 0.9 % IV SOLN
3.0000 g | Freq: Four times a day (QID) | INTRAVENOUS | Status: AC
  Administered 2021-06-27 – 2021-06-29 (×10): 3 g via INTRAVENOUS
  Filled 2021-06-27 (×9): qty 8

## 2021-06-27 MED ORDER — IPRATROPIUM-ALBUTEROL 0.5-2.5 (3) MG/3ML IN SOLN: 3.0000 mL | Freq: Four times a day (QID) | RESPIRATORY_TRACT | Status: DC | PRN

## 2021-06-27 MED ORDER — LINEZOLID 600 MG PO TABS
600.0000 mg | ORAL_TABLET | Freq: Two times a day (BID) | ORAL | Status: DC
  Administered 2021-06-27 – 2021-07-03 (×13): 600 mg
  Filled 2021-06-27 (×14): qty 1

## 2021-06-27 MED ORDER — OSMOLITE 1.2 CAL PO LIQD
1000.0000 mL | ORAL | Status: DC
  Administered 2021-06-27: 1000 mL
  Filled 2021-06-27 (×2): qty 1000

## 2021-06-27 MED ORDER — POTASSIUM CHLORIDE 20 MEQ PO PACK
40.0000 meq | PACK | ORAL | Status: AC
  Administered 2021-06-27 (×2): 40 meq
  Filled 2021-06-27: qty 2

## 2021-06-27 MED ORDER — LINEZOLID 100 MG/5ML PO SUSR
600.0000 mg | Freq: Two times a day (BID) | ORAL | Status: DC
  Filled 2021-06-27 (×2): qty 30

## 2021-06-27 MED ORDER — APIXABAN 5 MG PO TABS
5.0000 mg | ORAL_TABLET | Freq: Two times a day (BID) | ORAL | Status: DC
  Administered 2021-06-27 – 2021-07-03 (×13): 5 mg
  Filled 2021-06-27 (×13): qty 1

## 2021-06-27 MED ORDER — SODIUM CHLORIDE 0.9 % IV SOLN: 1.5000 g | Freq: Four times a day (QID) | INTRAVENOUS | Status: DC

## 2021-06-27 NOTE — Plan of Care (Signed)

## 2021-06-27 NOTE — Progress Notes (Addendum)
NAME:  Jill Buchanan, MRN:  [id number], DOB:  23-Oct-1960, LOS: 2 ADMISSION DATE:  06/25/2021, CONSULTATION DATE: June 25, 2021 REFERRING MD: ED physician, CHIEF COMPLAINT: Hypoxia tachypnea and altered mental status  History of Present Illness:  61 year old F, SNF resident, who presented to [facility] ER with reports of poor intake and low oxygen saturations.   The patient lives in a SNF due to severe debility in the setting of multiple strokes.  She also has a hx of anemia, GIB, AF on anticoagulation.  In December 2022, she was admitted to the hospital and seen by Palliative Care and was a DNR (on current admission this was confirmed by nursing home paperwork and sister by phone).   At the SNF, she was noted to have poor intake, concern for dehydration, tachypnea and low oxygen saturations.  She was suctioned several times and placed on oxygen.  There were concerns she had not taken in fluids in several days.  Initially staff were unable to reach daughter [facility]) but sister confirmed she was DNR on phone per staff.  Initial work up notable for Na 155, K 5.5, Cl 128, glucose 310, BUN 110, Cr 1.90, WBC 19.8, Hgb 6.8 and platelets 240.  She was treated with IVF, 2 units PRBC. She was febrile with CXR concerning for LUL aspiration PNA. Initial lactic acid 5.9 which cleared to 2.7 after fluid resuscitation.  Foley catheter was placed in the ER.   PCCM called for evaluation.    Daughter reports pt was standing at SNF with 1 assist.  Had been tolerating spoonfuls of water / thickened liquids.  She had difficulties with pocketing food per daughter and diet was changed.  There were concerns for aspiration at the facility.  She has been TF dependent.   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  Femoral central line placed in ER.     Subjective:  This  morning had nebulizer treatment for wheezing.   Objective   Blood pressure (!) 97/56, pulse 61, temperature 99.1 F (37.3 C), resp. rate (!) 27, weight 48.6 kg, SpO2 100 %.        Intake/Output Summary (Last 24 hours) at 06/27/2021 1250 Last data filed at 06/27/2021 1100 Gross per 24 hour  Intake 1996.48 ml  Output 2050 ml  Net -53.52 ml   Filed Weights   06/25/21 0336 06/26/21 0500  Weight: 51.1 kg 48.6 kg    Examination: Chronically ill appearing,  No respiratory distress Opaque corneas Thin extremities No edema RRR no mrg G tube in place  Hgb 8.7 K 3.2 Mg 1.7 Na 139 CBGs 90s  Assessment & Plan:   Severe Sepsis  Dehydration Failure to thrive Profound volume depletion with anemia. New LUL infiltrate. S/p IVF + 2 units PRBC in ER.  - likely all secondary to aspiration pneumonia (complete 5 days of abx with unasyn) - also has history of VRE UTI. Compete 7 days of abx with linezolid - abx as below  Left sided PNA, evolving RUL infiltrates Acute Hypoxic Respiratory Failure  -cefepime / vancomycin per pharmacy empirically initially Will transition to linezolid and unasyn to cover PNA and UTI. Total 7 day course.  -wean O2 for sats >90% -NTS PRN  -aspiration precautions, may need SLP once more stable   Hypoglycemia - D5 started 1/28. Decreased to  10 ml/hr today - initiate tube feeds today  Hypernatremia, Hypochloremia  Hypokalemia Hypomagnesemia AKI  In setting of profound volume depletion, anemia  -Trend BMP / urinary output -continue D5 for hypoglycemia, decrease rate since hypernatremia has improved . - will initiate tube feeds. Continue free H20 flushes -Replace electrolytes as indicated, enteral K replacement -Avoid nephrotoxic agents, ensure adequate renal perfusion  Anemia  Query acute on chronic blood loss. No clear source of bleeding, hx of prior GIB, suspect low volume chronic GI loss superimposed on chronic disease.  - hgb stable at 8.7 after  transfusions - initiate tube feeds. On BID PPI for now. Can probably de-escalate to per tube daily  AF on Chronic Anticoagulation HTN, HLD  CAD s/p CABG  -tele monitoring  -hold home anticoagulation given anemia / transfusions -hold home norvasc, coreg, lipitor  - resume eliquis  Hx CVA, Acute on Chronic Encephalopathy  -EEG shows toxic metabolic etiology.   -CT head not done. She seems to be gradually improving for now  Congenital Blindness  -supportive care   [place] Adult Failure to Thrive  Severe Protein Calorie Malnutrition  Goals of care conversation had at bedside with patient's daughter and sister at the bedside on 1/27. plan is to maintain supportive care. [place] with short trial of intubation until family can arrive to say goodbye. Their ultimate goal is to get her home and consider outpatient hospice facilities. Palliative care has been engaged.   She is on less oxygen now, down from 10LPM to 6LPM. She is appropriate for transfer out of the ICU. [facility] to assume care, PCCM will see as needed.  Lenice Llamas, MD Pulmonary and [place] 06/27/2021 1:02 PM Pager: see AMION  If no response to pager, please call critical care on call (see AMION) until 7pm After 7:00 pm call Elink

## 2021-06-27 NOTE — Plan of Care (Signed)
  Problem: Clinical Measurements: Goal: Respiratory complications will improve Outcome: Progressing   

## 2021-06-28 DIAGNOSIS — J9601 Acute respiratory failure with hypoxia: Secondary | ICD-10-CM | POA: Diagnosis not present

## 2021-06-28 DIAGNOSIS — I2510 Atherosclerotic heart disease of native coronary artery without angina pectoris: Secondary | ICD-10-CM

## 2021-06-28 DIAGNOSIS — J690 Pneumonitis due to inhalation of food and vomit: Secondary | ICD-10-CM | POA: Diagnosis not present

## 2021-06-28 DIAGNOSIS — L8990 Pressure ulcer of unspecified site, unspecified stage: Secondary | ICD-10-CM | POA: Insufficient documentation

## 2021-06-28 LAB — GLUCOSE, CAPILLARY
Glucose-Capillary: 110 mg/dL — ABNORMAL HIGH (ref 70–99)
Glucose-Capillary: 122 mg/dL — ABNORMAL HIGH (ref 70–99)
Glucose-Capillary: 128 mg/dL — ABNORMAL HIGH (ref 70–99)
Glucose-Capillary: 130 mg/dL — ABNORMAL HIGH (ref 70–99)
Glucose-Capillary: 144 mg/dL — ABNORMAL HIGH (ref 70–99)
Glucose-Capillary: 160 mg/dL — ABNORMAL HIGH (ref 70–99)

## 2021-06-28 MED ORDER — ADULT MULTIVITAMIN W/MINERALS CH
1.0000 | ORAL_TABLET | Freq: Every day | ORAL | Status: DC
  Administered 2021-06-28 – 2021-07-03 (×6): 1
  Filled 2021-06-28 (×6): qty 1

## 2021-06-28 MED ORDER — ONDANSETRON HCL 4 MG/2ML IJ SOLN: 4.0000 mg | Freq: Four times a day (QID) | INTRAMUSCULAR | Status: DC | PRN

## 2021-06-28 MED ORDER — ONDANSETRON HCL 4 MG PO TABS: 4.0000 mg | ORAL_TABLET | Freq: Four times a day (QID) | ORAL | Status: DC | PRN

## 2021-06-28 MED ORDER — ACETAMINOPHEN 650 MG RE SUPP: 650.0000 mg | Freq: Four times a day (QID) | RECTAL | Status: DC | PRN

## 2021-06-28 MED ORDER — ACETAMINOPHEN 325 MG PO TABS: 650.0000 mg | ORAL_TABLET | Freq: Four times a day (QID) | ORAL | Status: DC | PRN

## 2021-06-28 MED ORDER — OSMOLITE 1.2 CAL PO LIQD
1000.0000 mL | ORAL | Status: DC
  Administered 2021-06-28 – 2021-07-01 (×3): 1000 mL
  Filled 2021-06-28 (×8): qty 1000

## 2021-06-28 MED ORDER — PROSOURCE TF PO LIQD
45.0000 mL | Freq: Every day | ORAL | Status: DC
  Administered 2021-06-28 – 2021-07-03 (×6): 45 mL
  Filled 2021-06-28 (×6): qty 45

## 2021-06-28 MED ORDER — SODIUM CHLORIDE 3 % IN NEBU
4.0000 mL | INHALATION_SOLUTION | Freq: Two times a day (BID) | RESPIRATORY_TRACT | Status: AC
  Administered 2021-06-28 – 2021-06-30 (×6): 4 mL via RESPIRATORY_TRACT
  Filled 2021-06-28 (×6): qty 4

## 2021-06-28 MED ORDER — CARVEDILOL 25 MG PO TABS
25.0000 mg | ORAL_TABLET | Freq: Two times a day (BID) | ORAL | Status: DC
  Administered 2021-06-28 – 2021-07-03 (×9): 25 mg
  Filled 2021-06-28 (×10): qty 1

## 2021-06-28 NOTE — Progress Notes (Signed)
NAME:  Jill Buchanan, MRN:  [id number], DOB:  [birth year], LOS: 3 ADMISSION DATE:  06/25/2021, CONSULTATION DATE: June 25, 2021 REFERRING MD: ED physician, CHIEF COMPLAINT: Hypoxia tachypnea and altered mental status  History of Present Illness:  61 year old F, SNF resident, who presented to [facility] ER with reports of poor intake and low oxygen saturations.   The patient lives in a SNF due to severe debility in the setting of multiple strokes.  She also has a hx of anemia, GIB, AF on anticoagulation.  In December 2022, she was admitted to the hospital and seen by Palliative Care and was a DNR (on current admission this was confirmed by nursing home paperwork and sister by phone).   At the SNF, she was noted to have poor intake, concern for dehydration, tachypnea and low oxygen saturations.  She was suctioned several times and placed on oxygen.  There were concerns she had not taken in fluids in several days.  Initially staff were unable to reach daughter [facility]) but sister confirmed she was DNR on phone per staff.  Initial work up notable for Na 155, K 5.5, Cl 128, glucose 310, BUN 110, Cr 1.90, WBC 19.8, Hgb 6.8 and platelets 240.  She was treated with IVF, 2 units PRBC. She was febrile with CXR concerning for LUL aspiration PNA. Initial lactic acid 5.9 which cleared to 2.7 after fluid resuscitation.  Foley catheter was placed in the ER.   PCCM called for evaluation.    Daughter reports pt was standing at SNF with 1 assist.  Had been tolerating spoonfuls of water / thickened liquids.  She had difficulties with pocketing food per daughter and diet was changed.  There were concerns for aspiration at the facility.  She has been TF dependent.   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  Femoral central line placed in ER.  Subjective:  No acute  events overnight  Objective   Blood pressure (!) 139/56, pulse (!) 46, temperature 99.5 F (37.5 C), resp. rate 19, weight 48.6 kg, SpO2 97 %.        Intake/Output Summary (Last 24 hours) at 06/28/2021 1125 Last data filed at 06/28/2021 1005 Gross per 24 hour  Intake 2954.42 ml  Output 2610 ml  Net 344.42 ml   Filed Weights   06/25/21 0336 06/26/21 0500  Weight: 51.1 kg 48.6 kg    Examination: General: Chronically-ill, frail appearing elderly woman.  Head: Blind with opaque corneas.  CV: RRR. No murmurs, rubs, or gallops. No LE edema Pulmonary: Normal effort. Some scattered rhonchi.  Abdomen: G-tube in place.  Extremities: Warm. Well-performed Skin: Warm and dry. No obvious rash or lesions. Neuro: Awake. Does not follow commands. Moves all extremities.   CBGs 144, 110  Assessment & Plan:  Left sided PNA, evolving RUL infiltrates Acute Hypoxic Respiratory Failure  Respiratory status remain stable at the moment. On HFNC 3 L, High risk of needing intubation in the setting of her chronic respiratory infections and deconditioning. Leukocytosis has not completely resolved and continues to have low grade fevers.  --Continue linezolid and Unasyn --Chest PT --Duonebs --Trend CBC, fever curve --Wean O2 for sats >90%  --Aspiration precautions  GOC Adult Failure to Thrive  Severe Protein Calorie Malnutrition  Goals of care conversation had at bedside with patient's daughter and sister at  the bedside on 1/27. plan is to maintain supportive care. [place] with short trial of intubation until family can arrive to say goodbye. Their ultimate goal is to get her home and consider outpatient hospice facilities. Palliative care has been engaged.

## 2021-06-28 NOTE — Progress Notes (Signed)
Progress note:  After reviewing the patient's chart I assessed the patient at bedside.  She is resting in no apparent distress.  Her tube feeds are running.  She does not acknowledge my presence.  However, with my previous visits, patient has had significant responses to voice of only family members.  No family present at bedside currently.  As per chart review, patient appears to be stable for transfer to stepdown unit.  PMT will continue to follow the patient throughout her hospitalization.   Samara Deist L. Manon Hilding, FNP-BC Palliative Medicine Team Team Phone # [phone]  NO CHARGE

## 2021-06-28 NOTE — Progress Notes (Signed)
Initial Nutrition Assessment  DOCUMENTATION CODES:   Non-severe (moderate) malnutrition in context of acute illness/injury  INTERVENTION:   Continue tube feeding via PEG: Osmolite 1.2 at 55 ml/h (1320 ml per day) Prosource TF 45 ml once daily  Provides 1624 kcal, 84 gm protein, 1082 ml free water daily.  Free water flushes 300 ml every 4 hours for a total of 2882 ml free water daily.  MVI with minerals via tube daily.  NUTRITION DIAGNOSIS:   Moderate Malnutrition related to acute illness (COVID-19 in December 2022) as evidenced by mild fat depletion, moderate fat depletion, mild muscle depletion, moderate muscle depletion.  GOAL:   Patient will meet greater than or equal to 90% of their needs  MONITOR:   Labs, TF tolerance, Skin  REASON FOR ASSESSMENT:   Consult Enteral/tube feeding initiation and management  ASSESSMENT:   61 yo female admitted with severe sepsis, dehydration, FTT, PNA. PMH includes multiple strokes, anemia, A fib, CAD, AVR, PEG.  Discussed patient in ICU rounds and with RN today. Currently on 3L HFNC. Palliative care team is following.  Patient has a PEG and TF (Osmolite 1.2) is currently infusing at 50 ml/h. Free water flushes 300 ml every 4 hours.  Unsure of usual TF regimen.  Usual weights reviewed. Weight is down by 5% over the past 1.5 months, likely at least partially r/t dehydration on admission.   Labs reviewed. K 3.2 (1/29) CBG: [id number]  Medications reviewed and include Novolog.  Patient was identified to have moderate malnutrition in December 2022 when she was admitted with COVID-19. This is ongoing with mild-moderate depletion of muscle and subcutaneous fat mass.  NUTRITION - FOCUSED PHYSICAL EXAM:  Flowsheet Row Most Recent Value  Orbital Region Moderate depletion  Upper Arm Region Mild depletion  Thoracic and Lumbar Region Moderate depletion  Buccal Region Mild depletion  Temple Region Moderate depletion  Clavicle Bone  Region Moderate depletion  Clavicle and Acromion Bone Region Moderate depletion  Scapular Bone Region Moderate depletion  Dorsal Hand Moderate depletion  Patellar Region Severe depletion  Anterior Thigh Region Severe depletion  Posterior Calf Region Mild depletion  Edema (RD Assessment) None  Hair Reviewed  Eyes Unable to assess  Mouth Reviewed  Skin Reviewed  Nails Reviewed       Diet Order:   Diet Order             Diet NPO time specified  Diet effective now                   EDUCATION NEEDS:   No education needs have been identified at this time  Skin:  Skin Assessment: Skin Integrity Issues: Skin Integrity Issues:: Stage I Stage I: buttocks  Last BM:  1/30  Height:   Ht Readings from Last 1 Encounters:  04/28/21 5\' 3"  (1.6 m)    Weight:   Wt Readings from Last 1 Encounters:  06/26/21 48.6 kg    BMI:  Body mass index is 18.98 kg/m.  Estimated Nutritional Needs:   Kcal:  1500-1700  Protein:  75-85 gm  Fluid:  >/= 1.5 L   Lucas Mallow RD, LDN, CNSC Please refer to Amion for contact information.

## 2021-06-28 NOTE — Progress Notes (Signed)
PROGRESS NOTE    Jill Buchanan  [id number] DOB: 1961-05-13 DOA: 06/25/2021 PCP: Pcp, No   Brief Narrative:  61 year old F, SNF resident with history of multiple strokes, afib on anticoagulation, CAD s/p distant CABG, AVR, Anemia and previous GI bleeds. Pt presented to [facility] ER with reports of poor intake and low oxygen saturations. The patient lives in a SNF due to severe debility in the setting of multiple strokes.  She also has a hx of anemia, GIB, AF on anticoagulation.  In December 2022, she was admitted to the hospital and seen by Palliative Care and was a DNR (on current admission this was confirmed by nursing home paperwork and sister by phone).   Assessment & Plan:   Severe sepsis secondary to left upper lobe HCAP, POA Cannot rule out concurrent UTI Acute hypoxic respiratory failure - Continue linezolid and unasyn to cover PNA and UTI for 7 day course.  - Wean O2 for sats >90% -Continue aspiration precautions, chronically unable to tolerate p.o., continue PEG tube feedings as below  Dehydration Failure to thrive - Profound volume depletion with anemia likely exacerbated by above illness.  -Status post IVF    Acute(unspecified) on chronic anemia of chronic disease Symptomatic anemia -Unclear etiology, status post 2 unit PRBC at intake  -History of GI bleed continues on anticoagulation but no signs or symptoms of blood loss at this time -Hemoglobin stable posttransfusion -Likely chronic anemia exacerbated by acute illness as above  AF on Chronic Anticoagulation HTN, HLD  CAD s/p CABG  -Holding home blood pressure medications in the setting of hypotension due to severe sepsis(shock ruled out) -hold home norvasc, coreg, lipitor  -Continue eliquis  Hypoglycemia - D5 started 1/28. Decreased to 10 ml/hr today -Resume tube feeds, dietary/nutrition following   Hypernatremia, Hypochloremia  Hypokalemia Hypomagnesemia AKI  -In the setting of volume depletion and  symptomatic anemia, resolving with transfusion and IV fluids as well as reinitiation of tube feeds    Hx CVA, Acute on Chronic Encephalopathy  -EEG shows toxic metabolic etiology.   -Mental status appears to be approaching baseline per chart review   Congenital Blindness  -supportive care    [place] Adult Failure to Thrive  Severe Protein Calorie Malnutrition  Per previous discussion with ICU family's ultimate goal is to get her home and consider outpatient hospice facilities. Palliative care has been engaged.    DVT prophylaxis: Eliquis Code Status: Partial**Intubation only** - No CPR, no noninvasive ventilation, no cardioversion and no ACLS medications Family Communication: None present  Status is: Inpatient  Dispo: The patient is from: SNF, [facility]              Anticipated d/c is to: To be determined              Anticipated d/c date is: 48 to 72 hours              Patient currently not medically stable for discharge  Consultants:  PCCM, palliative care  Procedures:  None  Antimicrobials:  Linezolid, Unasyn  Subjective: No acute issues or events overnight per documentation, patient remains poorly interactive  Objective: Vitals:   06/28/21 0200 06/28/21 0300 06/28/21 0400 06/28/21 0500  BP: (!) 105/51 (!) 103/57 (!) 131/57 (!) 139/56  Pulse: (!) 46 (!) 48 (!) 58 (!) 46  Resp: (!) 23 (!) 22 19 19   Temp: 99.3 F (37.4 C) 99.3 F (37.4 C) 99.1 F (37.3 C) 99.5 F (37.5 C)  TempSrc:   Bladder  SpO2: 99% 99% 96% 97%  Weight:        Intake/Output Summary (Last 24 hours) at 06/28/2021 0739 Last data filed at 06/28/2021 0600 Gross per 24 hour  Intake 3759.37 ml  Output 3035 ml  Net 724.37 ml   Filed Weights   06/25/21 0336 06/26/21 0500  Weight: 51.1 kg 48.6 kg    Examination:  General:  Pleasantly resting in bed, No acute distress.  Poorly interactive Neck:  Without mass or deformity.  Trachea is midline. Lungs:  Clear to auscultate bilaterally without  rhonchi, wheeze, or rales. Heart:  Regular rate and rhythm.  Without murmurs, rubs, or gallops. Abdomen:  Soft, nontender, nondistended.  Without guarding or rebound.  PEG tube present Skin: Sacral decubitus ulcer.  Data Reviewed: I have personally reviewed following labs and imaging studies  CBC: Recent Labs  Lab 06/25/21 0329 06/25/21 0405 06/25/21 0413 06/25/21 1820 06/26/21 0351 06/27/21 0540  WBC 20.7* 19.8*  --  21.1* 20.0* 17.8*  NEUTROABS 19.5* 17.8*  --  18.6*  --   --   HGB 9.6* 6.8* 7.8* 9.3* 9.1* 8.7*  HCT 30.7* 25.0* 23.0* 28.3* 27.7* 27.0*  MCV 87.7 97.7  --  86.5 85.5 85.7  PLT 161 240  --  160 140* 0000000*   Basic Metabolic Panel: Recent Labs  Lab 06/25/21 0405 06/25/21 0413 06/26/21 0351 06/26/21 0900 06/27/21 0540  NA 154* 155* 154*  --  139  K 4.4 5.5* 3.1*  --  3.2*  CL 129* 128* 124*  --  110  CO2 16*  --  21*  --  20*  GLUCOSE 278* 310* 112*  --  97  BUN 84* 110* 45*  --  26*  CREATININE 1.79* 1.90* 1.00  --  0.82  CALCIUM 7.5*  --  8.7*  --  8.3*  MG  --   --   --  2.0 1.7   GFR: Estimated Creatinine Clearance: 54.6 mL/min (by C-G formula based on SCr of 0.82 mg/dL). Liver Function Tests: Recent Labs  Lab 06/25/21 0405 06/27/21 0540  AST 26 28  ALT 17 22  ALKPHOS 47 61  BILITOT 0.4 1.1  PROT 5.2* 5.3*  ALBUMIN 2.2* 2.2*   No results for input(s): LIPASE, AMYLASE in the last 168 hours. No results for input(s): AMMONIA in the last 168 hours. Coagulation Profile: Recent Labs  Lab 06/25/21 0405  INR 2.0*   Cardiac Enzymes: No results for input(s): CKTOTAL, CKMB, CKMBINDEX, TROPONINI in the last 168 hours. BNP (last 3 results) No results for input(s): PROBNP in the last 8760 hours. HbA1C: No results for input(s): HGBA1C in the last 72 hours. CBG: Recent Labs  Lab 06/27/21 1559 06/27/21 1948 06/27/21 2329 06/28/21 0315 06/28/21 0736  GLUCAP 91 101* 98 110* 144*   Lipid Profile: No results for input(s): CHOL, HDL, LDLCALC,  TRIG, CHOLHDL, LDLDIRECT in the last 72 hours. Thyroid Function Tests: No results for input(s): TSH, T4TOTAL, FREET4, T3FREE, THYROIDAB in the last 72 hours. Anemia Panel: No results for input(s): VITAMINB12, FOLATE, FERRITIN, TIBC, IRON, RETICCTPCT in the last 72 hours. Sepsis Labs: Recent Labs  Lab 06/25/21 0405 06/25/21 0630 06/25/21 1431 06/25/21 1505  PROCALCITON  --   --   --  22.01  LATICACIDVEN 5.9* 2.7* 4.6*  --     Recent Results (from the past 240 hour(s))  Resp Panel by RT-PCR (Flu A&B, Covid) Nasopharyngeal Swab     Status: None   Collection Time: 06/25/21  3:37 AM  Specimen: Nasopharyngeal Swab; Nasopharyngeal(NP) swabs in vial transport medium  Result Value Ref Range Status   SARS Coronavirus 2 by RT PCR NEGATIVE NEGATIVE Final    Comment: (NOTE) SARS-CoV-2 target nucleic acids are NOT DETECTED.  The SARS-CoV-2 RNA is generally detectable in upper respiratory specimens during the acute phase of infection. The lowest concentration of SARS-CoV-2 viral copies this assay can detect is 138 copies/mL. A negative result does not preclude SARS-Cov-2 infection and should not be used as the sole basis for treatment or other patient management decisions. A negative result may occur with  improper specimen collection/handling, submission of specimen other than nasopharyngeal swab, presence of viral mutation(s) within the areas targeted by this assay, and inadequate number of viral copies(<138 copies/mL). A negative result must be combined with clinical observations, patient history, and epidemiological information. The expected result is Negative.  Fact Sheet for Patients:  EntrepreneurPulse.com.au  Fact Sheet for Healthcare Providers:  IncredibleEmployment.be  This test is no t yet approved or cleared by the [place] FDA and  has been authorized for detection and/or diagnosis of SARS-CoV-2 by FDA under an Emergency Use  Authorization (EUA). This EUA will remain  in effect (meaning this test can be used) for the duration of the COVID-19 declaration under Section 564(b)(1) of the Act, 21 U.S.C.section 360bbb-3(b)(1), unless the authorization is terminated  or revoked sooner.       Influenza A by PCR NEGATIVE NEGATIVE Final   Influenza B by PCR NEGATIVE NEGATIVE Final    Comment: (NOTE) The Xpert Xpress SARS-CoV-2/FLU/RSV plus assay is intended as an aid in the diagnosis of influenza from Nasopharyngeal swab specimens and should not be used as a sole basis for treatment. Nasal washings and aspirates are unacceptable for Xpert Xpress SARS-CoV-2/FLU/RSV testing.  Fact Sheet for Patients: EntrepreneurPulse.com.au  Fact Sheet for Healthcare Providers: IncredibleEmployment.be  This test is not yet approved or cleared by the [place] FDA and has been authorized for detection and/or diagnosis of SARS-CoV-2 by FDA under an Emergency Use Authorization (EUA). This EUA will remain in effect (meaning this test can be used) for the duration of the COVID-19 declaration under Section 564(b)(1) of the Act, 21 U.S.C. section 360bbb-3(b)(1), unless the authorization is terminated or revoked.  Performed at [facility], [place] [address] [place], [place] [address]   Blood Culture (routine x 2)     Status: None (Preliminary result)   Collection Time: 06/25/21  3:37 AM   Specimen: BLOOD RIGHT ARM  Result Value Ref Range Status   Specimen Description BLOOD RIGHT ARM  Final   Special Requests   Final    AEROBIC BOTTLE ONLY Blood Culture results may not be optimal due to an inadequate volume of blood received in culture bottles   Culture   Final    NO GROWTH 2 DAYS Performed at [facility], [place] [address], [place], [place] [address]    Report Status PENDING  Incomplete  Blood Culture (routine x 2)     Status: None (Preliminary result)   Collection Time: 06/25/21   3:42 AM   Specimen: BLOOD LEFT ARM  Result Value Ref Range Status   Specimen Description BLOOD LEFT ARM  Final   Special Requests   Final    BOTTLES DRAWN AEROBIC AND ANAEROBIC Blood Culture adequate volume   Culture   Final    NO GROWTH 2 DAYS Performed at [facility], [place] [address] [place], [place] [address]  Report Status PENDING  Incomplete  Urine Culture     Status: Abnormal   Collection Time: 06/25/21  8:38 AM   Specimen: In/Out Cath Urine  Result Value Ref Range Status   Specimen Description IN/OUT CATH URINE  Final   Special Requests   Final    NONE Performed at [facility], [address] [place], [place] [address]    Culture (A)  Final    >=100,000 COLONIES/mL VANCOMYCIN RESISTANT ENTEROCOCCUS   Report Status 06/27/2021 FINAL  Final   Organism ID, Bacteria VANCOMYCIN RESISTANT ENTEROCOCCUS (A)  Final      Susceptibility   Vancomycin resistant enterococcus - MIC*    AMPICILLIN >=32 RESISTANT Resistant     NITROFURANTOIN 128 RESISTANT Resistant     VANCOMYCIN >=32 RESISTANT Resistant     LINEZOLID 2 SENSITIVE Sensitive     * >=100,000 COLONIES/mL VANCOMYCIN RESISTANT ENTEROCOCCUS  MRSA Next Gen by PCR, Nasal     Status: None   Collection Time: 06/27/21  8:45 PM   Specimen: Nasal Mucosa; Nasal Swab  Result Value Ref Range Status   MRSA by PCR Next Gen NOT DETECTED NOT DETECTED Final    Comment: (NOTE) The GeneXpert MRSA Assay (FDA approved for NASAL specimens only), is one component of a comprehensive MRSA colonization surveillance program. It is not intended to diagnose MRSA infection nor to guide or monitor treatment for MRSA infections. Test performance is not FDA approved in patients less than 30 years old. Performed at [facility], [place] [address], [place], [place] [address]          Radiology Studies: No results found.      Scheduled Meds:  apixaban  5 mg Per Tube BID   chlorhexidine  15 mL Mouth Rinse BID    Chlorhexidine Gluconate Cloth  6 each Topical Daily   free water  300 mL Per Tube Q4H   insulin aspart  0-9 Units Subcutaneous Q4H   linezolid  600 mg Per Tube Q12H   mouth rinse  15 mL Mouth Rinse q12n4p   pantoprazole sodium  40 mg Per Tube Daily   sodium chloride flush  3 mL Intravenous Q12H   Continuous Infusions:  sodium chloride Stopped (06/26/21 0645)   ampicillin-sulbactam (UNASYN) IV Stopped (06/28/21 0241)   dextrose 10 mL/hr at 06/28/21 0400   feeding supplement (OSMOLITE 1.2 CAL) 50 mL/hr at 06/28/21 0600    LOS: 3 days   Time spent: 103min  Chiyo Fay C Addaline Peplinski, DO [facility]  If 7PM-7AM, please contact night-coverage [email or web address]  06/28/2021, 7:39 AM

## 2021-06-29 LAB — CBC
HCT: 31.3 % — ABNORMAL LOW (ref 36.0–46.0)
Hemoglobin: 9.8 g/dL — ABNORMAL LOW (ref 12.0–15.0)
MCH: 26.7 pg (ref 26.0–34.0)
MCHC: 31.3 g/dL (ref 30.0–36.0)
MCV: 85.3 fL (ref 80.0–100.0)
Platelets: 121 10*3/uL — ABNORMAL LOW (ref 150–400)
RBC: 3.67 MIL/uL — ABNORMAL LOW (ref 3.87–5.11)
RDW: 14.9 % (ref 11.5–15.5)
WBC: 14.3 10*3/uL — ABNORMAL HIGH (ref 4.0–10.5)
nRBC: 0 % (ref 0.0–0.2)

## 2021-06-29 LAB — BASIC METABOLIC PANEL
Anion gap: 12 (ref 5–15)
BUN: 13 mg/dL (ref 8–23)
CO2: 22 mmol/L (ref 22–32)
Calcium: 8.6 mg/dL — ABNORMAL LOW (ref 8.9–10.3)
Chloride: 107 mmol/L (ref 98–111)
Creatinine, Ser: 0.85 mg/dL (ref 0.44–1.00)
GFR, Estimated: >60 mL/min (ref >60)
Glucose, Bld: 106 mg/dL — ABNORMAL HIGH (ref 70–99)
Potassium: 3.3 mmol/L — ABNORMAL LOW (ref 3.5–5.1)
Sodium: 141 mmol/L (ref 135–145)

## 2021-06-29 LAB — GLUCOSE, CAPILLARY
Glucose-Capillary: 109 mg/dL — ABNORMAL HIGH (ref 70–99)
Glucose-Capillary: 129 mg/dL — ABNORMAL HIGH (ref 70–99)
Glucose-Capillary: 131 mg/dL — ABNORMAL HIGH (ref 70–99)
Glucose-Capillary: 110 mg/dL — ABNORMAL HIGH (ref 70–99)
Glucose-Capillary: 124 mg/dL — ABNORMAL HIGH (ref 70–99)
Glucose-Capillary: 126 mg/dL — ABNORMAL HIGH (ref 70–99)

## 2021-06-29 MED ORDER — POTASSIUM CHLORIDE 20 MEQ PO PACK
40.0000 meq | PACK | Freq: Once | ORAL | Status: AC
  Administered 2021-06-29: 40 meq
  Filled 2021-06-29: qty 2

## 2021-06-29 MED ORDER — POTASSIUM CHLORIDE CRYS ER 20 MEQ PO TBCR: 40.0000 meq | EXTENDED_RELEASE_TABLET | Freq: Once | ORAL | Status: DC

## 2021-06-29 NOTE — Progress Notes (Signed)
PROGRESS NOTE    Jill Buchanan  [id number] DOB: 09-27-1960 DOA: 06/25/2021 PCP: Pcp, No   Brief Narrative:  61 year old F, SNF resident with history of multiple strokes, afib on anticoagulation, CAD s/p distant CABG, AVR, Anemia and previous GI bleeds. Pt presented to [facility] ER with reports of poor intake and low oxygen saturations. The patient lives in a SNF due to severe debility in the setting of multiple strokes.  She also has a hx of anemia, GIB, AF on anticoagulation. In December 2022, she was admitted to the hospital and seen by Palliative Care and was a DNR (on current admission this was confirmed by nursing home paperwork and sister by phone).  Assessment & Plan:   Severe sepsis secondary to left upper lobe HCAP, POA Cannot rule out concurrent UTI Acute hypoxic respiratory failure - Continue linezolid and unasyn to cover PNA and UTI for 7 day course through 07/03/21.  - Wean O2 for sats >90% - Continue aspiration precautions, chronically unable to tolerate p.o., continue PEG tube feedings as below  Dehydration Failure to thrive - Profound volume depletion with anemia likely exacerbated by above illness.  - Status post IVF   Acute (unspecified) on chronic anemia of chronic disease Symptomatic anemia - Unclear etiology, status post 2 unit PRBC at intake  - History of GI bleed continues on anticoagulation but no signs or symptoms of blood loss at this time - Hemoglobin stable post-transfusion - Likely chronic anemia exacerbated by acute illness as above  AFib on Chronic Anticoagulation HTN, HLD CAD s/p CABG - Holding home blood pressure medications in the setting of hypotension due to severe sepsis(shock ruled out) - hold home norvasc, coreg, lipitor  - Continues to have EKG changes consistent with PACs in the setting of above - Continue eliquis  Hypoglycemia - D5 started 1/28. Continue low rate - tube feeds ongoing - Transition off IVF  Hypernatremia, Hypochloremia   Hypokalemia Hypomagnesemia AKI  - In the setting of volume depletion and symptomatic anemia, resolving with transfusion and IV fluids as well as reinitiation of tube feeds   Hx CVA, Acute on Chronic Encephalopathy  - EEG shows toxic metabolic etiology.   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 Palliative care has been engaged  DVT prophylaxis: Eliquis Code Status: Partial **Intubation only** - No CPR, no noninvasive ventilation, no cardioversion and no ACLS medications Family Communication: Daughter updated over the phone  Status is: Inpatient  Dispo: The patient is from: SNF, [facility]              Anticipated d/c is to: To be determined              Anticipated d/c date is: 48 to 72 hours              Patient currently not medically stable for discharge  Consultants:  PCCM, palliative care  Procedures:  None  Antimicrobials:  Linezolid, Unasyn  Subjective: No acute issues or events overnight per documentation, patient remains poorly interactive  Objective: Vitals:   06/29/21 0100 06/29/21 0300 06/29/21 0500 06/29/21 0700  BP: 100/63 121/72 (!) 131/56   Pulse: (!) 58 77 71 70  Resp: 16 20 (!) 25 (!) 26  Temp:  98.3 F (36.8  C)    TempSrc:  Axillary    SpO2: 97% 97% 95% 96%  Weight:        Intake/Output Summary (Last 24 hours) at 06/29/2021 0715 Last data filed at 06/29/2021 0600 Gross per 24 hour  Intake 3073.45 ml  Output 694 ml  Net 2379.45 ml    Filed Weights   06/25/21 0336 06/26/21 0500  Weight: 51.1 kg 48.6 kg    Examination:  General:  Pleasantly resting in bed, No acute distress.  Poorly interactive Neck:  Without mass or deformity.  Trachea is midline. Lungs:  Clear to auscultate  bilaterally without rhonchi, wheeze, or rales. Heart:  Regular rate and rhythm.  Without murmurs, rubs, or gallops. Abdomen:  Soft, nontender, nondistended.  Without guarding or rebound.  PEG tube present Skin: Sacral decubitus ulcer.  Data Reviewed: I have personally reviewed following labs and imaging studies  CBC: Recent Labs  Lab 06/25/21 0329 06/25/21 0405 06/25/21 0413 06/25/21 1820 06/26/21 0351 06/27/21 0540 06/29/21 0620  WBC 20.7* 19.8*  --  21.1* 20.0* 17.8* 14.3*  NEUTROABS 19.5* 17.8*  --  18.6*  --   --   --   HGB 9.6* 6.8* 7.8* 9.3* 9.1* 8.7* 9.8*  HCT 30.7* 25.0* 23.0* 28.3* 27.7* 27.0* 31.3*  MCV 87.7 97.7  --  86.5 85.5 85.7 85.3  PLT 161 240  --  160 140* 125* 121*    Basic Metabolic Panel: Recent Labs  Lab 06/25/21 0405 06/25/21 0413 06/26/21 0351 06/26/21 0900 06/27/21 0540 06/29/21 0620  NA 154* 155* 154*  --  139 141  K 4.4 5.5* 3.1*  --  3.2* 3.3*  CL 129* 128* 124*  --  110 107  CO2 16*  --  21*  --  20* 22  GLUCOSE 278* 310* 112*  --  97 106*  BUN 84* 110* 45*  --  26* 13  CREATININE 1.79* 1.90* 1.00  --  0.82 0.85  CALCIUM 7.5*  --  8.7*  --  8.3* 8.6*  MG  --   --   --  2.0 1.7  --     GFR: Estimated Creatinine Clearance: 52.7 mL/min (by C-G formula based on SCr of 0.85 mg/dL). Liver Function Tests: Recent Labs  Lab 06/25/21 0405 06/27/21 0540  AST 26 28  ALT 17 22  ALKPHOS 47 61  BILITOT 0.4 1.1  PROT 5.2* 5.3*  ALBUMIN 2.2* 2.2*    No results for input(s): LIPASE, AMYLASE in the last 168 hours. No results for input(s): AMMONIA in the last 168 hours. Coagulation Profile: Recent Labs  Lab 06/25/21 0405  INR 2.0*    Cardiac Enzymes: No results for input(s): CKTOTAL, CKMB, CKMBINDEX, TROPONINI in the last 168 hours. BNP (last 3 results) No results for input(s): PROBNP in the last 8760 hours. HbA1C: No results for input(s): HGBA1C in the last 72 hours. CBG: Recent Labs  Lab 06/28/21 1145 06/28/21 1539  06/28/21 1942 06/28/21 2334 06/29/21 0344  GLUCAP 128* 130* 160* 122* 109*    Lipid Profile: No results for input(s): CHOL, HDL, LDLCALC, TRIG, CHOLHDL, LDLDIRECT in the last 72 hours. Thyroid Function Tests: No results for input(s): TSH, T4TOTAL, FREET4, T3FREE, THYROIDAB in the last 72 hours. Anemia Panel: No results for input(s): VITAMINB12, FOLATE, FERRITIN, TIBC, IRON, RETICCTPCT in the last 72 hours. Sepsis Labs: Recent Labs  Lab 06/25/21 0405 06/25/21 0630 06/25/21 1431 06/25/21 1505  PROCALCITON  --   --   --  22.01  LATICACIDVEN 5.9* 2.7*  4.6*  --      Recent Results (from the past 240 hour(s))  Resp Panel by RT-PCR (Flu A&B, Covid) Nasopharyngeal Swab     Status: None   Collection Time: 06/25/21  3:37 AM   Specimen: Nasopharyngeal Swab; Nasopharyngeal(NP) swabs in vial transport medium  Result Value Ref Range Status   SARS Coronavirus 2 by RT PCR NEGATIVE NEGATIVE Final    Comment: (NOTE) SARS-CoV-2 target nucleic acids are NOT DETECTED.  The SARS-CoV-2 RNA is generally detectable in upper respiratory specimens during the acute phase of infection. The lowest concentration of SARS-CoV-2 viral copies this assay can detect is 138 copies/mL. A negative result does not preclude SARS-Cov-2 infection and should not be used as the sole basis for treatment or other patient management decisions. A negative result may occur with  improper specimen collection/handling, submission of specimen other than nasopharyngeal swab, presence of viral mutation(s) within the areas targeted by this assay, and inadequate number of viral copies(<138 copies/mL). A negative result must be combined with clinical observations, patient history, and epidemiological information. The expected result is Negative.  Fact Sheet for Patients:  EntrepreneurPulse.com.au  Fact Sheet for Healthcare Providers:  IncredibleEmployment.be  This test is no t yet  approved or cleared by the [place] FDA and  has been authorized for detection and/or diagnosis of SARS-CoV-2 by FDA under an Emergency Use Authorization (EUA). This EUA will remain  in effect (meaning this test can be used) for the duration of the COVID-19 declaration under Section 564(b)(1) of the Act, 21 U.S.C.section 360bbb-3(b)(1), unless the authorization is terminated  or revoked sooner.       Influenza A by PCR NEGATIVE NEGATIVE Final   Influenza B by PCR NEGATIVE NEGATIVE Final    Comment: (NOTE) The Xpert Xpress SARS-CoV-2/FLU/RSV plus assay is intended as an aid in the diagnosis of influenza from Nasopharyngeal swab specimens and should not be used as a sole basis for treatment. Nasal washings and aspirates are unacceptable for Xpert Xpress SARS-CoV-2/FLU/RSV testing.  Fact Sheet for Patients: EntrepreneurPulse.com.au  Fact Sheet for Healthcare Providers: IncredibleEmployment.be  This test is not yet approved or cleared by the [place] FDA and has been authorized for detection and/or diagnosis of SARS-CoV-2 by FDA under an Emergency Use Authorization (EUA). This EUA will remain in effect (meaning this test can be used) for the duration of the COVID-19 declaration under Section 564(b)(1) of the Act, 21 U.S.C. section 360bbb-3(b)(1), unless the authorization is terminated or revoked.  Performed at [facility], [place] [address], [place], [place] [address]   Blood Culture (routine x 2)     Status: None (Preliminary result)   Collection Time: 06/25/21  3:37 AM   Specimen: BLOOD RIGHT ARM  Result Value Ref Range Status   Specimen Description BLOOD RIGHT ARM  Final   Special Requests   Final    AEROBIC BOTTLE ONLY Blood Culture results may not be optimal due to an inadequate volume of blood received in culture bottles   Culture   Final    NO GROWTH 3 DAYS Performed at [facility], [place] [address],  [place], [place] [address]    Report Status PENDING  Incomplete  Blood Culture (routine x 2)     Status: None (Preliminary result)   Collection Time: 06/25/21  3:42 AM   Specimen: BLOOD LEFT ARM  Result Value Ref Range Status   Specimen Description BLOOD LEFT ARM  Final   Special Requests   Final  BOTTLES DRAWN AEROBIC AND ANAEROBIC Blood Culture adequate volume   Culture   Final    NO GROWTH 3 DAYS Performed at [facility], [place] [address], [place], [place] [address]    Report Status PENDING  Incomplete  Urine Culture     Status: Abnormal   Collection Time: 06/25/21  8:38 AM   Specimen: In/Out Cath Urine  Result Value Ref Range Status   Specimen Description IN/OUT CATH URINE  Final   Special Requests   Final    NONE Performed at [facility], [place] [address], [place], [place] [address]    Culture (A)  Final    >=100,000 COLONIES/mL VANCOMYCIN RESISTANT ENTEROCOCCUS   Report Status 06/27/2021 FINAL  Final   Organism ID, Bacteria VANCOMYCIN RESISTANT ENTEROCOCCUS (A)  Final      Susceptibility   Vancomycin resistant enterococcus - MIC*    AMPICILLIN >=32 RESISTANT Resistant     NITROFURANTOIN 128 RESISTANT Resistant     VANCOMYCIN >=32 RESISTANT Resistant     LINEZOLID 2 SENSITIVE Sensitive     * >=100,000 COLONIES/mL VANCOMYCIN RESISTANT ENTEROCOCCUS  MRSA Next Gen by PCR, Nasal     Status: None   Collection Time: 06/27/21  8:45 PM   Specimen: Nasal Mucosa; Nasal Swab  Result Value Ref Range Status   MRSA by PCR Next Gen NOT DETECTED NOT DETECTED Final    Comment: (NOTE) The GeneXpert MRSA Assay (FDA approved for NASAL specimens only), is one component of a comprehensive MRSA colonization surveillance program. It is not intended to diagnose MRSA infection nor to guide or monitor treatment for MRSA infections. Test performance is not FDA approved in patients less than 22 years old. Performed at [facility], [place] [address] [place],  [address]            Radiology Studies: No results found.      Scheduled Meds:  apixaban  5 mg Per Tube BID   carvedilol  25 mg Per Tube BID   chlorhexidine  15 mL Mouth Rinse BID   Chlorhexidine Gluconate Cloth  6 each Topical Daily   feeding supplement (PROSource TF)  45 mL Per Tube Daily   free water  300 mL Per Tube Q4H   insulin aspart  0-9 Units Subcutaneous Q4H   linezolid  600 mg Per Tube Q12H   mouth rinse  15 mL Mouth Rinse q12n4p   multivitamin with minerals  1 tablet Per Tube Daily   pantoprazole sodium  40 mg Per Tube Daily   sodium chloride flush  3 mL Intravenous Q12H   sodium chloride HYPERTONIC  4 mL Nebulization BID   Continuous Infusions:  sodium chloride Stopped (06/26/21 0645)   ampicillin-sulbactam (UNASYN) IV Stopped (06/29/21 0152)   dextrose 10 mL/hr at 06/29/21 0600   feeding supplement (OSMOLITE 1.2 CAL) 1,000 mL (06/28/21 1549)    LOS: 4 days   Time spent: 47min  Avenly Roberge C Yochanan Eddleman, DO [facility]  If 7PM-7AM, please contact night-coverage [email or web address]  06/29/2021, 7:15 AM

## 2021-06-29 NOTE — Progress Notes (Signed)
PT Cancellation Note  Patient Details Name: Jill Buchanan MRN: [id number] DOB: Oct 03, 1960   Cancelled Treatment:    Reason Eval/Treat Not Completed: PT screened, no needs identified, will sign off. Pt long term care resident and per prior encounter dependent with most care. No skilled PT needed for return to long term custodial care.    Shary Decamp [facility] 06/29/2021, 2:34 PM Delyla Sandeen Spencer Pager [phone] Office [phone]

## 2021-06-30 DIAGNOSIS — E878 Other disorders of electrolyte and fluid balance, not elsewhere classified: Secondary | ICD-10-CM

## 2021-06-30 DIAGNOSIS — N390 Urinary tract infection, site not specified: Secondary | ICD-10-CM

## 2021-06-30 DIAGNOSIS — E870 Hyperosmolality and hypernatremia: Secondary | ICD-10-CM

## 2021-06-30 DIAGNOSIS — B952 Enterococcus as the cause of diseases classified elsewhere: Secondary | ICD-10-CM

## 2021-06-30 DIAGNOSIS — I10 Essential (primary) hypertension: Secondary | ICD-10-CM

## 2021-06-30 LAB — BASIC METABOLIC PANEL
Anion gap: 9 (ref 5–15)
BUN: 12 mg/dL (ref 8–23)
CO2: 23 mmol/L (ref 22–32)
Calcium: 8.8 mg/dL — ABNORMAL LOW (ref 8.9–10.3)
Chloride: 106 mmol/L (ref 98–111)
Creatinine, Ser: 0.72 mg/dL (ref 0.44–1.00)
GFR, Estimated: >60 mL/min (ref >60)
Glucose, Bld: 117 mg/dL — ABNORMAL HIGH (ref 70–99)
Potassium: 5.2 mmol/L — ABNORMAL HIGH (ref 3.5–5.1)
Sodium: 138 mmol/L (ref 135–145)

## 2021-06-30 LAB — GLUCOSE, CAPILLARY
Glucose-Capillary: 118 mg/dL — ABNORMAL HIGH (ref 70–99)
Glucose-Capillary: 108 mg/dL — ABNORMAL HIGH (ref 70–99)
Glucose-Capillary: 121 mg/dL — ABNORMAL HIGH (ref 70–99)
Glucose-Capillary: 129 mg/dL — ABNORMAL HIGH (ref 70–99)
Glucose-Capillary: 120 mg/dL — ABNORMAL HIGH (ref 70–99)
Glucose-Capillary: 119 mg/dL — ABNORMAL HIGH (ref 70–99)

## 2021-06-30 LAB — CULTURE, BLOOD (ROUTINE X 2)
Culture: NO GROWTH
Culture: NO GROWTH
Special Requests: ADEQUATE

## 2021-06-30 MED ORDER — WHITE PETROLATUM EX OINT
TOPICAL_OINTMENT | CUTANEOUS | Status: DC | PRN
  Filled 2021-06-30: qty 28.35

## 2021-06-30 NOTE — Assessment & Plan Note (Addendum)
resolved 

## 2021-06-30 NOTE — Progress Notes (Signed)
PROGRESS NOTE  Jill Buchanan [id number] DOB: April 27, 1961 DOA: 06/25/2021 PCP: Pcp, No   LOS: 5 days   Brief Narrative / Interim history: 61 year old F, SNF resident with history of multiple strokes, afib on anticoagulation, CAD s/p distant CABG, AVR, Anemia and previous GI bleeds. Pt presented to [name] ER with reports of poor intake and low oxygen saturations. The patient lives in a SNF due to severe debility in the setting of multiple strokes.  She also has a hx of anemia, GIB, AF on anticoagulation. In December 2022, she was admitted to the hospital and seen by Palliative Care and was a DNR (on current admission this was confirmed by nursing home paperwork and sister by phone).  Subjective / 24h Interval events: -No current complaints, does not interact much with me  Assessment & Plan: Principal problem Severe sepsis secondary to left upper lobe HCAP, POA Cannot rule out concurrent UTI Acute hypoxic respiratory failure -Continue linezolid and unasyn to cover PNA and UTI for 7 day course through 07/03/21.  -Wean O2 for sats >90% -Continue aspiration precautions, chronically unable to tolerate p.o., continue PEG tube feedings as below  Active problems Dehydration Failure to thrive -Profound volume depletion with anemia likely exacerbated by above illness.  -Status post IVF    Acute (unspecified) on chronic anemia of chronic disease Symptomatic anemia -Unclear etiology, status post 2 unit PRBC at intake  -History of GI bleed continues on anticoagulation but no signs or symptoms of blood loss at this time -Hemoglobin stable, most recent one 9.8.  Continue to monitor  AFib on Chronic Anticoagulation HTN, HLD CAD s/p CABG -Continue Coreg, Eliquis   Hypoglycemia - D5 started 1/28. Continue low rate - tube feeds ongoing - Transition off IVF   Hypernatremia, Hypochloremia  Hypokalemia Hypomagnesemia AKI  -Continue to replete as indicated.  Potassium is actually slightly on  the high side today at 5.2.  Creatinine has normalized with fluids and her acute kidney injury is resolved   Hx CVA, Acute on Chronic Encephalopathy  -EEG shows toxic metabolic etiology.   -Mental status appears to be approaching baseline per chart review   Congenital Blindness  -Supportive care    [place] Adult Failure to Thrive  Severe Protein Calorie Malnutrition  -Per previous discussion with ICU family's ultimate goal is to get her home and consider outpatient hospice facilities. Palliative care has been engaged  Scheduled Meds:  apixaban  5 mg Per Tube BID   carvedilol  25 mg Per Tube BID   chlorhexidine  15 mL Mouth Rinse BID   Chlorhexidine Gluconate Cloth  6 each Topical Daily   feeding supplement (PROSource TF)  45 mL Per Tube Daily   free water  300 mL Per Tube Q4H   insulin aspart  0-9 Units Subcutaneous Q4H   linezolid  600 mg Per Tube Q12H   mouth rinse  15 mL Mouth Rinse q12n4p   multivitamin with minerals  1 tablet Per Tube Daily   pantoprazole sodium  40 mg Per Tube Daily   sodium chloride flush  3 mL Intravenous Q12H   sodium chloride HYPERTONIC  4 mL Nebulization BID   Continuous Infusions:  sodium chloride Stopped (06/26/21 0645)   feeding supplement (OSMOLITE 1.2 CAL) 1,000 mL (06/30/21 0239)   PRN Meds:.sodium chloride, acetaminophen **OR** acetaminophen, ipratropium-albuterol, ondansetron **OR** ondansetron (ZOFRAN) IV  Diet Orders (From admission, onward)     Start     Ordered   06/25/21 0337  Diet NPO time specified  (Septic  presentation on arrival (screening labs, nursing and treatment orders for obvious sepsis))  Diet effective now        06/25/21 [id number]            DVT prophylaxis: Place and maintain sequential compression device Start: 06/25/21 1059 apixaban (ELIQUIS) tablet 5 mg   Lab Results  Component Value Date   PLT 121 (L) 06/29/2021      Code Status: Partial Code  Family Communication: no family at bedside   Status is:  Inpatient Remains inpatient appropriate because: Antibiotics,  Planned Discharge Destination: Skilled nursing facility   Level of care: Med-Surg  Consultants:  PCCM  Procedures:  none  Microbiology  Urine culture 1/27-VRE  Antimicrobials: Linezolid, Unasyn    Objective: Vitals:   06/29/21 2300 06/30/21 0000 06/30/21 0300 06/30/21 0743  BP:  108/63    Pulse:  (!) 53    Resp:  (!) 21    Temp: 97.8 F (36.6 C)  98.4 F (36.9 C) 98.5 F (36.9 C)  TempSrc: Oral  Oral Axillary  SpO2:  97%  100%  Weight:        Intake/Output Summary (Last 24 hours) at 06/30/2021 1115 Last data filed at 06/30/2021 0946 Gross per 24 hour  Intake 2555 ml  Output 1650 ml  Net 905 ml   Wt Readings from Last 3 Encounters:  06/26/21 48.6 kg  05/02/21 51.1 kg    Examination:  Constitutional: NAD Eyes: no scleral icterus ENMT: Mucous membranes are moist.  Neck: normal, supple Respiratory: clear to auscultation bilaterally, no wheezing, no crackles. Normal respiratory effort. Cardiovascular: Regular rate and rhythm, no murmurs / rubs / gallops. No LE edema.  Abdomen: non distended, no tenderness. Bowel sounds positive.  Musculoskeletal: no clubbing / cyanosis.  Skin: no rashes Neurologic: Does not follow commands  Data Reviewed: I have independently reviewed following labs and imaging studies   CBC Recent Labs  Lab 06/25/21 0329 06/25/21 0405 06/25/21 0413 06/25/21 1820 06/26/21 0351 06/27/21 0540 06/29/21 0620  WBC 20.7* 19.8*  --  21.1* 20.0* 17.8* 14.3*  HGB 9.6* 6.8* 7.8* 9.3* 9.1* 8.7* 9.8*  HCT 30.7* 25.0* 23.0* 28.3* 27.7* 27.0* 31.3*  PLT 161 240  --  160 140* 125* 121*  MCV 87.7 97.7  --  86.5 85.5 85.7 85.3  MCH 27.4 26.6  --  28.4 28.1 27.6 26.7  MCHC 31.3 27.2*  --  32.9 32.9 32.2 31.3  RDW 15.3 16.0*  --  15.5 15.6* 15.3 14.9  LYMPHSABS 0.4* 0.8  --  1.7  --   --   --   MONOABS 0.8 1.2*  --  0.8  --   --   --   EOSABS 0.0 0.0  --  0.0  --   --   --    BASOSABS 0.0 0.0  --  0.0  --   --   --     Recent Labs  Lab 06/25/21 0405 06/25/21 0413 06/25/21 0630 06/25/21 1431 06/25/21 1505 06/26/21 0351 06/26/21 0900 06/27/21 0540 06/29/21 0620 06/30/21 0643  NA 154* 155*  --   --   --  154*  --  139 141 138  K 4.4 5.5*  --   --   --  3.1*  --  3.2* 3.3* 5.2*  CL 129* 128*  --   --   --  124*  --  110 107 106  CO2 16*  --   --   --   --  21*  --  20* 22 23  GLUCOSE 278* 310*  --   --   --  112*  --  97 106* 117*  BUN 84* 110*  --   --   --  45*  --  26* 13 12  CREATININE 1.79* 1.90*  --   --   --  1.00  --  0.82 0.85 0.72  CALCIUM 7.5*  --   --   --   --  8.7*  --  8.3* 8.6* 8.8*  AST 26  --   --   --   --   --   --  28  --   --   ALT 17  --   --   --   --   --   --  22  --   --   ALKPHOS 47  --   --   --   --   --   --  61  --   --   BILITOT 0.4  --   --   --   --   --   --  1.1  --   --   ALBUMIN 2.2*  --   --   --   --   --   --  2.2*  --   --   MG  --   --   --   --   --   --  2.0 1.7  --   --   PROCALCITON  --   --   --   --  22.01  --   --   --   --   --   LATICACIDVEN 5.9*  --  2.7* 4.6*  --   --   --   --   --   --   INR 2.0*  --   --   --   --   --   --   --   --   --     ------------------------------------------------------------------------------------------------------------------ No results for input(s): CHOL, HDL, LDLCALC, TRIG, CHOLHDL, LDLDIRECT in the last 72 hours.  Lab Results  Component Value Date   HGBA1C 4.9 04/29/2021   ------------------------------------------------------------------------------------------------------------------ No results for input(s): TSH, T4TOTAL, T3FREE, THYROIDAB in the last 72 hours.  Invalid input(s): FREET3  Cardiac Enzymes No results for input(s): CKMB, TROPONINI, MYOGLOBIN in the last 168 hours.  Invalid input(s): CK ------------------------------------------------------------------------------------------------------------------ No results found for:  BNP  CBG: Recent Labs  Lab 06/29/21 1525 06/29/21 2010 06/29/21 2327 06/30/21 0338 06/30/21 0741  GLUCAP 110* 124* 126* 118* 108*    Recent Results (from the past 240 hour(s))  Resp Panel by RT-PCR (Flu A&B, Covid) Nasopharyngeal Swab     Status: None   Collection Time: 06/25/21  3:37 AM   Specimen: Nasopharyngeal Swab; Nasopharyngeal(NP) swabs in vial transport medium  Result Value Ref Range Status   SARS Coronavirus 2 by RT PCR NEGATIVE NEGATIVE Final    Comment: (NOTE) SARS-CoV-2 target nucleic acids are NOT DETECTED.  The SARS-CoV-2 RNA is generally detectable in upper respiratory specimens during the acute phase of infection. The lowest concentration of SARS-CoV-2 viral copies this assay can detect is 138 copies/mL. A negative result does not preclude SARS-Cov-2 infection and should not be used as the sole basis for treatment or other patient management decisions. A negative result may occur with  improper specimen collection/handling, submission of specimen other than nasopharyngeal swab, presence of viral mutation(s) within the areas targeted by this assay, and inadequate number of  viral copies(<138 copies/mL). A negative result must be combined with clinical observations, patient history, and epidemiological information. The expected result is Negative.  Fact Sheet for Patients:  EntrepreneurPulse.com.au  Fact Sheet for Healthcare Providers:  IncredibleEmployment.be  This test is no t yet approved or cleared by the [place] FDA and  has been authorized for detection and/or diagnosis of SARS-CoV-2 by FDA under an Emergency Use Authorization (EUA). This EUA will remain  in effect (meaning this test can be used) for the duration of the COVID-19 declaration under Section 564(b)(1) of the Act, 21 U.S.C.section 360bbb-3(b)(1), unless the authorization is terminated  or revoked sooner.       Influenza A by PCR NEGATIVE  NEGATIVE Final   Influenza B by PCR NEGATIVE NEGATIVE Final    Comment: (NOTE) The Xpert Xpress SARS-CoV-2/FLU/RSV plus assay is intended as an aid in the diagnosis of influenza from Nasopharyngeal swab specimens and should not be used as a sole basis for treatment. Nasal washings and aspirates are unacceptable for Xpert Xpress SARS-CoV-2/FLU/RSV testing.  Fact Sheet for Patients: EntrepreneurPulse.com.au  Fact Sheet for Healthcare Providers: IncredibleEmployment.be  This test is not yet approved or cleared by the [place] FDA and has been authorized for detection and/or diagnosis of SARS-CoV-2 by FDA under an Emergency Use Authorization (EUA). This EUA will remain in effect (meaning this test can be used) for the duration of the COVID-19 declaration under Section 564(b)(1) of the Act, 21 U.S.C. section 360bbb-3(b)(1), unless the authorization is terminated or revoked.  Performed at [facility], [place] [address], [place], [place] [address]   Blood Culture (routine x 2)     Status: None (Preliminary result)   Collection Time: 06/25/21  3:37 AM   Specimen: BLOOD RIGHT ARM  Result Value Ref Range Status   Specimen Description BLOOD RIGHT ARM  Final   Special Requests   Final    AEROBIC BOTTLE ONLY Blood Culture results may not be optimal due to an inadequate volume of blood received in culture bottles   Culture   Final    NO GROWTH 4 DAYS Performed at [facility], [place] [address] [place],  [address]    Report Status PENDING  Incomplete  Blood Culture (routine x 2)     Status: None (Preliminary result)   Collection Time: 06/25/21  3:42 AM   Specimen: BLOOD LEFT ARM  Result Value Ref Range Status   Specimen Description BLOOD LEFT ARM  Final   Special Requests   Final    BOTTLES DRAWN AEROBIC AND ANAEROBIC Blood Culture adequate volume   Culture   Final    NO GROWTH 4 DAYS Performed at [facility], [place]  [address], [place],  [address]    Report Status PENDING  Incomplete  Urine Culture     Status: Abnormal   Collection Time: 06/25/21  8:38 AM   Specimen: In/Out Cath Urine  Result Value Ref Range Status   Specimen Description IN/OUT CATH URINE  Final   Special Requests   Final    NONE Performed at [facility], [place] [address], [place],  [address]    Culture (A)  Final    >=100,000 COLONIES/mL VANCOMYCIN RESISTANT ENTEROCOCCUS   Report Status 06/27/2021 FINAL  Final   Organism ID, Bacteria VANCOMYCIN RESISTANT ENTEROCOCCUS (A)  Final      Susceptibility   Vancomycin resistant enterococcus - MIC*    AMPICILLIN >=32 RESISTANT Resistant  NITROFURANTOIN 128 RESISTANT Resistant     VANCOMYCIN >=32 RESISTANT Resistant     LINEZOLID 2 SENSITIVE Sensitive     * >=100,000 COLONIES/mL VANCOMYCIN RESISTANT ENTEROCOCCUS  MRSA Next Gen by PCR, Nasal     Status: None   Collection Time: 06/27/21  8:45 PM   Specimen: Nasal Mucosa; Nasal Swab  Result Value Ref Range Status   MRSA by PCR Next Gen NOT DETECTED NOT DETECTED Final    Comment: (NOTE) The GeneXpert MRSA Assay (FDA approved for NASAL specimens only), is one component of a comprehensive MRSA colonization surveillance program. It is not intended to diagnose MRSA infection nor to guide or monitor treatment for MRSA infections. Test performance is not FDA approved in patients less than 78 years old. Performed at [facility], [place] [address] [place], [place] [address]      Radiology Studies: No results found.   Marzetta Board, MD, PhD [facility]  Between 7 am - 7 pm I am available, please contact me via Amion (for emergencies) or Securechat (non urgent messages)  Between 7 pm - 7 am I am not available, please contact night coverage MD/APP via Amion

## 2021-06-30 NOTE — Progress Notes (Signed)
OT Cancellation Note  Patient Details Name: Jill Buchanan MRN: [id number] DOB: Apr 27, 1961   Cancelled Treatment:    Reason Eval/Treat Not Completed: OT screened, no needs identified, will sign off Long term care resident at SNF, Dependent for ADLs at baseline. No skilled OT needs at this time.   Lorre Munroe 06/30/2021, 9:22 AM

## 2021-06-30 NOTE — Plan of Care (Signed)

## 2021-06-30 NOTE — TOC Initial Note (Signed)
Transition of Care [facility]) - Initial/Assessment Note    Patient Details  Name: Jill Buchanan MRN: [id number] Date of Birth: [birth year]  Transition of Care [facility]) CM/SW Contact:    Lorri Frederick, LCSW Phone Number: 06/30/2021, 3:25 PM  Clinical Narrative:     CSW spoke with Crystal at [facility], who confirmed that pt is in LTC there and is eligible to return.   Pt oriented x1, CSW spoke with pt daughter Jill Buchanan.  Per Jill Buchanan, pt is in rehab at [facility] and supposed to be getting PT 5x week, has been there since November.  CSW shared that per [facility], pt is LTC and that pt being there since November would also indicate this.  Daughter is hoping pt qualifies for CIR, PT eval is ordered but still pending.  Pt is not vaccinated for covid.  CSW will talk with daughter again after PT eval is complete.             Expected Discharge Plan: Long Term Nursing Home Barriers to Discharge: Continued Medical Work up   Patient Goals and CMS Choice        Expected Discharge Plan and Services Expected Discharge Plan: Long Term Nursing Home     Post Acute Care Choice:  (TBD-pending PT recs) Living arrangements for the past 2 months: Skilled Nursing Facility [facility])                                      Prior Living Arrangements/Services Living arrangements for the past 2 months: Skilled Nursing Facility [facility]) Lives with:: Facility Resident Patient language and need for interpreter reviewed:: No        Need for Family Participation in Patient Care: Yes (Comment) Care giver support system in place?: Yes (comment) Current home services: Other (comment) (na) Criminal Activity/Legal Involvement Pertinent to Current Situation/Hospitalization: No - Comment as needed  Activities of Daily Living      Permission Sought/Granted                  Emotional Assessment       Orientation: : Oriented to Self Alcohol / Substance Use: Not Applicable Psych  Involvement: No (comment)  Admission diagnosis:  Respiratory distress [R06.03] Sepsis (HCC) [A41.9] Pneumonia of left lung due to infectious organism [J18.9] Patient Active Problem List   Diagnosis Date Noted   HTN (hypertension) 06/30/2021   UTI (urinary tract infection) due to VRE 06/30/2021   Hypernatremia 06/30/2021   Hypochloremia 06/30/2021   Hypomagnesemia 06/30/2021   Pressure injury of skin 06/28/2021   Pneumonia of right middle lobe due to infectious organism 06/25/2021   Pneumonia of left lung due to infectious organism 06/25/2021   Aspiration pneumonia (HCC) 06/25/2021   Severe sepsis with septic shock (HCC) 06/25/2021   Atrial fibrillation, chronic (HCC) 06/25/2021   CAD, multiple vessel 06/25/2021   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 (CVA) 06/25/2021   Goals of care, counseling/discussion 06/25/2021   AKI (acute kidney injury) (HCC) 06/25/2021   Severe sepsis (HCC) 06/25/2021   Respiratory distress    Malnutrition of moderate degree 04/30/2021   Acute GI bleeding    Anemia    Encounter for imaging to screen for metal prior to MRI    Recurrent cerebrovascular accidents (CVAs) (HCC) 04/28/2021   COVID-19 virus infection    Heme positive stool    Acute blood loss anemia  General weakness    Fever 04/27/2021   PCP:  Pcp, No Pharmacy:  No Pharmacies Listed    Social Determinants of Health (SDOH) Interventions    Readmission Risk Interventions No flowsheet data found.

## 2021-06-30 NOTE — Assessment & Plan Note (Addendum)
-  completed a course of Linezolid

## 2021-06-30 NOTE — Assessment & Plan Note (Signed)
--  monitor BP

## 2021-06-30 NOTE — Hospital Course (Signed)
61 year old F, SNF resident with history of multiple strokes, afib on anticoagulation, CAD s/p distant CABG, AVR, Anemia and previous GI bleeds. Pt presented to [facility] ER with reports of poor intake and low oxygen saturations. The patient lives in a SNF due to severe debility in the setting of multiple strokes.  She also has a hx of anemia, GIB, AF on anticoagulation. In December 2022, she was admitted to the hospital and seen by Palliative Care and was a DNR (on current admission this was confirmed by nursing home paperwork and sister by phone).

## 2021-07-01 ENCOUNTER — Encounter (HOSPITAL_COMMUNITY): Payer: Self-pay | Admitting: Internal Medicine

## 2021-07-01 ENCOUNTER — Inpatient Hospital Stay (HOSPITAL_COMMUNITY): Payer: Medicaid Other

## 2021-07-01 DIAGNOSIS — G9341 Metabolic encephalopathy: Secondary | ICD-10-CM

## 2021-07-01 LAB — COMPREHENSIVE METABOLIC PANEL
ALT: 15 U/L (ref 0–44)
AST: 17 U/L (ref 15–41)
Albumin: 2 g/dL — ABNORMAL LOW (ref 3.5–5.0)
Alkaline Phosphatase: 57 U/L (ref 38–126)
Anion gap: 9 (ref 5–15)
BUN: 14 mg/dL (ref 8–23)
CO2: 25 mmol/L (ref 22–32)
Calcium: 8.9 mg/dL (ref 8.9–10.3)
Chloride: 103 mmol/L (ref 98–111)
Creatinine, Ser: 0.66 mg/dL (ref 0.44–1.00)
GFR, Estimated: >60 mL/min (ref >60)
Glucose, Bld: 122 mg/dL — ABNORMAL HIGH (ref 70–99)
Potassium: 4.5 mmol/L (ref 3.5–5.1)
Sodium: 137 mmol/L (ref 135–145)
Total Bilirubin: 0.4 mg/dL (ref 0.3–1.2)
Total Protein: 6 g/dL — ABNORMAL LOW (ref 6.5–8.1)

## 2021-07-01 LAB — CBC
HCT: 30.2 % — ABNORMAL LOW (ref 36.0–46.0)
Hemoglobin: 9.4 g/dL — ABNORMAL LOW (ref 12.0–15.0)
MCH: 26.8 pg (ref 26.0–34.0)
MCHC: 31.1 g/dL (ref 30.0–36.0)
MCV: 86 fL (ref 80.0–100.0)
Platelets: 185 10*3/uL (ref 150–400)
RBC: 3.51 MIL/uL — ABNORMAL LOW (ref 3.87–5.11)
RDW: 15.6 % — ABNORMAL HIGH (ref 11.5–15.5)
WBC: 12.3 10*3/uL — ABNORMAL HIGH (ref 4.0–10.5)
nRBC: 0 % (ref 0.0–0.2)

## 2021-07-01 LAB — GLUCOSE, CAPILLARY
Glucose-Capillary: 111 mg/dL — ABNORMAL HIGH (ref 70–99)
Glucose-Capillary: 111 mg/dL — ABNORMAL HIGH (ref 70–99)
Glucose-Capillary: 116 mg/dL — ABNORMAL HIGH (ref 70–99)
Glucose-Capillary: 124 mg/dL — ABNORMAL HIGH (ref 70–99)

## 2021-07-01 IMAGING — DX DG SKULL 1-3V
1 series · 2 of 2 positions shown · non-contrast
Comparison: [DATE]

CLINICAL DATA: Evaluate for possible foreign body

EXAM:
SKULL - 1-3 VIEW

[Series 1: skull · 0.14mm/px · 2 of 2 slices shown]
[im 1/2]
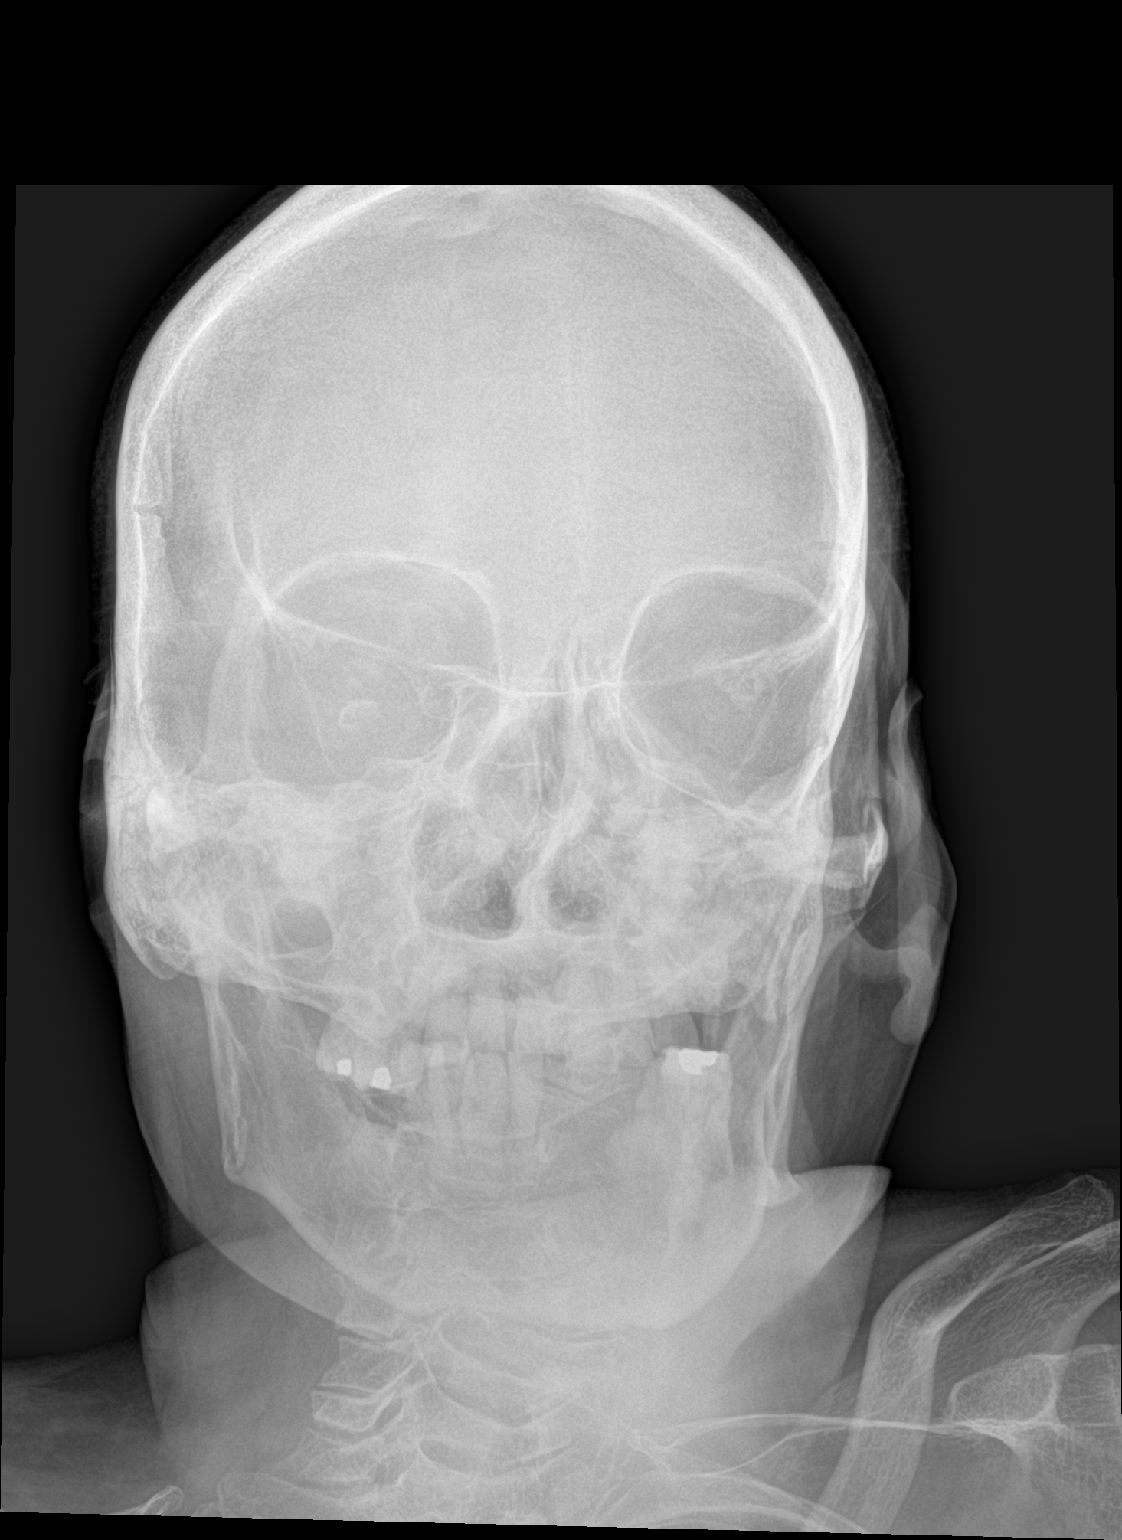
[im 2/2]
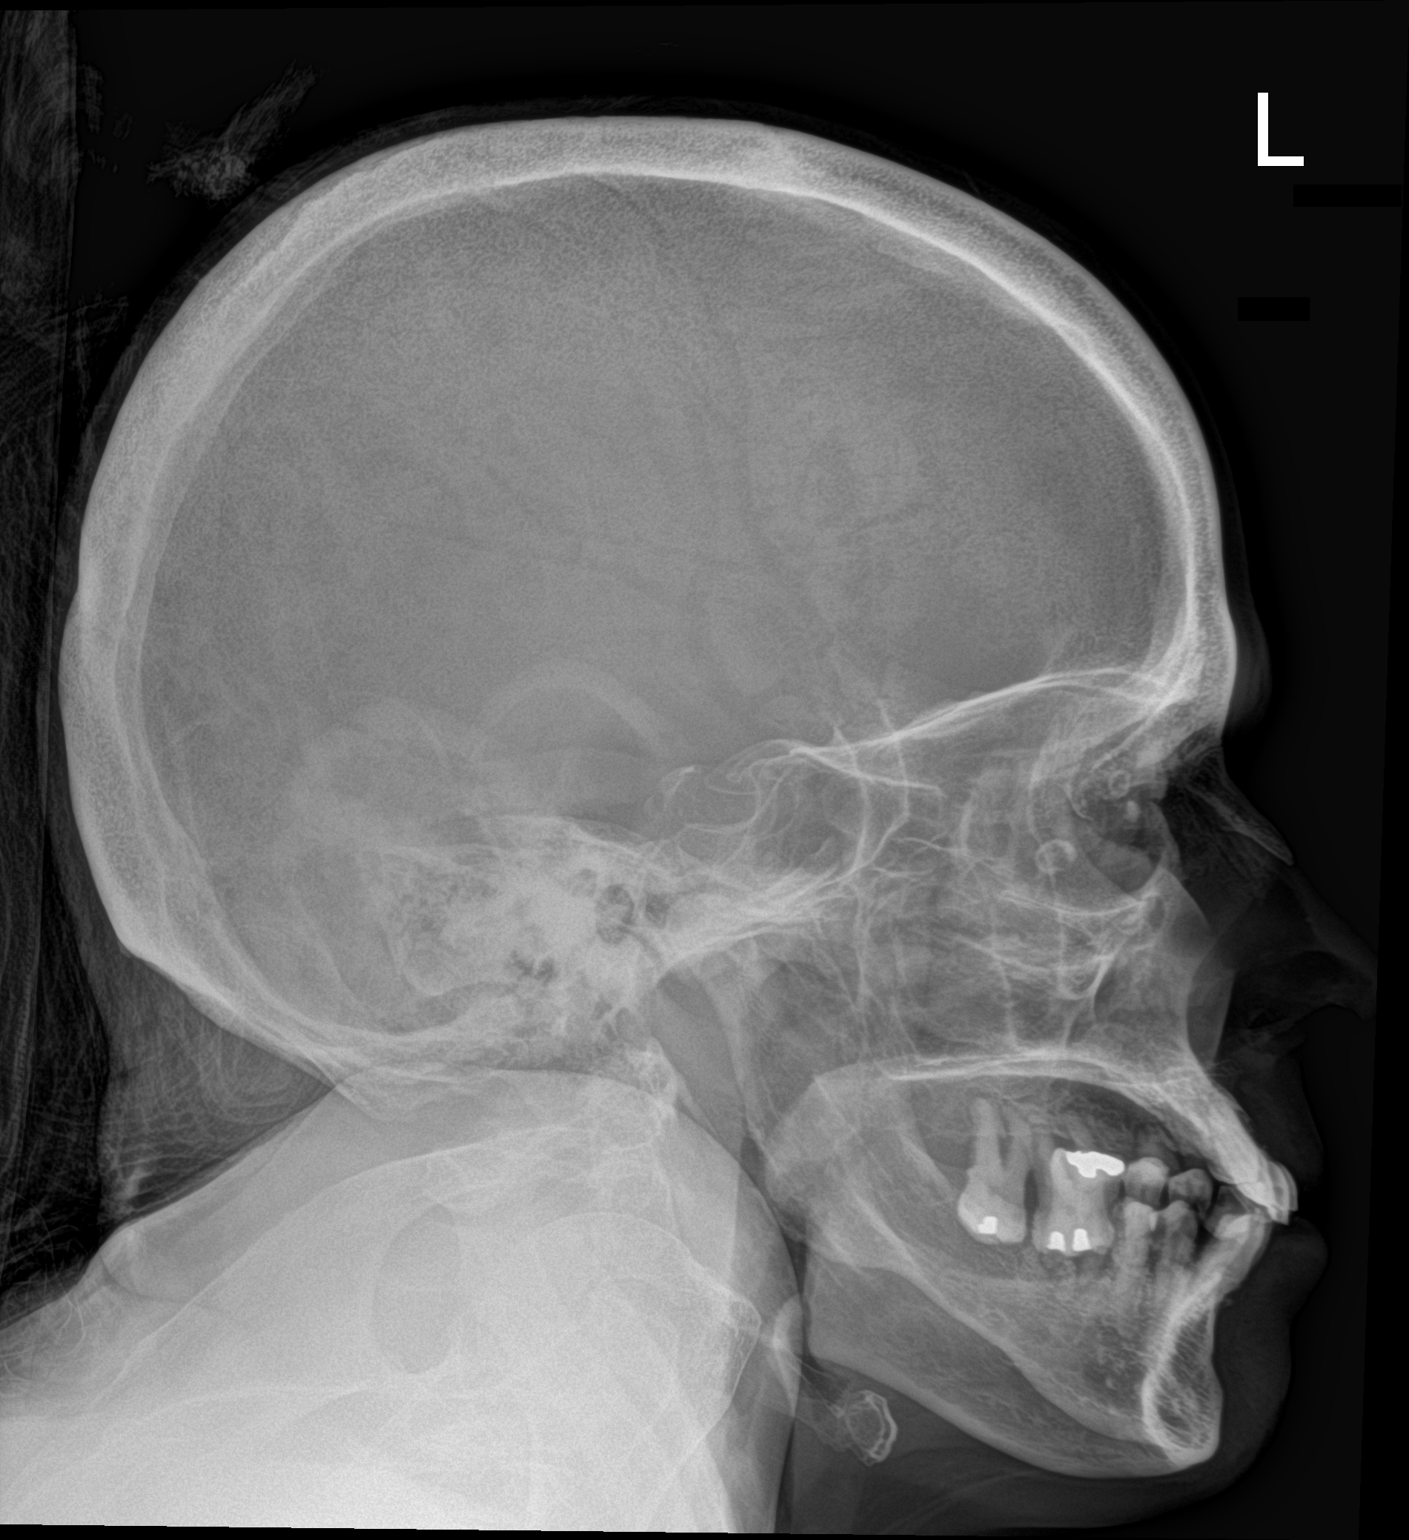

[2 of 2 positions shown; findings below may reference images not displayed]

FINDINGS: No acute bony abnormality is noted. No radiopaque foreign body is
seen. Calcified globes are noted bilaterally stable in appearance
from prior CT.
IMPRESSION: No radiopaque foreign body is noted.

## 2021-07-01 IMAGING — DX DG ABD PORTABLE 1V
1 series · 2 of 2 positions shown · non-contrast
Comparison: [DATE]

CLINICAL DATA: MRI clearance.

EXAM:
PORTABLE CHEST 1 VIEW, abdomen portable.

[Series 1: abdomen · 0.14mm/px · 2 of 2 slices shown]
[im 1/2]
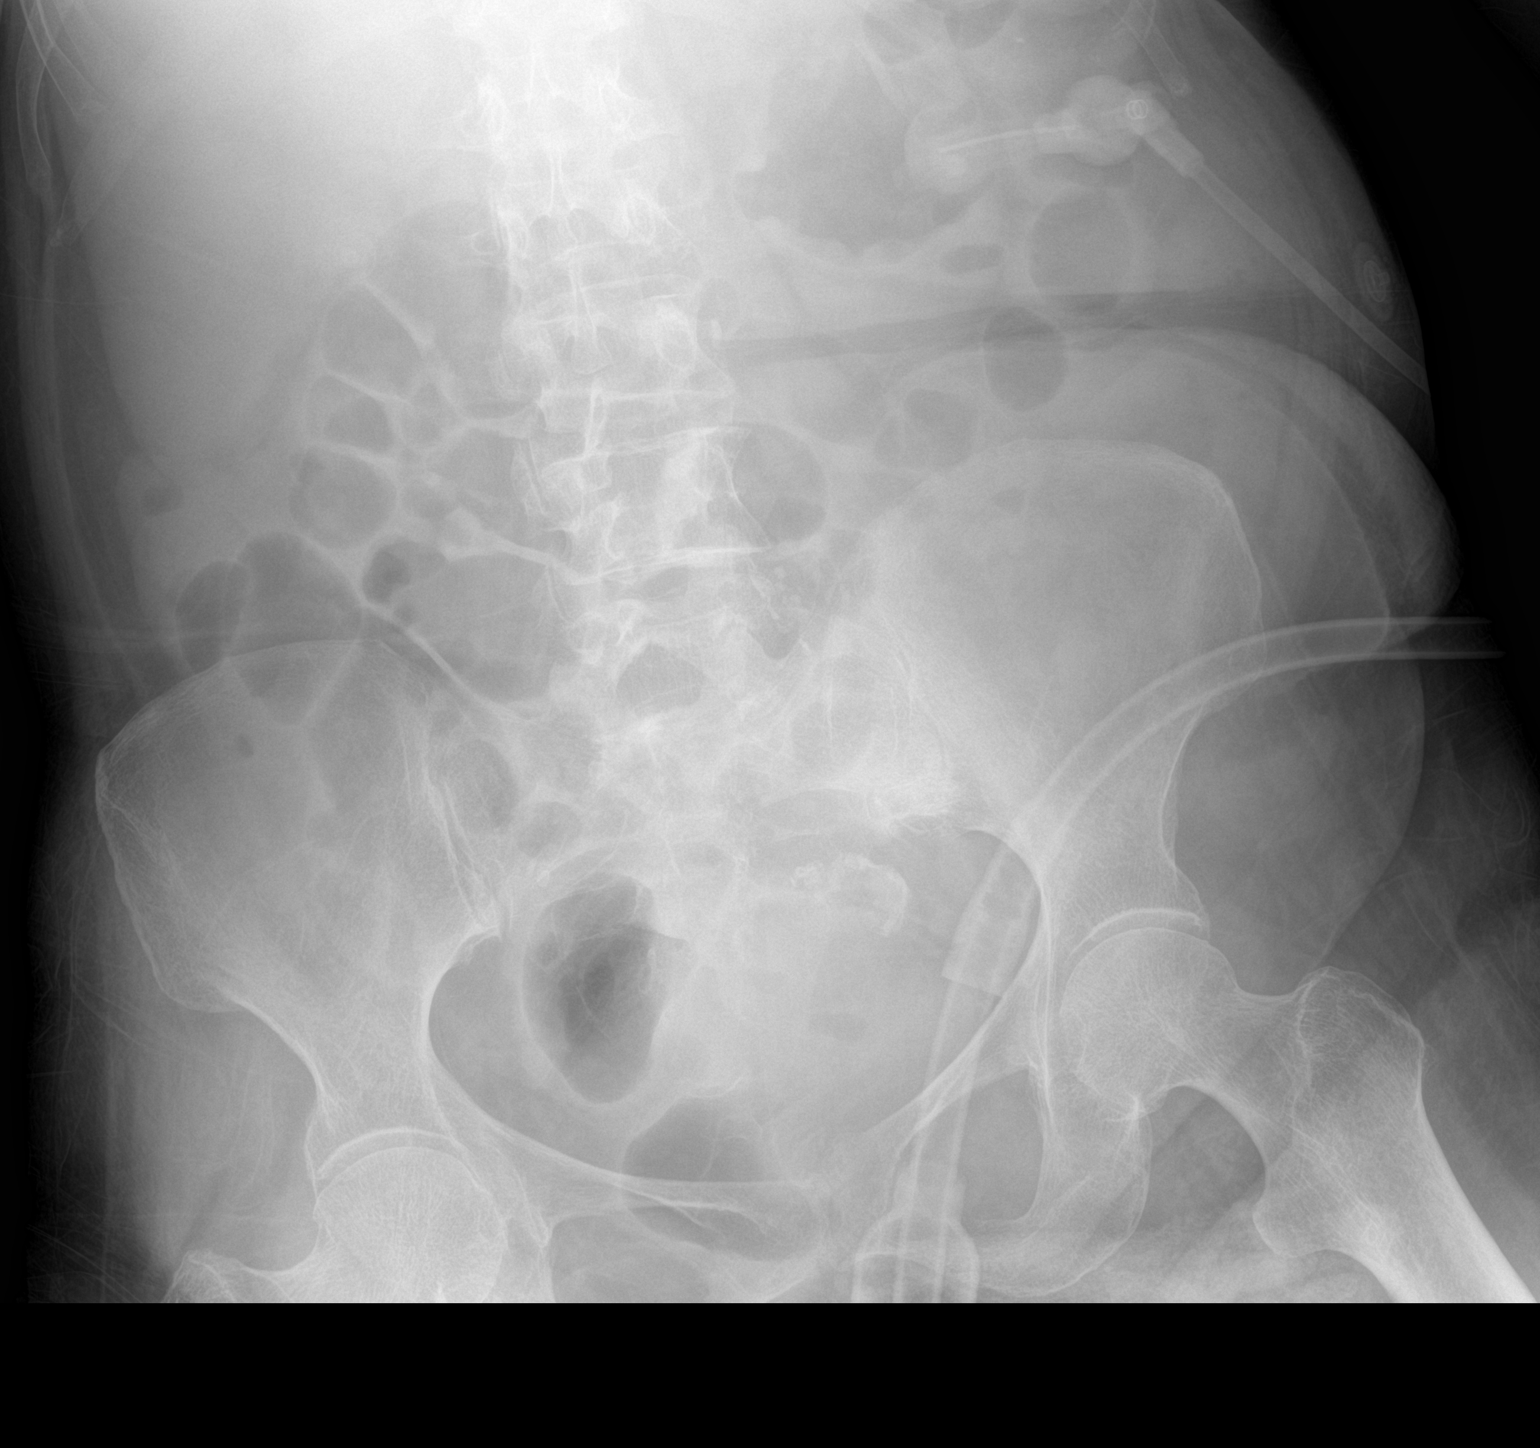
[im 2/2]
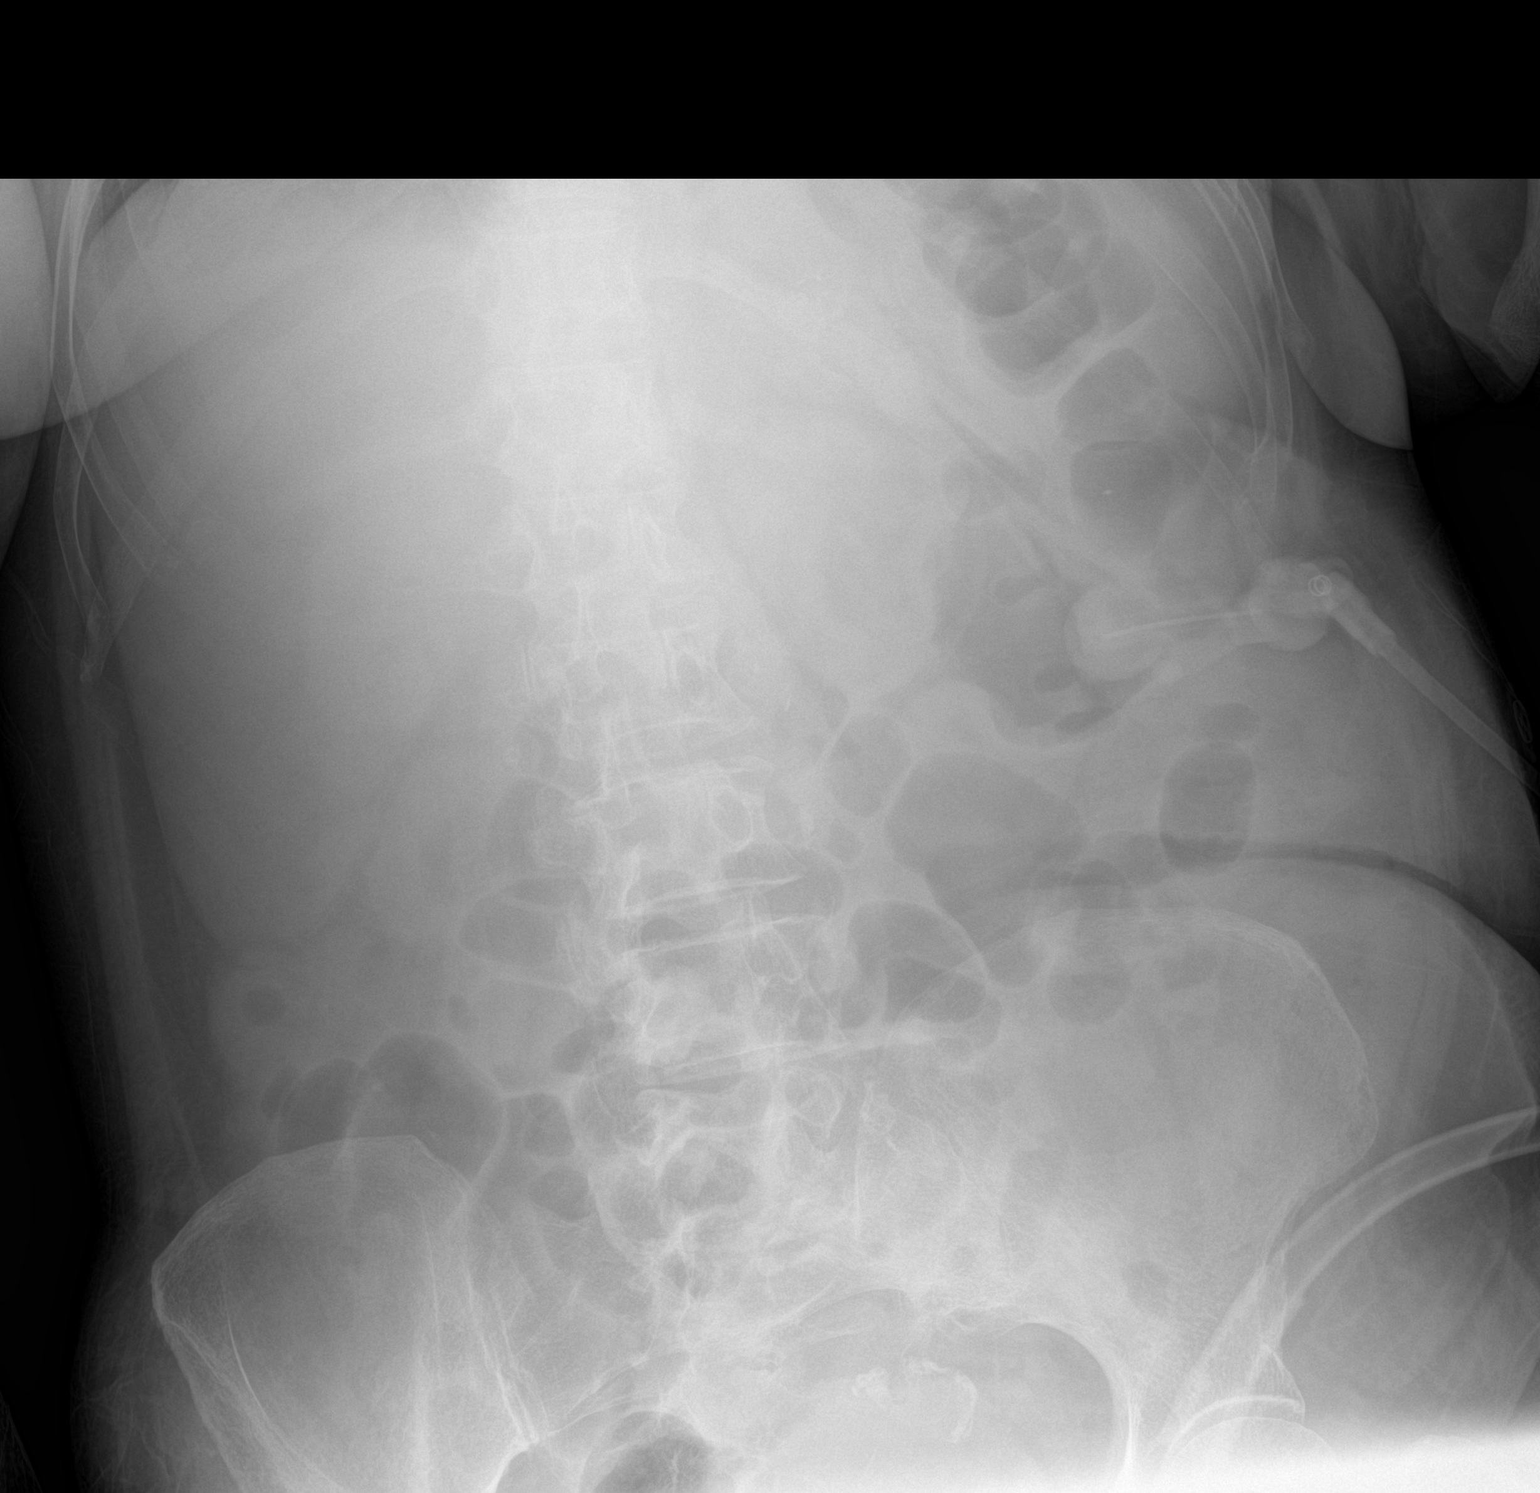

[2 of 2 positions shown; findings below may reference images not displayed]

FINDINGS: The heart is enlarged and there is evidence of prior cardiothoracic
surgery. Presumed prosthetic cardiac valves are noted.
Atherosclerotic calcification of the aorta is noted. An opacity is
noted at the left costophrenic angle which is unchanged from
multiple prior exams. The right lung is clear. No pneumothorax. No
acute osseous abnormality.

There is a nonobstructive bowel-gas pattern. A tubular structure
with balloon is terminates over the mid left abdomen, possible
feeding tube. No radiopaque foreign body is identified. Suture
material is noted in the pelvis. No acute osseous abnormality.
IMPRESSION: 1. Stable opacity at the left costophrenic angle, may represent a
prominent epicardial fat pad versus atelectasis, infiltrate, and/or
effusion.
2. Evidence of prior cardiothoracic surgery with prosthetic cardiac
valves.
3. Nonobstructive bowel-gas pattern.
4. Tubular structure with balloon terminating over the mid left
abdomen. Correlate clinically for feeding tube.

## 2021-07-01 IMAGING — DX DG CHEST 1V PORT
1 series · 1 of 1 positions shown · non-contrast
Comparison: [DATE]

CLINICAL DATA: MRI clearance.

EXAM:
PORTABLE CHEST 1 VIEW, abdomen portable.

[chest]
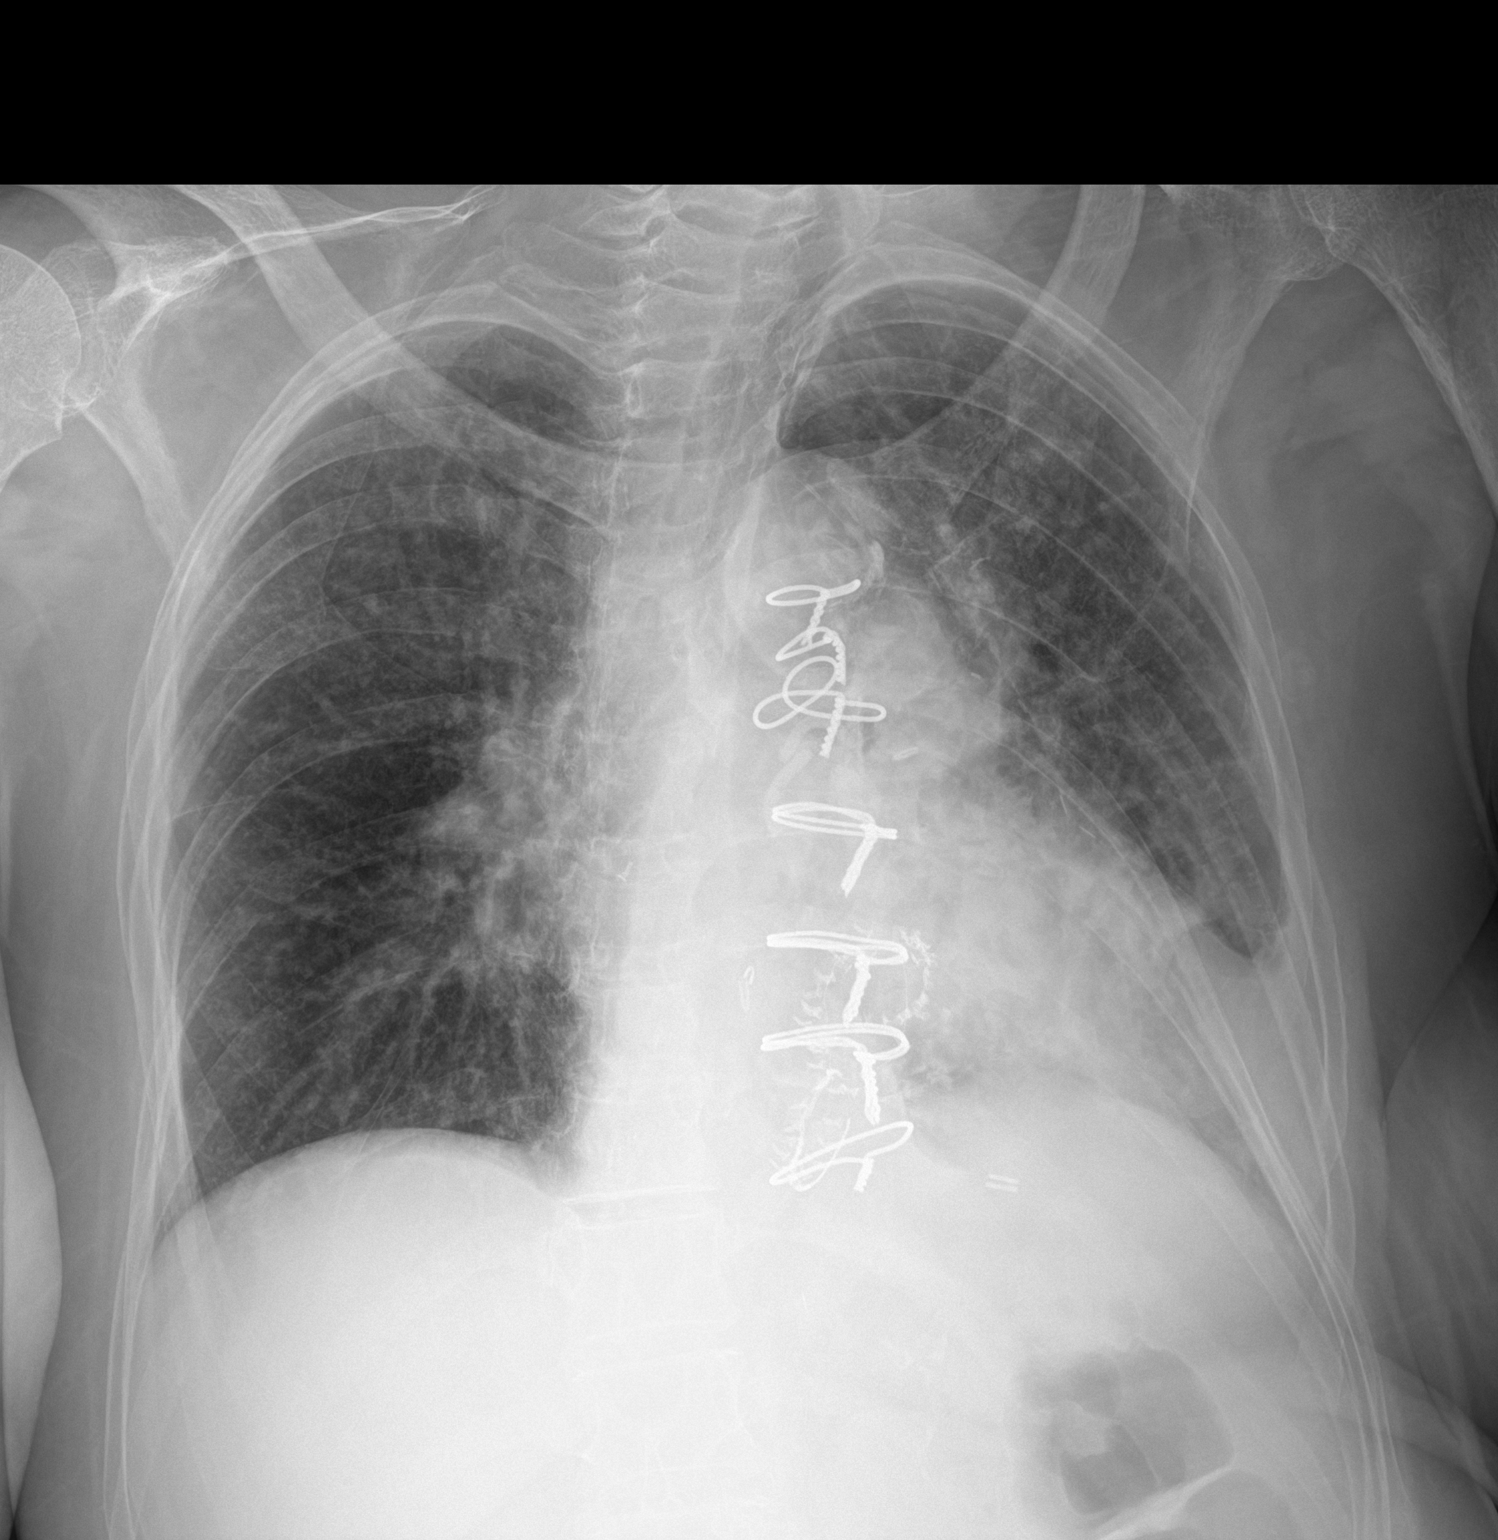

[1 of 1 positions shown; findings below may reference images not displayed]

FINDINGS: The heart is enlarged and there is evidence of prior cardiothoracic
surgery. Presumed prosthetic cardiac valves are noted.
Atherosclerotic calcification of the aorta is noted. An opacity is
noted at the left costophrenic angle which is unchanged from
multiple prior exams. The right lung is clear. No pneumothorax. No
acute osseous abnormality.

There is a nonobstructive bowel-gas pattern. A tubular structure
with balloon is terminates over the mid left abdomen, possible
feeding tube. No radiopaque foreign body is identified. Suture
material is noted in the pelvis. No acute osseous abnormality.
IMPRESSION: 1. Stable opacity at the left costophrenic angle, may represent a
prominent epicardial fat pad versus atelectasis, infiltrate, and/or
effusion.
2. Evidence of prior cardiothoracic surgery with prosthetic cardiac
valves.
3. Nonobstructive bowel-gas pattern.
4. Tubular structure with balloon terminating over the mid left
abdomen. Correlate clinically for feeding tube.

## 2021-07-01 NOTE — Procedures (Signed)
Patient Name: Jill Buchanan  MRN: [id number]  Epilepsy Attending: Charlsie Quest  Referring Physician/Provider: Leatha Gilding Date: 07/01/2021 Duration: 21.45 mins   Patient history: 61 year old female with prior strokes with altered mental status.  EEG to evaluate for seizures.   Level of alertness: Awake   AEDs during EEG study: None   Technical aspects: This EEG study was done with scalp electrodes positioned according to the 10-20 International system of electrode placement. Electrical activity was acquired at a sampling rate of 500Hz  and reviewed with a high frequency filter of 70Hz  and a low frequency filter of 1Hz . EEG data were recorded continuously and digitally stored.    Description: No clear posterior dominant rhythm was seen. EEG showed continuous generalized 3 to 5 Hz theta-delta slowing. Hyperventilation and photic stimulation were not performed.      ABNORMALITY - Continuous slow, generalized   IMPRESSION: This study is suggestive of moderate diffuse encephalopathy, nonspecific etiology.  No seizures or definite epileptiform discharges were seen throughout the recording.   [name] 

## 2021-07-01 NOTE — Progress Notes (Signed)
PROGRESS NOTE  Jill Buchanan [id number] DOB: 07-Mar-1961 DOA: 06/25/2021 PCP: Pcp, No   LOS: 6 days   Brief Narrative / Interim history: 61 year old F, SNF resident with history of multiple strokes, afib on anticoagulation, CAD s/p distant CABG, AVR, Anemia and previous GI bleeds. Pt presented to [facility] ER with reports of poor intake and low oxygen saturations. The patient lives in a SNF due to severe debility in the setting of multiple strokes.  She also has a hx of anemia, GIB, AF on anticoagulation. In December 2022, she was admitted to the hospital and seen by Palliative Care and was a DNR (on current admission this was confirmed by nursing home paperwork and sister by phone).  Subjective / 24h Interval events: -Does not interact much with me  Assessment & Plan: Aspiration pneumonia ([place])- (present on admission) -Patient with L-sided PNA in the setting of prior recurrent strokes and known dysphagia, likely aspiration. On antibiotics  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 (CVA) -Has PEG tube and receives tube feeds. However, according to her daughter, she has also been taking some pureed liquids and speech may have given thin liquids this week; she was planned for an invasive swallow evaluation soon  CAD, multiple vessel- (present on admission) -s/p CABG. No chest pain, stable  Atrial fibrillation, chronic ([place])- (present on admission) -Continue Coreg, Eliquis  Acute metabolic encephalopathy - At baseline she talks a little bit but currently she seems to be less responsive than I would expect.  Given significant recurrent strokes obtain an MRI of the brain as well as an EEG  Hypomagnesemia -monitor  Hypochloremia -monitor  Hypernatremia -monitor  UTI (urinary tract infection) due to VRE -On Linezolid  HTN (hypertension) -monitor BP  AKI (acute kidney injury) ([place]) -Cr normalized with fluids  Goals of care, counseling/discussion -no CPR but do  intubate  Anemia- (present on admission) -Unclear etiology, status post 2 unit PRBC at intake. History of GI bleed continues on anticoagulation but no signs or symptoms of blood loss at this time. Hemoglobin stable, most recent one 9.8.  Continue to monitor  Recurrent cerebrovascular accidents (CVAs) ([place])- (present on admission) -Multiple prior strokes   Scheduled Meds:  apixaban  5 mg Per Tube BID   carvedilol  25 mg Per Tube BID   chlorhexidine  15 mL Mouth Rinse BID   Chlorhexidine Gluconate Cloth  6 each Topical Daily   feeding supplement (PROSource TF)  45 mL Per Tube Daily   free water  300 mL Per Tube Q4H   insulin aspart  0-9 Units Subcutaneous Q4H   linezolid  600 mg Per Tube Q12H   mouth rinse  15 mL Mouth Rinse q12n4p   multivitamin with minerals  1 tablet Per Tube Daily   pantoprazole sodium  40 mg Per Tube Daily   sodium chloride flush  3 mL Intravenous Q12H   Continuous Infusions:  sodium chloride Stopped (06/26/21 0645)   feeding supplement (OSMOLITE 1.2 CAL) 1,000 mL (07/01/21 0948)   PRN Meds:.sodium chloride, acetaminophen **OR** acetaminophen, ipratropium-albuterol, ondansetron **OR** ondansetron (ZOFRAN) IV, white petrolatum  Diet Orders (From admission, onward)     Start     Ordered   06/25/21 0337  Diet NPO time specified  (Septic presentation on arrival (screening labs, nursing and treatment orders for obvious sepsis))  Diet effective now        06/25/21 [id number]            DVT prophylaxis: Place and maintain sequential compression device  Start: 06/25/21 1059 apixaban (ELIQUIS) tablet 5 mg   Lab Results  Component Value Date   PLT 185 07/01/2021      Code Status: Partial Code  Family Communication: We will call daughter later  Status is: Inpatient  Remains inpatient appropriate because: Persistent encephalopathy  Level of care: Med-Surg  Consultants:  Critical care  Procedures:  none  Microbiology  Urine culture 1/27-VRE    Antimicrobials: Linezolid, Unasyn    Objective: Vitals:   07/01/21 0330 07/01/21 0400 07/01/21 0800 07/01/21 0833  BP:  113/69  135/69  Pulse:  (!) 51  60  Resp:  (!) 21  (!) 24  Temp: 98.7 F (37.1 C)  98 F (36.7 C)   TempSrc: Axillary  Oral   SpO2:  97%  96%  Weight:        Intake/Output Summary (Last 24 hours) at 07/01/2021 1158 Last data filed at 07/01/2021 1000 Gross per 24 hour  Intake 3233 ml  Output 1700 ml  Net 1533 ml   Wt Readings from Last 3 Encounters:  06/26/21 48.6 kg  05/02/21 51.1 kg    Examination:  Constitutional: NAD Eyes: no scleral icterus ENMT: Mucous membranes are moist.  Neck: normal, supple Respiratory: clear to auscultation bilaterally, no wheezing, no crackles.  Cardiovascular: Regular rate and rhythm, no murmurs / rubs / gallops. Abdomen: non distended, no tenderness. Bowel sounds positive.  Musculoskeletal: no clubbing / cyanosis.  Skin: no rashes Neurologic: non focal   Data Reviewed: I have independently reviewed following labs and imaging studies  CBC Recent Labs  Lab 06/25/21 0329 06/25/21 0405 06/25/21 0413 06/25/21 1820 06/26/21 0351 06/27/21 0540 06/29/21 0620 07/01/21 0625  WBC 20.7* 19.8*  --  21.1* 20.0* 17.8* 14.3* 12.3*  HGB 9.6* 6.8*   < > 9.3* 9.1* 8.7* 9.8* 9.4*  HCT 30.7* 25.0*   < > 28.3* 27.7* 27.0* 31.3* 30.2*  PLT 161 240  --  160 140* 125* 121* 185  MCV 87.7 97.7  --  86.5 85.5 85.7 85.3 86.0  MCH 27.4 26.6  --  28.4 28.1 27.6 26.7 26.8  MCHC 31.3 27.2*  --  32.9 32.9 32.2 31.3 31.1  RDW 15.3 16.0*  --  15.5 15.6* 15.3 14.9 15.6*  LYMPHSABS 0.4* 0.8  --  1.7  --   --   --   --   MONOABS 0.8 1.2*  --  0.8  --   --   --   --   EOSABS 0.0 0.0  --  0.0  --   --   --   --   BASOSABS 0.0 0.0  --  0.0  --   --   --   --    < > = values in this interval not displayed.    Recent Labs  Lab 06/25/21 0405 06/25/21 0413 06/25/21 0630 06/25/21 1431 06/25/21 1505 06/26/21 0351 06/26/21 0900  06/27/21 0540 06/29/21 0620 06/30/21 0643 07/01/21 0625  NA 154*   < >  --   --   --  154*  --  139 141 138 137  K 4.4   < >  --   --   --  3.1*  --  3.2* 3.3* 5.2* 4.5  CL 129*   < >  --   --   --  124*  --  110 107 106 103  CO2 16*  --   --   --   --  21*  --  20* 22 23  25  GLUCOSE 278*   < >  --   --   --  112*  --  97 106* 117* 122*  BUN 84*   < >  --   --   --  45*  --  26* 13 12 14   CREATININE 1.79*   < >  --   --   --  1.00  --  0.82 0.85 0.72 0.66  CALCIUM 7.5*  --   --   --   --  8.7*  --  8.3* 8.6* 8.8* 8.9  AST 26  --   --   --   --   --   --  28  --   --  17  ALT 17  --   --   --   --   --   --  22  --   --  15  ALKPHOS 47  --   --   --   --   --   --  61  --   --  57  BILITOT 0.4  --   --   --   --   --   --  1.1  --   --  0.4  ALBUMIN 2.2*  --   --   --   --   --   --  2.2*  --   --  2.0*  MG  --   --   --   --   --   --  2.0 1.7  --   --   --   PROCALCITON  --   --   --   --  22.01  --   --   --   --   --   --   LATICACIDVEN 5.9*  --  2.7* 4.6*  --   --   --   --   --   --   --   INR 2.0*  --   --   --   --   --   --   --   --   --   --    < > = values in this interval not displayed.    ------------------------------------------------------------------------------------------------------------------ No results for input(s): CHOL, HDL, LDLCALC, TRIG, CHOLHDL, LDLDIRECT in the last 72 hours.  Lab Results  Component Value Date   HGBA1C 4.9 04/29/2021   ------------------------------------------------------------------------------------------------------------------ No results for input(s): TSH, T4TOTAL, T3FREE, THYROIDAB in the last 72 hours.  Invalid input(s): FREET3  Cardiac Enzymes No results for input(s): CKMB, TROPONINI, MYOGLOBIN in the last 168 hours.  Invalid input(s): CK ------------------------------------------------------------------------------------------------------------------ No results found for: BNP  CBG: Recent Labs  Lab 06/30/21 1944  06/30/21 2313 07/01/21 0337 07/01/21 0728 07/01/21 1154  GLUCAP 120* 119* 111* 111* 116*    Recent Results (from the past 240 hour(s))  Resp Panel by RT-PCR (Flu A&B, Covid) Nasopharyngeal Swab     Status: None   Collection Time: 06/25/21  3:37 AM   Specimen: Nasopharyngeal Swab; Nasopharyngeal(NP) swabs in vial transport medium  Result Value Ref Range Status   SARS Coronavirus 2 by RT PCR NEGATIVE NEGATIVE Final    Comment: (NOTE) SARS-CoV-2 target nucleic acids are NOT DETECTED.  The SARS-CoV-2 RNA is generally detectable in upper respiratory specimens during the acute phase of infection. The lowest concentration of SARS-CoV-2 viral copies this assay can detect is 138 copies/mL. A negative result does not preclude SARS-Cov-2 infection and should not be used as the sole basis  for treatment or other patient management decisions. A negative result may occur with  improper specimen collection/handling, submission of specimen other than nasopharyngeal swab, presence of viral mutation(s) within the areas targeted by this assay, and inadequate number of viral copies(<138 copies/mL). A negative result must be combined with clinical observations, patient history, and epidemiological information. The expected result is Negative.  Fact Sheet for Patients:  EntrepreneurPulse.com.au  Fact Sheet for Healthcare Providers:  IncredibleEmployment.be  This test is no t yet approved or cleared by the [place] FDA and  has been authorized for detection and/or diagnosis of SARS-CoV-2 by FDA under an Emergency Use Authorization (EUA). This EUA will remain  in effect (meaning this test can be used) for the duration of the COVID-19 declaration under Section 564(b)(1) of the Act, 21 U.S.C.section 360bbb-3(b)(1), unless the authorization is terminated  or revoked sooner.       Influenza A by PCR NEGATIVE NEGATIVE Final   Influenza B by PCR NEGATIVE  NEGATIVE Final    Comment: (NOTE) The Xpert Xpress SARS-CoV-2/FLU/RSV plus assay is intended as an aid in the diagnosis of influenza from Nasopharyngeal swab specimens and should not be used as a sole basis for treatment. Nasal washings and aspirates are unacceptable for Xpert Xpress SARS-CoV-2/FLU/RSV testing.  Fact Sheet for Patients: EntrepreneurPulse.com.au  Fact Sheet for Healthcare Providers: IncredibleEmployment.be  This test is not yet approved or cleared by the [place] FDA and has been authorized for detection and/or diagnosis of SARS-CoV-2 by FDA under an Emergency Use Authorization (EUA). This EUA will remain in effect (meaning this test can be used) for the duration of the COVID-19 declaration under Section 564(b)(1) of the Act, 21 U.S.C. section 360bbb-3(b)(1), unless the authorization is terminated or revoked.  Performed at [facility], [place] [address] [place], [place] [address]   Blood Culture (routine x 2)     Status: None   Collection Time: 06/25/21  3:37 AM   Specimen: BLOOD RIGHT ARM  Result Value Ref Range Status   Specimen Description BLOOD RIGHT ARM  Final   Special Requests   Final    AEROBIC BOTTLE ONLY Blood Culture results may not be optimal due to an inadequate volume of blood received in culture bottles   Culture   Final    NO GROWTH 5 DAYS Performed at [facility], [place] [address], [place], [place] [address]    Report Status 06/30/2021 FINAL  Final  Blood Culture (routine x 2)     Status: None   Collection Time: 06/25/21  3:42 AM   Specimen: BLOOD LEFT ARM  Result Value Ref Range Status   Specimen Description BLOOD LEFT ARM  Final   Special Requests   Final    BOTTLES DRAWN AEROBIC AND ANAEROBIC Blood Culture adequate volume   Culture   Final    NO GROWTH 5 DAYS Performed at [facility], [place] [address] [place], [place] [address]    Report Status 06/30/2021 FINAL  Final  Urine  Culture     Status: Abnormal   Collection Time: 06/25/21  8:38 AM   Specimen: In/Out Cath Urine  Result Value Ref Range Status   Specimen Description IN/OUT CATH URINE  Final   Special Requests   Final    NONE Performed at [facility], [place] [address], [place], [place] [address]    Culture (A)  Final    >=100,000 COLONIES/mL VANCOMYCIN RESISTANT ENTEROCOCCUS   Report Status 06/27/2021 FINAL  Final   Organism ID, Bacteria VANCOMYCIN RESISTANT ENTEROCOCCUS (A)  Final      Susceptibility   Vancomycin resistant enterococcus - MIC*    AMPICILLIN >=32 RESISTANT Resistant     NITROFURANTOIN 128 RESISTANT Resistant     VANCOMYCIN >=32 RESISTANT Resistant     LINEZOLID 2 SENSITIVE Sensitive     * >=100,000 COLONIES/mL VANCOMYCIN RESISTANT ENTEROCOCCUS  MRSA Next Gen by PCR, Nasal     Status: None   Collection Time: 06/27/21  8:45 PM   Specimen: Nasal Mucosa; Nasal Swab  Result Value Ref Range Status   MRSA by PCR Next Gen NOT DETECTED NOT DETECTED Final    Comment: (NOTE) The GeneXpert MRSA Assay (FDA approved for NASAL specimens only), is one component of a comprehensive MRSA colonization surveillance program. It is not intended to diagnose MRSA infection nor to guide or monitor treatment for MRSA infections. Test performance is not FDA approved in patients less than 59 years old. Performed at [facility], [place] [address], [place], [place] [address]      Radiology Studies: No results found.  Marzetta Board, MD, PhD [facility]  Between 7 am - 7 pm I am available, please contact me via Amion (for emergencies) or Securechat (non urgent messages)  Between 7 pm - 7 am I am not available, please contact night coverage MD/APP via Amion

## 2021-07-01 NOTE — Progress Notes (Signed)
EEG complete - results pending 

## 2021-07-01 NOTE — Assessment & Plan Note (Addendum)
-   Close to baseline.  See MRI as below.  EEG unremarkable

## 2021-07-02 ENCOUNTER — Inpatient Hospital Stay (HOSPITAL_COMMUNITY): Payer: Medicaid Other

## 2021-07-02 ENCOUNTER — Encounter (HOSPITAL_COMMUNITY): Payer: Self-pay | Admitting: Internal Medicine

## 2021-07-02 DIAGNOSIS — I6381 Other cerebral infarction due to occlusion or stenosis of small artery: Secondary | ICD-10-CM

## 2021-07-02 LAB — BASIC METABOLIC PANEL
Anion gap: 11 (ref 5–15)
BUN: 15 mg/dL (ref 8–23)
CO2: 26 mmol/L (ref 22–32)
Calcium: 9.1 mg/dL (ref 8.9–10.3)
Chloride: 101 mmol/L (ref 98–111)
Creatinine, Ser: 0.65 mg/dL (ref 0.44–1.00)
GFR, Estimated: >60 mL/min (ref >60)
Glucose, Bld: 118 mg/dL — ABNORMAL HIGH (ref 70–99)
Potassium: 4.4 mmol/L (ref 3.5–5.1)
Sodium: 138 mmol/L (ref 135–145)

## 2021-07-02 LAB — RESP PANEL BY RT-PCR (FLU A&B, COVID) ARPGX2
Influenza A by PCR: NEGATIVE
Influenza B by PCR: NEGATIVE
SARS Coronavirus 2 by RT PCR: NEGATIVE

## 2021-07-02 LAB — GLUCOSE, CAPILLARY
Glucose-Capillary: 110 mg/dL — ABNORMAL HIGH (ref 70–99)
Glucose-Capillary: 112 mg/dL — ABNORMAL HIGH (ref 70–99)
Glucose-Capillary: 113 mg/dL — ABNORMAL HIGH (ref 70–99)
Glucose-Capillary: 119 mg/dL — ABNORMAL HIGH (ref 70–99)
Glucose-Capillary: 119 mg/dL — ABNORMAL HIGH (ref 70–99)
Glucose-Capillary: 121 mg/dL — ABNORMAL HIGH (ref 70–99)

## 2021-07-02 IMAGING — MR MR HEAD W/O CM
7 of 10 series · 37 of 48 positions shown · non-contrast
Comparison: Brain MRI [DATE].

CLINICAL DATA: 60-year-old female with altered mental status.
Atrial fibrillation

EXAM:
MRI HEAD WITHOUT CONTRAST
TECHNIQUE: Multiplanar, multiecho pulse sequences of the brain and surrounding
structures were obtained without intravenous contrast.

[Series 3: DWI · axial · 3.0mm · 1.09mm/px · z∈[-47,+99]mm · 9 of 104 slices shown (1 of 4)]
[im 1/104]
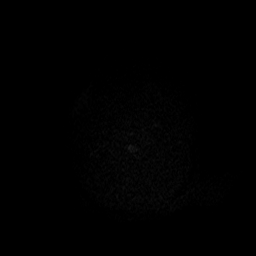
[im 19/104]
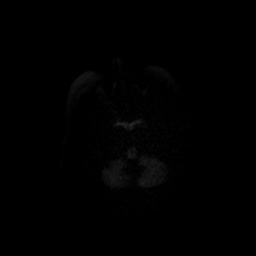
[im 29/104]
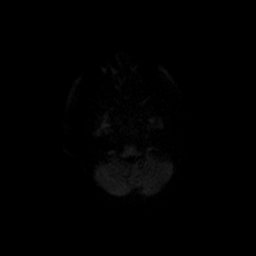
[im 47/104]
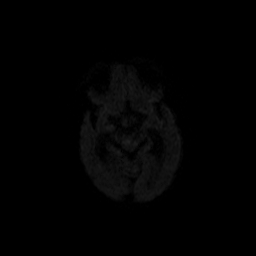
[im 57/104]
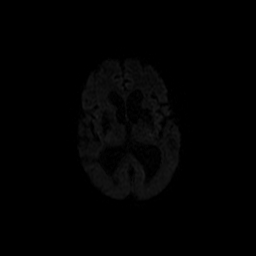
[im 75/104]
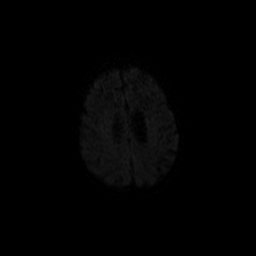
[im 85/104]
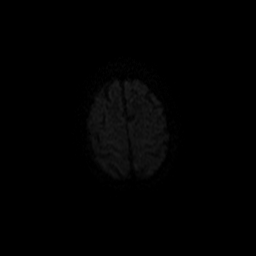
[im 94/104]
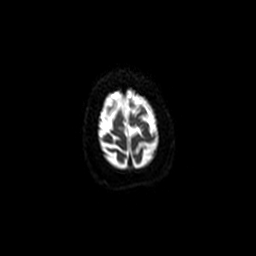
[im 104/104]
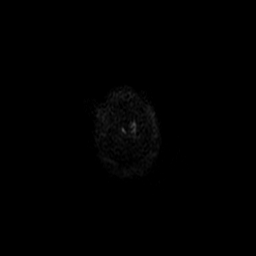

[Series 4: DWI · coronal · 5.0mm · 1.09mm/px · 8 of 70 slices shown (2 of 4)]
[im 1/70]
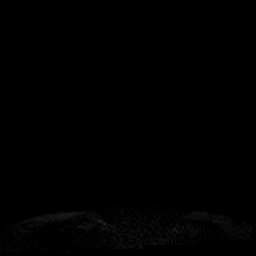
[im 10/70]
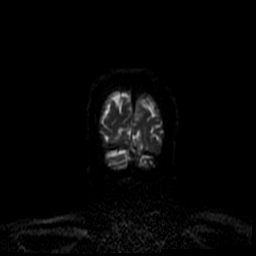
[im 20/70]
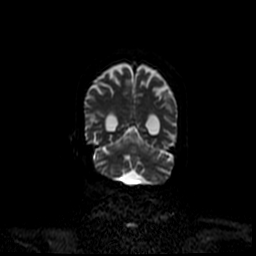
[im 30/70]
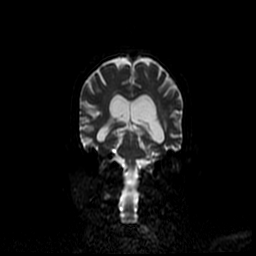
[im 40/70]
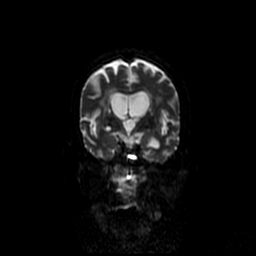
[im 50/70]
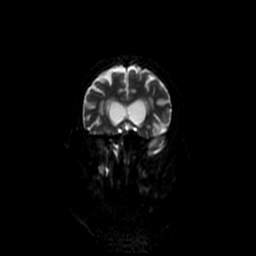
[im 60/70]
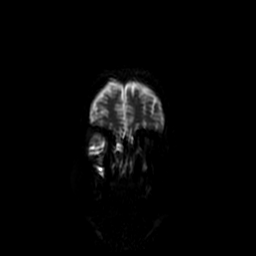
[im 70/70]
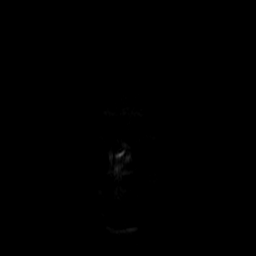

[Series 6: FLAIR · axial · 5.0mm · 0.43mm/px · z∈[-57,+87]mm · 3 of 26 slices shown]
[im 1/26]
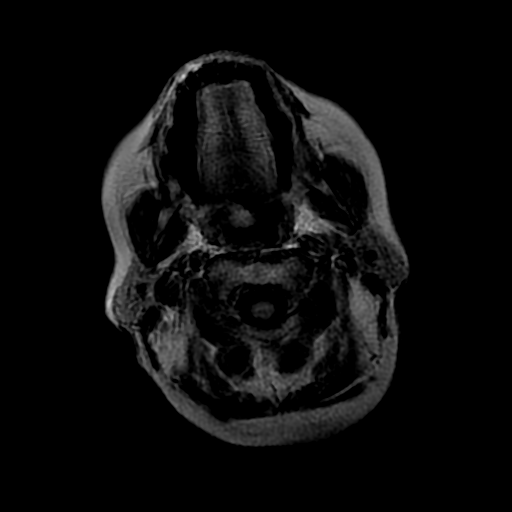
[im 13/26]
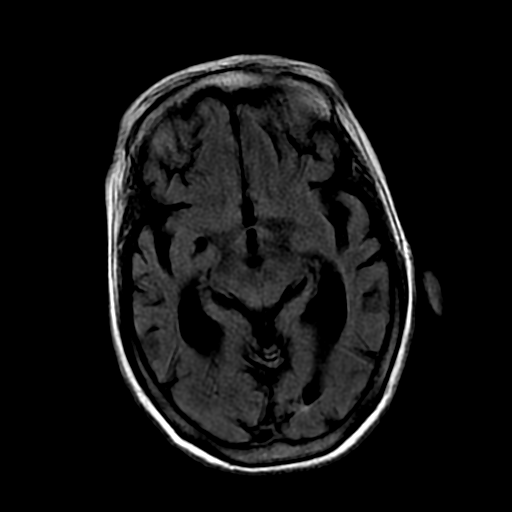
[im 26/26]
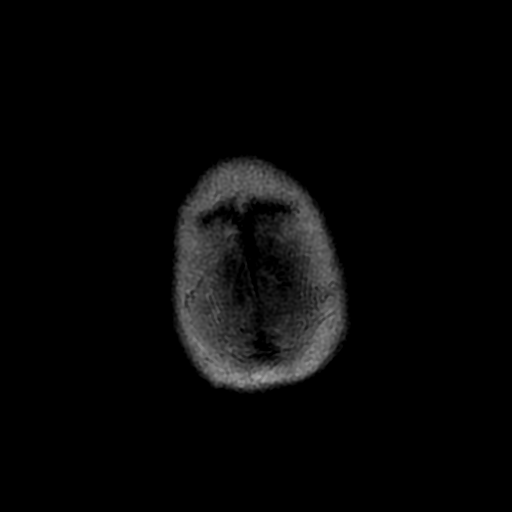

[Series 9: T2 · axial · 5.0mm · 0.43mm/px · z∈[-57,+87]mm · 3 of 26 slices shown (1 of 2)]
[im 1/26]
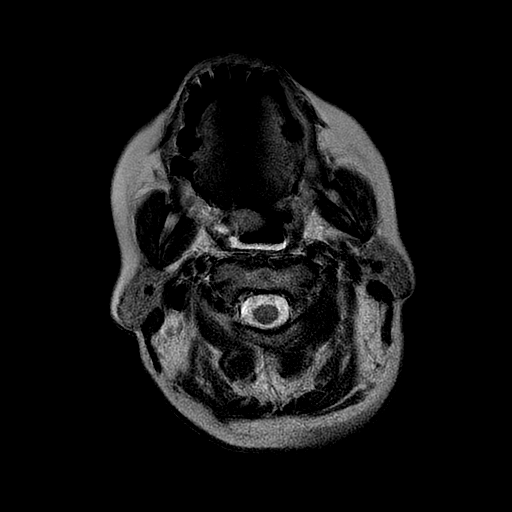
[im 13/26]
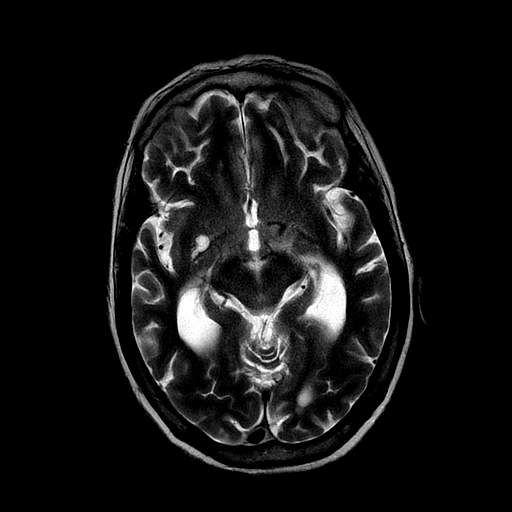
[im 26/26]
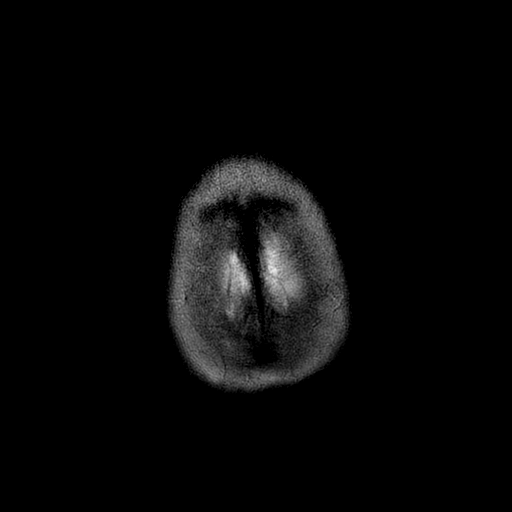

[Series 11: T2 · coronal · 5.0mm · 0.43mm/px · 4 of 30 slices shown (2 of 2)]
[im 1/30]
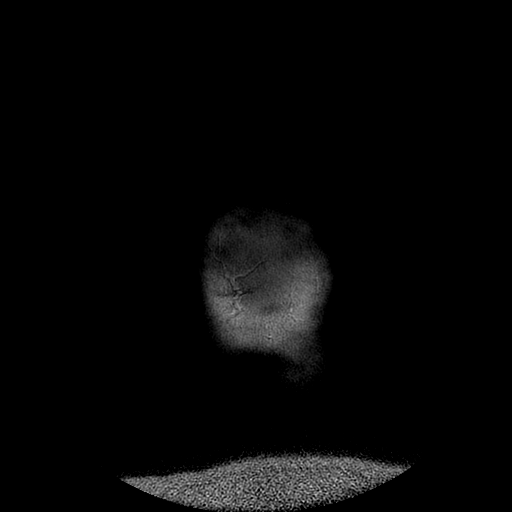
[im 10/30]
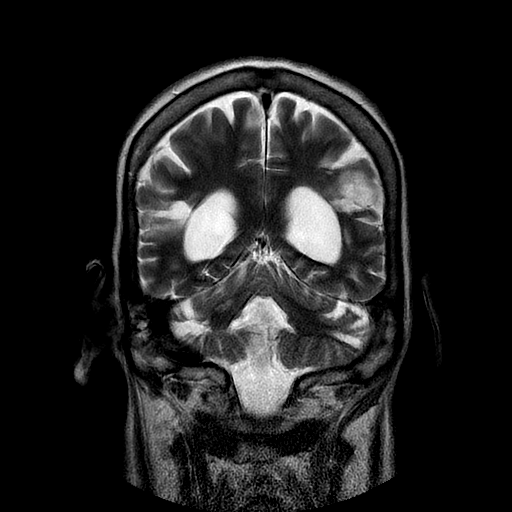
[im 20/30]
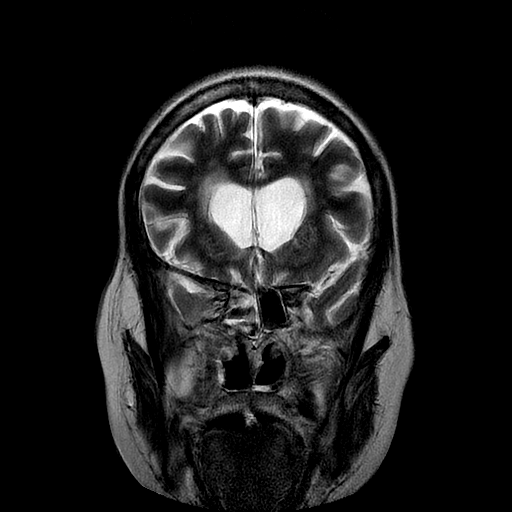
[im 30/30]
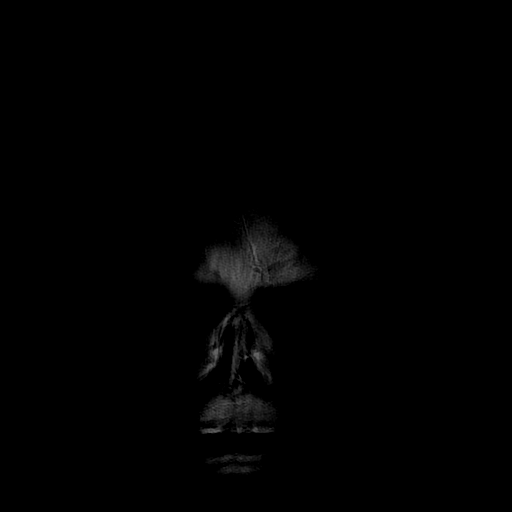

[Series 300: DWI · axial · 3.0mm · 1.09mm/px · z∈[-47,+99]mm · 6 of 52 slices shown (3 of 4)]
[im 1/52]
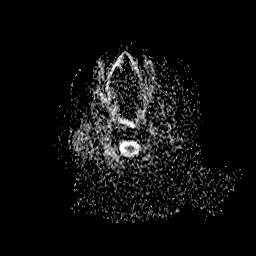
[im 11/52]
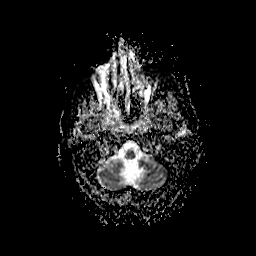
[im 21/52]
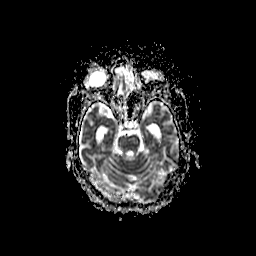
[im 31/52]
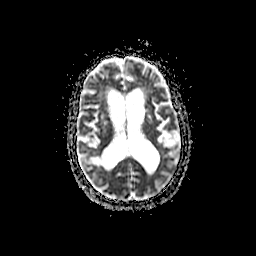
[im 41/52]
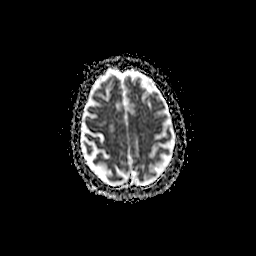
[im 52/52]
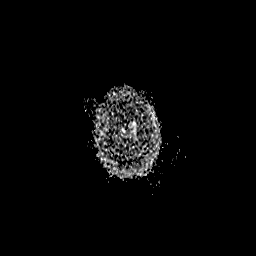

[Series 400: DWI · coronal · 5.0mm · 1.09mm/px · 4 of 35 slices shown (4 of 4)]
[im 1/35]
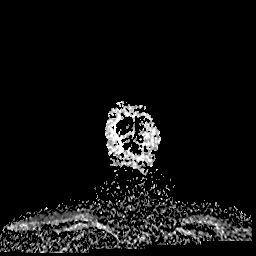
[im 12/35]
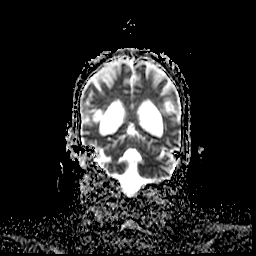
[im 23/35]
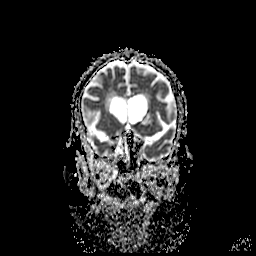
[im 35/35]
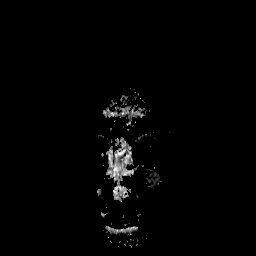

[37 of 48 positions shown; findings below may reference images not displayed]

FINDINGS: Brain: Study is mildly degraded by motion artifact despite repeated
imaging attempts.

Punctate focus of restricted diffusion along the right caudate
nucleus, periventricular on series 3, image 32 and series 4, image
21. Underlying extensive bilateral corona radiata and basal ganglia
chronic small vessel disease with multiple chronic lacunar infarcts.
Mild associated hemosiderin. Subtle chronic lacunar infarcts also
suspected in the bilateral thalami greater on the right. And
superimposed bilateral MCA territory cortical encephalomalacia,
mostly posterior division and stable since [REDACTED].

No other restricted diffusion. Ex vacuo ventricular enlargement. No
midline shift, mass effect, evidence of mass lesion,
ventriculomegaly, extra-axial collection or acute intracranial
hemorrhage. Cervicomedullary junction and pituitary are within
normal limits.

Vascular: Major intracranial vascular flow voids are stable.

Skull and upper cervical spine: Negative. Visualized bone marrow
signal is within normal limits.

Sinuses/Orbits: Bilateral phthisis bulbi mild to moderate increased
bilateral paranasal sinus mucosal thickening. Small fluid levels in
the sphenoid sinuses now.

Other: Increased retained secretions in the visible pharynx. Trace
mastoid air cell fluid not significantly changed.
IMPRESSION: Severe chronic small vessel disease and previous bilateral MCA
territory ischemia, with a superimposed punctate acute lacunar
infarct in the right caudate nucleus. No acute hemorrhage or mass
effect.

## 2021-07-02 NOTE — Progress Notes (Signed)
Pt not in room, not available

## 2021-07-02 NOTE — Plan of Care (Signed)

## 2021-07-02 NOTE — Progress Notes (Signed)
PROGRESS NOTE  Jill Buchanan [id number] DOB: 12-16-1960 DOA: 06/25/2021 PCP: Pcp, No   LOS: 7 days   Brief Narrative / Interim history: 61 year old F, SNF resident with history of multiple strokes, afib on anticoagulation, CAD s/p distant CABG, AVR, Anemia and previous GI bleeds. Pt presented to [facility] ER with reports of poor intake and low oxygen saturations. The patient lives in a SNF due to severe debility in the setting of multiple strokes.  She also has a hx of anemia, GIB, AF on anticoagulation. In December 2022, she was admitted to the hospital and seen by Palliative Care and was a DNR (on current admission this was confirmed by nursing home paperwork and sister by phone).  Subjective / 24h Interval events: -Tells me "good morning" but that is about it  Assessment & Plan: Aspiration pneumonia ([place])- (present on admission) -Patient with L-sided PNA in the setting of prior recurrent strokes and known dysphagia, likely aspiration. On antibiotics  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 (CVA) -Has PEG tube and receives tube feeds. However, according to her daughter, she has also been taking some pureed liquids and speech may have given thin liquids this week; she was planned for an invasive swallow evaluation soon  CAD, multiple vessel- (present on admission) -s/p CABG. No chest pain, stable  Atrial fibrillation, chronic ([place])- (present on admission) -Continue Coreg, Eliquis  Acute lacunar stroke [facility]) - Patient underwent a repeat MRI of the brain this morning as she was not very interactive yesterday which showed a new acute punctate lacunar CVA in addition to all her prior CVAs.  She was just hospitalized and had a full work-up for CVA, she is currently on maximal therapy with Eliquis, aspirin, high-dose statin.  Case discussed with Dr. Theda Sers with neurology who is in agreement  Acute metabolic encephalopathy - Close to baseline.  See MRI as below.  EEG  unremarkable  Hypomagnesemia -monitor  Hypochloremia -monitor  Hypernatremia -monitor  UTI (urinary tract infection) due to VRE -On Linezolid  HTN (hypertension) -monitor BP  AKI (acute kidney injury) ([place]) -Cr normalized with fluids  Goals of care, counseling/discussion -no CPR but do intubate  Anemia- (present on admission) -Unclear etiology, status post 2 unit PRBC at intake. History of GI bleed continues on anticoagulation but no signs or symptoms of blood loss at this time. Hemoglobin stable, most recent one 9.8.  Continue to monitor  Recurrent cerebrovascular accidents (CVAs) ([place])- (present on admission) -Multiple prior strokes   Scheduled Meds:  apixaban  5 mg Per Tube BID   carvedilol  25 mg Per Tube BID   chlorhexidine  15 mL Mouth Rinse BID   Chlorhexidine Gluconate Cloth  6 each Topical Daily   feeding supplement (PROSource TF)  45 mL Per Tube Daily   free water  300 mL Per Tube Q4H   insulin aspart  0-9 Units Subcutaneous Q4H   linezolid  600 mg Per Tube Q12H   mouth rinse  15 mL Mouth Rinse q12n4p   multivitamin with minerals  1 tablet Per Tube Daily   pantoprazole sodium  40 mg Per Tube Daily   sodium chloride flush  3 mL Intravenous Q12H   Continuous Infusions:  sodium chloride Stopped (06/26/21 0645)   feeding supplement (OSMOLITE 1.2 CAL) 1,000 mL (07/01/21 0948)   PRN Meds:.sodium chloride, acetaminophen **OR** acetaminophen, ipratropium-albuterol, ondansetron **OR** ondansetron (ZOFRAN) IV, white petrolatum  Diet Orders (From admission, onward)     Start     Ordered   06/25/21  0337  Diet NPO time specified  (Septic presentation on arrival (screening labs, nursing and treatment orders for obvious sepsis))  Diet effective now        06/25/21 [id number]            DVT prophylaxis: Place and maintain sequential compression device Start: 06/25/21 1059 apixaban (ELIQUIS) tablet 5 mg   Lab Results  Component Value Date   PLT 185 07/01/2021       Code Status: Partial Code  Family Communication: We will call daughter later  Status is: Inpatient  Remains inpatient appropriate because: Persistent encephalopathy  Level of care: Med-Surg  Consultants:  Critical care  Procedures:  none  Microbiology  Urine culture 1/27-VRE   Antimicrobials: Linezolid, Unasyn    Objective: Vitals:   07/02/21 0024 07/02/21 0452 07/02/21 0500 07/02/21 0746  BP: (!) 132/58 102/66  (!) 147/66  Pulse: 63 64  68  Resp: 18 17  18   Temp: 97.8 F (36.6 C) 98.3 F (36.8 C)  97.7 F (36.5 C)  TempSrc: Oral Oral  Oral  SpO2: 100% 95%  98%  Weight:   50.2 kg   Height:        Intake/Output Summary (Last 24 hours) at 07/02/2021 1217 Last data filed at 07/02/2021 1113 Gross per 24 hour  Intake 660 ml  Output 650 ml  Net 10 ml    Wt Readings from Last 3 Encounters:  07/02/21 50.2 kg  05/02/21 51.1 kg    Examination:  Constitutional: NAD ENMT: mmm Neck: normal, supple Respiratory: clear to auscultation bilaterally, no wheezing, no crackles. Normal respiratory effort.  Cardiovascular: Regular rate and rhythm, no murmurs / rubs / gallops. No LE edema. Abdomen: soft, no distention, no tenderness. Bowel sounds positive.  Skin: no rashes Neurologic: Does not follow commands but withdraws equally   Data Reviewed: I have independently reviewed following labs and imaging studies  CBC Recent Labs  Lab 06/25/21 1820 06/26/21 0351 06/27/21 0540 06/29/21 0620 07/01/21 0625  WBC 21.1* 20.0* 17.8* 14.3* 12.3*  HGB 9.3* 9.1* 8.7* 9.8* 9.4*  HCT 28.3* 27.7* 27.0* 31.3* 30.2*  PLT 160 140* 125* 121* 185  MCV 86.5 85.5 85.7 85.3 86.0  MCH 28.4 28.1 27.6 26.7 26.8  MCHC 32.9 32.9 32.2 31.3 31.1  RDW 15.5 15.6* 15.3 14.9 15.6*  LYMPHSABS 1.7  --   --   --   --   MONOABS 0.8  --   --   --   --   EOSABS 0.0  --   --   --   --   BASOSABS 0.0  --   --   --   --      Recent Labs  Lab 06/25/21 1431 06/25/21 1505 06/26/21 0351  06/26/21 0900 06/27/21 0540 06/29/21 0620 06/30/21 0643 07/01/21 0625 07/02/21 0602  NA  --   --    < >  --  139 141 138 137 138  K  --   --    < >  --  3.2* 3.3* 5.2* 4.5 4.4  CL  --   --    < >  --  110 107 106 103 101  CO2  --   --    < >  --  20* 22 23 25 26   GLUCOSE  --   --    < >  --  97 106* 117* 122* 118*  BUN  --   --    < >  --  26*  13 12 14 15   CREATININE  --   --    < >  --  0.82 0.85 0.72 0.66 0.65  CALCIUM  --   --    < >  --  8.3* 8.6* 8.8* 8.9 9.1  AST  --   --   --   --  28  --   --  17  --   ALT  --   --   --   --  22  --   --  15  --   ALKPHOS  --   --   --   --  61  --   --  57  --   BILITOT  --   --   --   --  1.1  --   --  0.4  --   ALBUMIN  --   --   --   --  2.2*  --   --  2.0*  --   MG  --   --   --  2.0 1.7  --   --   --   --   PROCALCITON  --  22.01  --   --   --   --   --   --   --   LATICACIDVEN 4.6*  --   --   --   --   --   --   --   --    < > = values in this interval not displayed.     ------------------------------------------------------------------------------------------------------------------ No results for input(s): CHOL, HDL, LDLCALC, TRIG, CHOLHDL, LDLDIRECT in the last 72 hours.  Lab Results  Component Value Date   HGBA1C 4.9 04/29/2021   ------------------------------------------------------------------------------------------------------------------ No results for input(s): TSH, T4TOTAL, T3FREE, THYROIDAB in the last 72 hours.  Invalid input(s): FREET3  Cardiac Enzymes No results for input(s): CKMB, TROPONINI, MYOGLOBIN in the last 168 hours.  Invalid input(s): CK ------------------------------------------------------------------------------------------------------------------ No results found for: BNP  CBG: Recent Labs  Lab 07/01/21 2058 07/02/21 0022 07/02/21 0506 07/02/21 0742 07/02/21 1145  GLUCAP 124* 121* 110* 119* 112*     Recent Results (from the past 240 hour(s))  Resp Panel by RT-PCR (Flu A&B, Covid)  Nasopharyngeal Swab     Status: None   Collection Time: 06/25/21  3:37 AM   Specimen: Nasopharyngeal Swab; Nasopharyngeal(NP) swabs in vial transport medium  Result Value Ref Range Status   SARS Coronavirus 2 by RT PCR NEGATIVE NEGATIVE Final    Comment: (NOTE) SARS-CoV-2 target nucleic acids are NOT DETECTED.  The SARS-CoV-2 RNA is generally detectable in upper respiratory specimens during the acute phase of infection. The lowest concentration of SARS-CoV-2 viral copies this assay can detect is 138 copies/mL. A negative result does not preclude SARS-Cov-2 infection and should not be used as the sole basis for treatment or other patient management decisions. A negative result may occur with  improper specimen collection/handling, submission of specimen other than nasopharyngeal swab, presence of viral mutation(s) within the areas targeted by this assay, and inadequate number of viral copies(<138 copies/mL). A negative result must be combined with clinical observations, patient history, and epidemiological information. The expected result is Negative.  Fact Sheet for Patients:  EntrepreneurPulse.com.au  Fact Sheet for Healthcare Providers:  IncredibleEmployment.be  This test is no t yet approved or cleared by the [place] FDA and  has been authorized for detection and/or diagnosis of SARS-CoV-2 by FDA under an Emergency Use Authorization (EUA). This EUA will remain  in effect (  meaning this test can be used) for the duration of the COVID-19 declaration under Section 564(b)(1) of the Act, 21 U.S.C.section 360bbb-3(b)(1), unless the authorization is terminated  or revoked sooner.       Influenza A by PCR NEGATIVE NEGATIVE Final   Influenza B by PCR NEGATIVE NEGATIVE Final    Comment: (NOTE) The Xpert Xpress SARS-CoV-2/FLU/RSV plus assay is intended as an aid in the diagnosis of influenza from Nasopharyngeal swab specimens and should not be  used as a sole basis for treatment. Nasal washings and aspirates are unacceptable for Xpert Xpress SARS-CoV-2/FLU/RSV testing.  Fact Sheet for Patients: EntrepreneurPulse.com.au  Fact Sheet for Healthcare Providers: IncredibleEmployment.be  This test is not yet approved or cleared by the [place] FDA and has been authorized for detection and/or diagnosis of SARS-CoV-2 by FDA under an Emergency Use Authorization (EUA). This EUA will remain in effect (meaning this test can be used) for the duration of the COVID-19 declaration under Section 564(b)(1) of the Act, 21 U.S.C. section 360bbb-3(b)(1), unless the authorization is terminated or revoked.  Performed at [facility], [place] [address], [place], [place] [address]   Blood Culture (routine x 2)     Status: None   Collection Time: 06/25/21  3:37 AM   Specimen: BLOOD RIGHT ARM  Result Value Ref Range Status   Specimen Description BLOOD RIGHT ARM  Final   Special Requests   Final    AEROBIC BOTTLE ONLY Blood Culture results may not be optimal due to an inadequate volume of blood received in culture bottles   Culture   Final    NO GROWTH 5 DAYS Performed at [facility], [place] [address], [place], [place] [address]    Report Status 06/30/2021 FINAL  Final  Blood Culture (routine x 2)     Status: None   Collection Time: 06/25/21  3:42 AM   Specimen: BLOOD LEFT ARM  Result Value Ref Range Status   Specimen Description BLOOD LEFT ARM  Final   Special Requests   Final    BOTTLES DRAWN AEROBIC AND ANAEROBIC Blood Culture adequate volume   Culture   Final    NO GROWTH 5 DAYS Performed at [facility], [place] [address], [place], [place] [address]    Report Status 06/30/2021 FINAL  Final  Urine Culture     Status: Abnormal   Collection Time: 06/25/21  8:38 AM   Specimen: In/Out Cath Urine  Result Value Ref Range Status   Specimen Description IN/OUT CATH URINE  Final   Special  Requests   Final    NONE Performed at [facility], [place] [address], [place],  [address]    Culture (A)  Final    >=100,000 COLONIES/mL VANCOMYCIN RESISTANT ENTEROCOCCUS   Report Status 06/27/2021 FINAL  Final   Organism ID, Bacteria VANCOMYCIN RESISTANT ENTEROCOCCUS (A)  Final      Susceptibility   Vancomycin resistant enterococcus - MIC*    AMPICILLIN >=32 RESISTANT Resistant     NITROFURANTOIN 128 RESISTANT Resistant     VANCOMYCIN >=32 RESISTANT Resistant     LINEZOLID 2 SENSITIVE Sensitive     * >=100,000 COLONIES/mL VANCOMYCIN RESISTANT ENTEROCOCCUS  MRSA Next Gen by PCR, Nasal     Status: None   Collection Time: 06/27/21  8:45 PM   Specimen: Nasal Mucosa; Nasal Swab  Result Value Ref Range Status   MRSA by PCR Next Gen NOT DETECTED NOT DETECTED Final    Comment: (  NOTE) The GeneXpert MRSA Assay (FDA approved for NASAL specimens only), is one component of a comprehensive MRSA colonization surveillance program. It is not intended to diagnose MRSA infection nor to guide or monitor treatment for MRSA infections. Test performance is not FDA approved in patients less than 46 years old. Performed at [facility], [place] [address], [place],  [address]       Radiology Studies: DG Skull 1-3 Views  Result Date: 07/01/2021 CLINICAL DATA:  Evaluate for possible foreign body EXAM: SKULL - 1-3 VIEW COMPARISON:  04/27/2021 FINDINGS: No acute bony abnormality is noted. No radiopaque foreign body is seen. Calcified globes are noted bilaterally stable in appearance from prior CT. IMPRESSION: No radiopaque foreign body is noted. Electronically Signed   By: Inez Catalina M.D.   On: 07/01/2021 20:25   MR BRAIN WO CONTRAST  Result Date: 07/02/2021 CLINICAL DATA:  61 year old female with altered mental status. Atrial fibrillation EXAM: MRI HEAD WITHOUT CONTRAST TECHNIQUE: Multiplanar, multiecho pulse sequences of the brain and surrounding structures were obtained without  intravenous contrast. COMPARISON:  Brain MRI 04/28/2021. FINDINGS: Brain: Study is mildly degraded by motion artifact despite repeated imaging attempts. Punctate focus of restricted diffusion along the right caudate nucleus, periventricular on series 3, image 32 and series 4, image 21. Underlying extensive bilateral corona radiata and basal ganglia chronic small vessel disease with multiple chronic lacunar infarcts. Mild associated hemosiderin. Subtle chronic lacunar infarcts also suspected in the bilateral thalami greater on the right. And superimposed bilateral MCA territory cortical encephalomalacia, mostly posterior division and stable since November. No other restricted diffusion. Ex vacuo ventricular enlargement. No midline shift, mass effect, evidence of mass lesion, ventriculomegaly, extra-axial collection or acute intracranial hemorrhage. Cervicomedullary junction and pituitary are within normal limits. Vascular: Major intracranial vascular flow voids are stable. Skull and upper cervical spine: Negative. Visualized bone marrow signal is within normal limits. Sinuses/Orbits: Bilateral phthisis bulbi mild to moderate increased bilateral paranasal sinus mucosal thickening. Small fluid levels in the sphenoid sinuses now. Other: Increased retained secretions in the visible pharynx. Trace mastoid air cell fluid not significantly changed. IMPRESSION: Severe chronic small vessel disease and previous bilateral MCA territory ischemia, with a superimposed punctate acute lacunar infarct in the right caudate nucleus. No acute hemorrhage or mass effect. Electronically Signed   By: Genevie Ann M.D.   On: 07/02/2021 09:56   DG CHEST PORT 1 VIEW  Result Date: 07/01/2021 CLINICAL DATA:  MRI clearance. EXAM: PORTABLE CHEST 1 VIEW, abdomen portable. COMPARISON:  04/29/2019 FINDINGS: The heart is enlarged and there is evidence of prior cardiothoracic surgery. 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 Atherosclerotic  calcification of the aorta is noted. An opacity is noted at the left costophrenic angle which is unchanged from multiple prior exams. The right lung is clear. No pneumothorax. No acute osseous abnormality. There is a nonobstructive bowel-gas pattern. A tubular structure with balloon is terminates over the mid left abdomen, possible feeding tube. No radiopaque foreign body is identified. Suture material is noted in the pelvis. No acute osseous abnormality. IMPRESSION: 1. Stable opacity at the left costophrenic angle, may represent a prominent epicardial fat pad versus atelectasis, infiltrate, and/or effusion. 2. Evidence of prior cardiothoracic surgery with prosthetic cardiac valves. 3. Nonobstructive bowel-gas pattern. 4. Tubular structure with balloon terminating over the mid left abdomen. Correlate clinically for feeding tube. Electronically Signed   By: Brett Fairy M.D.   On: 07/01/2021 20:30   DG Abd Portable 1V  Result Date: 07/01/2021 CLINICAL  DATA:  MRI clearance. EXAM: PORTABLE CHEST 1 VIEW, abdomen portable. COMPARISON:  04/29/2019 FINDINGS: The heart is enlarged and there is evidence of prior cardiothoracic surgery. 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 Atherosclerotic calcification of the aorta is noted. An opacity is noted at the left costophrenic angle which is unchanged from multiple prior exams. The right lung is clear. No pneumothorax. No acute osseous abnormality. There is a nonobstructive bowel-gas pattern. A tubular structure with balloon is terminates over the mid left abdomen, possible feeding tube. No radiopaque foreign body is identified. Suture material is noted in the pelvis. No acute osseous abnormality. IMPRESSION: 1. Stable opacity at the left costophrenic angle, may represent a prominent epicardial fat pad versus atelectasis, infiltrate, and/or effusion. 2. Evidence of prior cardiothoracic surgery with prosthetic cardiac valves. 3. Nonobstructive bowel-gas pattern. 4.  Tubular structure with balloon terminating over the mid left abdomen. Correlate clinically for feeding tube. Electronically Signed   By: Brett Fairy M.D.   On: 07/01/2021 20:30   EEG adult  Result Date: 07/01/2021 Lora Havens, MD     07/01/2021  3:28 PM Patient Name: Bandy Torrens MRN: [id number] Epilepsy Attending: Lora Havens Referring Physician/Provider: Caren Griffins Date: 07/01/2021 Duration: 21.45 mins  Patient history: 61 year old female with prior strokes with altered mental status.  EEG to evaluate for seizures.  Level of alertness: Awake  AEDs during EEG study: None  Technical aspects: This EEG study was done with scalp electrodes positioned according to the 10-20 International system of electrode placement. Electrical activity was acquired at a sampling rate of 500Hz  and reviewed with a high frequency filter of 70Hz  and a low frequency filter of 1Hz . EEG data were recorded continuously and digitally stored.  Description: No clear posterior dominant rhythm was seen. EEG showed continuous generalized 3 to 5 Hz theta-delta slowing. Hyperventilation and photic stimulation were not performed.    ABNORMALITY - Continuous slow, generalized  IMPRESSION: This study is suggestive of moderate diffuse encephalopathy, nonspecific etiology.  No seizures or definite epileptiform discharges were seen throughout the recording.  Priyanka Mont Dutton, MD, PhD [facility]  Between 7 am - 7 pm I am available, please contact me via Amion (for emergencies) or Securechat (non urgent messages)  Between 7 pm - 7 am I am not available, please contact night coverage MD/APP via Amion

## 2021-07-02 NOTE — Progress Notes (Signed)
Pt currently asleep and lungs sound clear and diminished. RT to withhold CPT for the night. RT will continue to monitor as needed.

## 2021-07-02 NOTE — Assessment & Plan Note (Addendum)
-   Patient underwent a repeat MRI of the brain as she was not very interactive on 2/2, which showed a new acute punctate lacunar CVA in addition to all her prior CVAs.  She was just hospitalized and had a full work-up for CVA, she is currently on maximal therapy with Eliquis, aspirin, high-dose statin.  Case discussed with Dr. Thomasena Edis with neurology who is in agreement

## 2021-07-02 NOTE — TOC Progression Note (Signed)
Transition of Care [facility]) - Progression Note    Patient Details  Name: Jill Buchanan MRN: [id number] Date of Birth: Oct 07, 1960  Transition of [facility]) CM/SW Contact  Baldemar Lenis, [place] Phone Number: 07/02/2021, 3:07 PM  Clinical Narrative:   CSW alerted by MD that patient may be ready to return to SNF over the weekend. CSW spoke with Crystal at [facility], patient can return. CSW to send discharge summary with the patient in transport packet. CSW to follow.    Expected Discharge Plan: Long Term Nursing Home Barriers to Discharge: Continued Medical Work up  Expected Discharge Plan and Services Expected Discharge Plan: Long Term Nursing Home     Post Acute Care Choice:  (TBD-pending PT recs) Living arrangements for the past 2 months: Skilled Nursing Facility [facility] [place])                                       Social Determinants of Health (SDOH) Interventions    Readmission Risk Interventions No flowsheet data found.

## 2021-07-03 LAB — CBC
HCT: 29.9 % — ABNORMAL LOW (ref 36.0–46.0)
Hemoglobin: 9.5 g/dL — ABNORMAL LOW (ref 12.0–15.0)
MCH: 27.1 pg (ref 26.0–34.0)
MCHC: 31.8 g/dL (ref 30.0–36.0)
MCV: 85.4 fL (ref 80.0–100.0)
Platelets: 225 10*3/uL (ref 150–400)
RBC: 3.5 MIL/uL — ABNORMAL LOW (ref 3.87–5.11)
RDW: 15.3 % (ref 11.5–15.5)
WBC: 11.5 10*3/uL — ABNORMAL HIGH (ref 4.0–10.5)
nRBC: 0 % (ref 0.0–0.2)

## 2021-07-03 LAB — GLUCOSE, CAPILLARY
Glucose-Capillary: 113 mg/dL — ABNORMAL HIGH (ref 70–99)
Glucose-Capillary: 118 mg/dL — ABNORMAL HIGH (ref 70–99)
Glucose-Capillary: 119 mg/dL — ABNORMAL HIGH (ref 70–99)
Glucose-Capillary: 121 mg/dL — ABNORMAL HIGH (ref 70–99)
Glucose-Capillary: 92 mg/dL (ref 70–99)

## 2021-07-03 LAB — COMPREHENSIVE METABOLIC PANEL
ALT: 14 U/L (ref 0–44)
AST: 16 U/L (ref 15–41)
Albumin: 2.2 g/dL — ABNORMAL LOW (ref 3.5–5.0)
Alkaline Phosphatase: 62 U/L (ref 38–126)
Anion gap: 12 (ref 5–15)
BUN: 21 mg/dL (ref 8–23)
CO2: 20 mmol/L — ABNORMAL LOW (ref 22–32)
Calcium: 9 mg/dL (ref 8.9–10.3)
Chloride: 103 mmol/L (ref 98–111)
Creatinine, Ser: 0.72 mg/dL (ref 0.44–1.00)
GFR, Estimated: >60 mL/min (ref >60)
Glucose, Bld: 121 mg/dL — ABNORMAL HIGH (ref 70–99)
Potassium: 4.4 mmol/L (ref 3.5–5.1)
Sodium: 135 mmol/L (ref 135–145)
Total Bilirubin: 0.2 mg/dL — ABNORMAL LOW (ref 0.3–1.2)
Total Protein: 6.5 g/dL (ref 6.5–8.1)

## 2021-07-03 NOTE — TOC Transition Note (Addendum)
Transition of Care [facility]) - CM/SW Discharge Note   Patient Details  Name: Jill Buchanan MRN: [id number] Date of Birth: [birth year]  Transition of Care [facility]) CM/SW Contact:  Levada Schilling Phone Number: 07/03/2021, 12:29 PM   Clinical Narrative:     Patient will Discharge To: [facility] Anticipated DC Date:07/03/21 Family Notified:yes, Sherrin Daisy, [phone] Transport FF:[id number]   Per MD patient ready for DC to  [facility]. RN, patient, patient's family, and facility notified of DC. Assessment, Fl2/Pasrr, and Discharge Summary sent with pt per [facility]. RN given number for report [phone], [address] DC packet on chart. Ambulance transport requested for patient for 2:00pm  CSW signing off.  Budd Palmer [facility] [phone]    Final next level of care: Skilled Nursing Facility Barriers to Discharge: No Barriers Identified   Patient Goals and CMS Choice        Discharge Placement              Patient chooses bed at: [facility] Patient to be transferred to facility by: [facility] Name of family member notified: Sherrin Daisy Patient and family notified of of transfer: 07/03/21  Discharge Plan and Services     Post Acute Care Choice:  (TBD-pending PT recs)                               Social Determinants of Health (SDOH) Interventions     Readmission Risk Interventions No flowsheet data found.

## 2021-07-03 NOTE — Progress Notes (Signed)
Called [place] X [facility] for report but no answer.

## 2021-07-03 NOTE — Discharge Summary (Signed)
Physician Discharge Summary  Jill Buchanan [id number] DOB: 11-Feb-1961 DOA: 06/25/2021  PCP: Pcp, No  Admit date: 06/25/2021 Discharge date: 07/03/2021  Admitted From: SNF Disposition:  SNF  Recommendations for Outpatient Follow-up:  Follow up with PCP in 1-2 weeks Please obtain BMP/CBC in one week  Home Health: none Equipment/Devices: none  Discharge Condition: stable CODE STATUS: Full  Diet recommendation: PEG tube  HPI: Per admitting MD, Jill Buchanan is a 61 y.o. female with medical history significant of CVA with chronic debility; CAD s/p CABG; dysphagia with PEG tube; and afib presenting with SOB. She was previously admitted from 11/29-12/4 with acute hypoxic respiratory failure associated with COVID-19 infection and was treated with Remdesivir and Decadron.  She had additional strokes during her prior hospitalization with Xarelto failure and so was changed to Eliquis and ASA.  She had concern for GI bleeding and was transfused 2 units PRBC but EGD was deferred at that time.  Palliative care consulted and the patient was made DNR with plan for outpatient palliative care discussions ongoing. I spoke with her daughter.  Her daughter reports that she is en route.  She was able to speak some and doing ok.  She went to SNF and was making improvements but not significantly so.  They were planning to come see her next week - speech wanted to do a swallow evaluation.  Last night, they called to report the need for transfer to the hospital.  She will be at the 72-month mark from her stroke is in March and her daughter "wants to give her a fair fighting chance."  She would want her to be intubated to give her a chance.  She has been on pureed thick but may have tried to give her thin liquids.  Her daughter would like have a head CT for comparison.    Hospital Course / Discharge diagnoses: Aspiration pneumonia ([place])- (present on admission) -Patient with L-sided PNA in the setting of prior  recurrent strokes and known dysphagia, likely aspiration. She is now s/p complete course of antibiotics, stable, on room air, afebrile. Continue aspiration precautions  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 (CVA) -Has PEG tube and receives tube feeds. However, according to her daughter, she has also been taking some pureed liquids and speech may have given thin liquids this week; she was planned for a swallow evaluation soon  CAD, multiple vessel- (present on admission) -s/p CABG. No chest pain, stable  Atrial fibrillation, chronic ([place])- (present on admission) -Continue Coreg, Eliquis  Acute lacunar stroke [facility]) - Patient underwent a repeat MRI of the brain as she was not very interactive on 2/2, which showed a new acute punctate lacunar CVA in addition to all her prior CVAs.  She was just hospitalized and had a full work-up for CVA, she is currently on maximal therapy with Eliquis, aspirin, high-dose statin.  Case discussed with Dr. Theda Sers with neurology who is in agreement  Acute metabolic encephalopathy - Close to baseline.  See MRI as below.  EEG unremarkable  Hypomagnesemia -resolved  Hypochloremia -resolved  Hypernatremia -resolved  UTI (urinary tract infection) due to VRE -completed a course of Linezolid  HTN (hypertension) -monitor BP  AKI (acute kidney injury) ([place]) -Cr normalized with fluids  Goals of care, counseling/discussion -no CPR but do intubate  Anemia- (present on admission) -Unclear etiology, status post 2 unit PRBC at intake. History of GI bleed continues on anticoagulation but no signs or symptoms of blood loss at this time. Hemoglobin stable  Recurrent cerebrovascular accidents (CVAs) ([place])- (present on admission) -Multiple prior strokes    \ Sepsis ruled out   Discharge Instructions   Allergies as of 07/03/2021       Reactions   Eggs Or Egg-derived Products         Medication List     TAKE these medications     acetaminophen 500 MG tablet Commonly known as: TYLENOL Place 1 tablet (500 mg total) into feeding tube every 4 (four) hours as needed for mild pain.   amLODipine 10 MG tablet Commonly known as: NORVASC Place 1 tablet (10 mg total) into feeding tube daily.   apixaban 5 MG Tabs tablet Commonly known as: ELIQUIS Place 1 tablet (5 mg total) into feeding tube 2 (two) times daily.   aspirin 81 MG chewable tablet Place 1 tablet (81 mg total) into feeding tube daily.   atorvastatin 80 MG tablet Commonly known as: LIPITOR Place 1 tablet (80 mg total) into feeding tube daily.   carvedilol 25 MG tablet Commonly known as: COREG Place 1 tablet (25 mg total) into feeding tube 2 (two) times daily.   feeding supplement (OSMOLITE 1.2 CAL) Liqd Place 1,000 mLs into feeding tube continuous.   feeding supplement (PROSource TF) liquid Place 45 mLs into feeding tube daily.   multivitamin with minerals tablet Place 1 tablet into feeding tube daily.   pantoprazole 40 MG tablet Commonly known as: PROTONIX Take 40 mg by mouth daily.   polyethylene glycol 17 g packet Commonly known as: MIRALAX / GLYCOLAX Place 17 g into feeding tube daily as needed for moderate constipation or severe constipation. What changed: when to take this   senna-docusate 8.6-50 MG tablet Commonly known as: Senokot-S Place 1 tablet into feeding tube at bedtime as needed for mild constipation. What changed: when to take this   Zinc Oxide 40 % Pste Apply 1 application topically in the morning and at bedtime. Apply to buttocks every day and night shift         Consultations: none  Procedures/Studies:  DG Skull 1-3 Views  Result Date: 07/01/2021 CLINICAL DATA:  Evaluate for possible foreign body EXAM: SKULL - 1-3 VIEW COMPARISON:  04/27/2021 FINDINGS: No acute bony abnormality is noted. No radiopaque foreign body is seen. Calcified globes are noted bilaterally stable in appearance from prior CT. IMPRESSION: No  radiopaque foreign body is noted. Electronically Signed   By: Inez Catalina M.D.   On: 07/01/2021 20:25   MR BRAIN WO CONTRAST  Result Date: 07/02/2021 CLINICAL DATA:  61 year old female with altered mental status. Atrial fibrillation EXAM: MRI HEAD WITHOUT CONTRAST TECHNIQUE: Multiplanar, multiecho pulse sequences of the brain and surrounding structures were obtained without intravenous contrast. COMPARISON:  Brain MRI 04/28/2021. FINDINGS: Brain: Study is mildly degraded by motion artifact despite repeated imaging attempts. Punctate focus of restricted diffusion along the right caudate nucleus, periventricular on series 3, image 32 and series 4, image 21. Underlying extensive bilateral corona radiata and basal ganglia chronic small vessel disease with multiple chronic lacunar infarcts. Mild associated hemosiderin. Subtle chronic lacunar infarcts also suspected in the bilateral thalami greater on the right. And superimposed bilateral MCA territory cortical encephalomalacia, mostly posterior division and stable since November. No other restricted diffusion. Ex vacuo ventricular enlargement. No midline shift, mass effect, evidence of mass lesion, ventriculomegaly, extra-axial collection or acute intracranial hemorrhage. Cervicomedullary junction and pituitary are within normal limits. Vascular: Major intracranial vascular flow voids are stable. Skull and upper cervical spine: Negative. Visualized bone marrow signal  is within normal limits. Sinuses/Orbits: Bilateral phthisis bulbi mild to moderate increased bilateral paranasal sinus mucosal thickening. Small fluid levels in the sphenoid sinuses now. Other: Increased retained secretions in the visible pharynx. Trace mastoid air cell fluid not significantly changed. IMPRESSION: Severe chronic small vessel disease and previous bilateral MCA territory ischemia, with a superimposed punctate acute lacunar infarct in the right caudate nucleus. No acute hemorrhage or mass  effect. Electronically Signed   By: Genevie Ann M.D.   On: 07/02/2021 09:56   DG CHEST PORT 1 VIEW  Result Date: 07/01/2021 CLINICAL DATA:  MRI clearance. EXAM: PORTABLE CHEST 1 VIEW, abdomen portable. COMPARISON:  04/29/2019 FINDINGS: The heart is enlarged and there is evidence of prior cardiothoracic surgery. 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 Atherosclerotic calcification of the aorta is noted. An opacity is noted at the left costophrenic angle which is unchanged from multiple prior exams. The right lung is clear. No pneumothorax. No acute osseous abnormality. There is a nonobstructive bowel-gas pattern. A tubular structure with balloon is terminates over the mid left abdomen, possible feeding tube. No radiopaque foreign body is identified. Suture material is noted in the pelvis. No acute osseous abnormality. IMPRESSION: 1. Stable opacity at the left costophrenic angle, may represent a prominent epicardial fat pad versus atelectasis, infiltrate, and/or effusion. 2. Evidence of prior cardiothoracic surgery with prosthetic cardiac valves. 3. Nonobstructive bowel-gas pattern. 4. Tubular structure with balloon terminating over the mid left abdomen. Correlate clinically for feeding tube. Electronically Signed   By: Brett Fairy M.D.   On: 07/01/2021 20:30   DG CHEST PORT 1 VIEW  Result Date: 06/26/2021 CLINICAL DATA:  Increased tracheal secretions EXAM: PORTABLE CHEST 1 VIEW COMPARISON:  Yesterday FINDINGS: New hazy density at the right apex. Extensive opacity on the left with some volume loss, similar. Cardiomegaly. AV valve replacements. There could be a left pleural effusion. No pneumothorax. IMPRESSION: Extensive left and new right upper airspace opacity consistent with worsening pneumonia. Electronically Signed   By: Jorje Guild M.D.   On: 06/26/2021 04:40   DG Chest Port 1 View  Result Date: 06/25/2021 CLINICAL DATA:  Shortness of breath and hypotension. EXAM: PORTABLE CHEST 1 VIEW  COMPARISON:  Portable chest 04/27/2021. FINDINGS: Again noted are sternotomy sutures with aortic valve replacement. There is mild central vascular prominence but no findings of edema. Mild cardiomegaly. There is dense opacification of lower half of the left chest, and in the upper half of the left lung there is patchy airspace disease. There is a least a small or moderate-sized underlying left pleural effusion. Findings are worrisome for pneumonia, aspiration, and reactive pleural effusion. Stable mediastinum. There is calcification in the aortic arch. Right lung is clear. Osteopenia. IMPRESSION: On the left, since 04/27/2021 there is new opacification in the lower half of the thorax with patchy airspace disease in the left upper lobe. Findings may suggest pneumonia or aspiration with reactive pleural effusion which could be small to moderate in size. Clinical correlation and radiographic follow-up are recommended. Right lung is clear. Mild cardiomegaly. Electronically Signed   By: Telford Nab M.D.   On: 06/25/2021 04:13   DG Abd Portable 1V  Result Date: 07/01/2021 CLINICAL DATA:  MRI clearance. EXAM: PORTABLE CHEST 1 VIEW, abdomen portable. COMPARISON:  04/29/2019 FINDINGS: The heart is enlarged and there is evidence of prior cardiothoracic surgery. 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 Atherosclerotic calcification of the aorta is noted. An opacity is noted at the left costophrenic angle which is unchanged from  multiple prior exams. The right lung is clear. No pneumothorax. No acute osseous abnormality. There is a nonobstructive bowel-gas pattern. A tubular structure with balloon is terminates over the mid left abdomen, possible feeding tube. No radiopaque foreign body is identified. Suture material is noted in the pelvis. No acute osseous abnormality. IMPRESSION: 1. Stable opacity at the left costophrenic angle, may represent a prominent epicardial fat pad versus atelectasis, infiltrate, and/or  effusion. 2. Evidence of prior cardiothoracic surgery with prosthetic cardiac valves. 3. Nonobstructive bowel-gas pattern. 4. Tubular structure with balloon terminating over the mid left abdomen. Correlate clinically for feeding tube. Electronically Signed   By: Brett Fairy M.D.   On: 07/01/2021 20:30   EEG adult  Result Date: 07/01/2021 Lora Havens, MD     07/01/2021  3:28 PM Patient Name: Farrin Azzarello MRN: [id number] Epilepsy Attending: Lora Havens Referring Physician/Provider: Caren Griffins Date: 07/01/2021 Duration: 21.45 mins  Patient history: 61 year old female with prior strokes with altered mental status.  EEG to evaluate for seizures.  Level of alertness: Awake  AEDs during EEG study: None  Technical aspects: This EEG study was done with scalp electrodes positioned according to the 10-20 International system of electrode placement. Electrical activity was acquired at a sampling rate of 500Hz  and reviewed with a high frequency filter of 70Hz  and a low frequency filter of 1Hz . EEG data were recorded continuously and digitally stored.  Description: No clear posterior dominant rhythm was seen. EEG showed continuous generalized 3 to 5 Hz theta-delta slowing. Hyperventilation and photic stimulation were not performed.    ABNORMALITY - Continuous slow, generalized  IMPRESSION: This study is suggestive of moderate diffuse encephalopathy, nonspecific etiology.  No seizures or definite epileptiform discharges were seen throughout the recording.  Lora Havens    EEG adult  Result Date: 06/25/2021 Lora Havens, MD     06/25/2021 12:07 PM Patient Name: Mitzel Mirante MRN: [id number] Epilepsy Attending: Lora Havens Referring Physician/Provider: Karmen Bongo, MD Date: 06/25/2021 Duration: 21.53 mins Patient history: 61 year old female with prior strokes who presented with altered mental status and tongue trauma.  EEG to evaluate for seizures. Level of alertness: Awake AEDs during EEG  study: None Technical aspects: This EEG study was done with scalp electrodes positioned according to the 10-20 International system of electrode placement. Electrical activity was acquired at a sampling rate of 500Hz  and reviewed with a high frequency filter of 70Hz  and a low frequency filter of 1Hz . EEG data were recorded continuously and digitally stored. Description: No clear posterior dominant rhythm was seen. EEG showed continuous generalized 3 to 5 Hz theta-delta slowing. Generalized periodic discharges with triphasic morphology were also noted at fluctuating frequency of 1 to 2.5 Hz.  Hyperventilation and photic stimulation were not performed.   ABNORMALITY - Periodic discharges with triphasic morphology, generalized ( GPDs) - Continuous slow, generalized IMPRESSION: This study showed generalized periodic discharges with triphasic morphology which is on the ictal-interictal continuum with low to intermediate potential for seizures. Given the morphology and frequency of discharges, this EEG pattern can also be seen due to toxic-metabolic causes like cefepime toxicity, hyperammonemia, hyperuremia. Additionally, EEG suggestive of moderate diffuse encephalopathy, nonspecific etiology.  No seizures were seen throughout the recording. Priyanka Barbra Sarks     Subjective: - no chest pain, shortness of breath, no abdominal pain, nausea or vomiting.   Discharge Exam: BP (!) 151/70 (BP Location: Right Arm)    Pulse 67    Temp 97.7 F (36.5 C) (Oral)  Resp 18    Ht 5\' 3"  (1.6 m)    Wt 52.4 kg    SpO2 95%    BMI 20.46 kg/m   General: Pt is alert, awake, not in acute distress Cardiovascular: RRR, S1/S2 +, no rubs, no gallops Respiratory: CTA bilaterally, no wheezing, no rhonchi Abdominal: Soft, NT, ND, bowel sounds +   The results of significant diagnostics from this hospitalization (including imaging, microbiology, ancillary and laboratory) are listed below for reference.     Microbiology: Recent  Results (from the past 240 hour(s))  Resp Panel by RT-PCR (Flu A&B, Covid) Nasopharyngeal Swab     Status: None   Collection Time: 06/25/21  3:37 AM   Specimen: Nasopharyngeal Swab; Nasopharyngeal(NP) swabs in vial transport medium  Result Value Ref Range Status   SARS Coronavirus 2 by RT PCR NEGATIVE NEGATIVE Final    Comment: (NOTE) SARS-CoV-2 target nucleic acids are NOT DETECTED.  The SARS-CoV-2 RNA is generally detectable in upper respiratory specimens during the acute phase of infection. The lowest concentration of SARS-CoV-2 viral copies this assay can detect is 138 copies/mL. A negative result does not preclude SARS-Cov-2 infection and should not be used as the sole basis for treatment or other patient management decisions. A negative result may occur with  improper specimen collection/handling, submission of specimen other than nasopharyngeal swab, presence of viral mutation(s) within the areas targeted by this assay, and inadequate number of viral copies(<138 copies/mL). A negative result must be combined with clinical observations, patient history, and epidemiological information. The expected result is Negative.  Fact Sheet for Patients:  BloggerCourse.com  Fact Sheet for Healthcare Providers:  SeriousBroker.it  This test is no t yet approved or cleared by the [place] FDA and  has been authorized for detection and/or diagnosis of SARS-CoV-2 by FDA under an Emergency Use Authorization (EUA). This EUA will remain  in effect (meaning this test can be used) for the duration of the COVID-19 declaration under Section 564(b)(1) of the Act, 21 U.S.C.section 360bbb-3(b)(1), unless the authorization is terminated  or revoked sooner.       Influenza A by PCR NEGATIVE NEGATIVE Final   Influenza B by PCR NEGATIVE NEGATIVE Final    Comment: (NOTE) The Xpert Xpress SARS-CoV-2/FLU/RSV plus assay is intended as an aid in the  diagnosis of influenza from Nasopharyngeal swab specimens and should not be used as a sole basis for treatment. Nasal washings and aspirates are unacceptable for Xpert Xpress SARS-CoV-2/FLU/RSV testing.  Fact Sheet for Patients: BloggerCourse.com  Fact Sheet for Healthcare Providers: SeriousBroker.it  This test is not yet approved or cleared by the [place] FDA and has been authorized for detection and/or diagnosis of SARS-CoV-2 by FDA under an Emergency Use Authorization (EUA). This EUA will remain in effect (meaning this test can be used) for the duration of the COVID-19 declaration under Section 564(b)(1) of the Act, 21 U.S.C. section 360bbb-3(b)(1), unless the authorization is terminated or revoked.  Performed at [facility], [address], [place], [place] [address]   Blood Culture (routine x 2)     Status: None   Collection Time: 06/25/21  3:37 AM   Specimen: BLOOD RIGHT ARM  Result Value Ref Range Status   Specimen Description BLOOD RIGHT ARM  Final   Special Requests   Final    AEROBIC BOTTLE ONLY Blood Culture results may not be optimal due to an inadequate volume of blood received in culture bottles   Culture   Final  NO GROWTH 5 DAYS Performed at [facility], [place] [address], [place], [place] [address]    Report Status 06/30/2021 FINAL  Final  Blood Culture (routine x 2)     Status: None   Collection Time: 06/25/21  3:42 AM   Specimen: BLOOD LEFT ARM  Result Value Ref Range Status   Specimen Description BLOOD LEFT ARM  Final   Special Requests   Final    BOTTLES DRAWN AEROBIC AND ANAEROBIC Blood Culture adequate volume   Culture   Final    NO GROWTH 5 DAYS Performed at [facility], [place] [address] [place], [place] [address]    Report Status 06/30/2021 FINAL  Final  Urine Culture     Status: Abnormal   Collection Time: 06/25/21  8:38 AM   Specimen: In/Out Cath Urine  Result Value  Ref Range Status   Specimen Description IN/OUT CATH URINE  Final   Special Requests   Final    NONE Performed at [facility], [place] [address], [place], [place] [address]    Culture (A)  Final    >=100,000 COLONIES/mL VANCOMYCIN RESISTANT ENTEROCOCCUS   Report Status 06/27/2021 FINAL  Final   Organism ID, Bacteria VANCOMYCIN RESISTANT ENTEROCOCCUS (A)  Final      Susceptibility   Vancomycin resistant enterococcus - MIC*    AMPICILLIN >=32 RESISTANT Resistant     NITROFURANTOIN 128 RESISTANT Resistant     VANCOMYCIN >=32 RESISTANT Resistant     LINEZOLID 2 SENSITIVE Sensitive     * >=100,000 COLONIES/mL VANCOMYCIN RESISTANT ENTEROCOCCUS  MRSA Next Gen by PCR, Nasal     Status: None   Collection Time: 06/27/21  8:45 PM   Specimen: Nasal Mucosa; Nasal Swab  Result Value Ref Range Status   MRSA by PCR Next Gen NOT DETECTED NOT DETECTED Final    Comment: (NOTE) The GeneXpert MRSA Assay (FDA approved for NASAL specimens only), is one component of a comprehensive MRSA colonization surveillance program. It is not intended to diagnose MRSA infection nor to guide or monitor treatment for MRSA infections. Test performance is not FDA approved in patients less than 78 years old. Performed at [facility], [place] [address], [place], [place] [address]   Resp Panel by RT-PCR (Flu A&B, Covid) Nasopharyngeal Swab     Status: None   Collection Time: 07/02/21  3:13 PM   Specimen: Nasopharyngeal Swab; Nasopharyngeal(NP) swabs in vial transport medium  Result Value Ref Range Status   SARS Coronavirus 2 by RT PCR NEGATIVE NEGATIVE Final    Comment: (NOTE) SARS-CoV-2 target nucleic acids are NOT DETECTED.  The SARS-CoV-2 RNA is generally detectable in upper respiratory specimens during the acute phase of infection. The lowest concentration of SARS-CoV-2 viral copies this assay can detect is 138 copies/mL. A negative result does not preclude SARS-Cov-2 infection and should not be used as  the sole basis for treatment or other patient management decisions. A negative result may occur with  improper specimen collection/handling, submission of specimen other than nasopharyngeal swab, presence of viral mutation(s) within the areas targeted by this assay, and inadequate number of viral copies(<138 copies/mL). A negative result must be combined with clinical observations, patient history, and epidemiological information. The expected result is Negative.  Fact Sheet for Patients:  EntrepreneurPulse.com.au  Fact Sheet for Healthcare Providers:  IncredibleEmployment.be  This test is no t yet approved or cleared by the [place] FDA and  has been authorized for detection and/or diagnosis  of SARS-CoV-2 by FDA under an Emergency Use Authorization (EUA). This EUA will remain  in effect (meaning this test can be used) for the duration of the COVID-19 declaration under Section 564(b)(1) of the Act, 21 U.S.C.section 360bbb-3(b)(1), unless the authorization is terminated  or revoked sooner.       Influenza A by PCR NEGATIVE NEGATIVE Final   Influenza B by PCR NEGATIVE NEGATIVE Final    Comment: (NOTE) The Xpert Xpress SARS-CoV-2/FLU/RSV plus assay is intended as an aid in the diagnosis of influenza from Nasopharyngeal swab specimens and should not be used as a sole basis for treatment. Nasal washings and aspirates are unacceptable for Xpert Xpress SARS-CoV-2/FLU/RSV testing.  Fact Sheet for Patients: EntrepreneurPulse.com.au  Fact Sheet for Healthcare Providers: IncredibleEmployment.be  This test is not yet approved or cleared by the [place] FDA and has been authorized for detection and/or diagnosis of SARS-CoV-2 by FDA under an Emergency Use Authorization (EUA). This EUA will remain in effect (meaning this test can be used) for the duration of the COVID-19 declaration under Section 564(b)(1) of  the Act, 21 U.S.C. section 360bbb-3(b)(1), unless the authorization is terminated or revoked.  Performed at [facility], [place] [address], [place], [place] [address]      Labs: Basic Metabolic Panel: Recent Labs  Lab 06/27/21 0540 06/29/21 0620 06/30/21 0643 07/01/21 0625 07/02/21 0602 07/03/21 0801  NA 139 141 138 137 138 135  K 3.2* 3.3* 5.2* 4.5 4.4 4.4  CL 110 107 106 103 101 103  CO2 20* 22 23 25 26  20*  GLUCOSE 97 106* 117* 122* 118* 121*  BUN 26* 13 12 14 15 21   CREATININE 0.82 0.85 0.72 0.66 0.65 0.72  CALCIUM 8.3* 8.6* 8.8* 8.9 9.1 9.0  MG 1.7  --   --   --   --   --    Liver Function Tests: Recent Labs  Lab 06/27/21 0540 07/01/21 0625 07/03/21 0801  AST 28 17 16   ALT 22 15 14   ALKPHOS 61 57 62  BILITOT 1.1 0.4 0.2*  PROT 5.3* 6.0* 6.5  ALBUMIN 2.2* 2.0* 2.2*   CBC: Recent Labs  Lab 06/27/21 0540 06/29/21 0620 07/01/21 0625 07/03/21 0801  WBC 17.8* 14.3* 12.3* 11.5*  HGB 8.7* 9.8* 9.4* 9.5*  HCT 27.0* 31.3* 30.2* 29.9*  MCV 85.7 85.3 86.0 85.4  PLT 125* 121* 185 225   CBG: Recent Labs  Lab 07/02/21 2217 07/03/21 0023 07/03/21 0434 07/03/21 0744 07/03/21 1142  GLUCAP 113* 118* 119* 113* 121*   Hgb A1c No results for input(s): HGBA1C in the last 72 hours. Lipid Profile No results for input(s): CHOL, HDL, LDLCALC, TRIG, CHOLHDL, LDLDIRECT in the last 72 hours. Thyroid function studies No results for input(s): TSH, T4TOTAL, T3FREE, THYROIDAB in the last 72 hours.  Invalid input(s): FREET3 Urinalysis    Component Value Date/Time   COLORURINE YELLOW 06/25/2021 0731   APPEARANCEUR HAZY (A) 06/25/2021 0731   LABSPEC 1.014 06/25/2021 0731   PHURINE 7.0 06/25/2021 0731   GLUCOSEU NEGATIVE 06/25/2021 0731   HGBUR NEGATIVE 06/25/2021 0731   BILIRUBINUR NEGATIVE 06/25/2021 0731   KETONESUR NEGATIVE 06/25/2021 0731   PROTEINUR 100 (A) 06/25/2021 0731   NITRITE NEGATIVE 06/25/2021 0731   LEUKOCYTESUR NEGATIVE 06/25/2021 0731     FURTHER DISCHARGE INSTRUCTIONS:   Get Medicines reviewed and adjusted: Please take all your medications with you for your next visit with your Primary MD   Laboratory/radiological data: Please request your Primary MD to go  over all hospital tests and procedure/radiological results at the follow up, please ask your Primary MD to get all Hospital records sent to his/her office.   In some cases, they will be blood work, cultures and biopsy results pending at the time of your discharge. Please request that your primary care M.D. goes through all the records of your hospital data and follows up on these results.   Also Note the following: If you experience worsening of your admission symptoms, develop shortness of breath, life threatening emergency, suicidal or homicidal thoughts you must seek medical attention immediately by calling 911 or calling your MD immediately  if symptoms less severe.   You must read complete instructions/literature along with all the possible adverse reactions/side effects for all the Medicines you take and that have been prescribed to you. Take any new Medicines after you have completely understood and accpet all the possible adverse reactions/side effects.    Do not drive when taking Pain medications or sleeping medications (Benzodaizepines)   Do not take more than prescribed Pain, Sleep and Anxiety Medications. It is not advisable to combine anxiety,sleep and pain medications without talking with your primary care practitioner   Special Instructions: If you have smoked or chewed Tobacco  in the last 2 yrs please stop smoking, stop any regular Alcohol  and or any Recreational drug use.   Wear Seat belts while driving.   Please note: You were cared for by a hospitalist during your hospital stay. Once you are discharged, your primary care physician will handle any further medical issues. Please note that NO REFILLS for any discharge medications will be authorized  once you are discharged, as it is imperative that you return to your primary care physician (or establish a relationship with a primary care physician if you do not have one) for your post hospital discharge needs so that they can reassess your need for medications and monitor your lab values.  Time coordinating discharge: 40 minutes  SIGNED:  Marzetta Board, MD, PhD 07/03/2021, 11:56 AM

## 2021-07-03 NOTE — TOC Progression Note (Addendum)
Transition of Care [facility]) - Progression Note    Patient Details  Name: Jill Buchanan MRN: [id number] Date of Birth: 1961/05/11  Transition of Care [facility]) CM/SW [place], [place] Phone Number: 07/03/2021, 10:44 AM  Clinical Narrative:     CSW spoke with the receptionist at the facility at 9:45am, 9:48am, 9:55am and 10:42 am.  CSW was transferred to the receiving floor nursing station.  No one is answering the phone.  CSW will continue to reach out to the facility for disposition.  Expected Discharge Plan: Long Term Nursing Home Barriers to Discharge: Continued Medical Work up  Expected Discharge Plan and Services Expected Discharge Plan: [place] Acute Care Choice:  (TBD-pending PT recs) Living arrangements for the past 2 months: [place] ([place])                                       Social Determinants of Health (SDOH) Interventions    Readmission Risk Interventions No flowsheet data found.

## 2021-07-03 NOTE — Plan of Care (Signed)

## 2021-07-07 ENCOUNTER — Emergency Department (HOSPITAL_COMMUNITY): Payer: Medicaid Other

## 2021-07-07 ENCOUNTER — Other Ambulatory Visit: Payer: Self-pay

## 2021-07-07 ENCOUNTER — Inpatient Hospital Stay (HOSPITAL_COMMUNITY)
Admission: EM | Admit: 2021-07-07 | Discharge: 2021-07-10 | DRG: 189 | Disposition: A | Discharge status: Hospice | Payer: Medicaid Other | Source: Skilled Nursing Facility | Attending: Internal Medicine | Admitting: Internal Medicine

## 2021-07-07 ENCOUNTER — Encounter (HOSPITAL_COMMUNITY): Payer: Self-pay

## 2021-07-07 DIAGNOSIS — Z1621 Resistance to vancomycin: Secondary | ICD-10-CM | POA: Diagnosis present

## 2021-07-07 DIAGNOSIS — H543 Unqualified visual loss, both eyes: Secondary | ICD-10-CM | POA: Diagnosis present

## 2021-07-07 DIAGNOSIS — B952 Enterococcus as the cause of diseases classified elsewhere: Secondary | ICD-10-CM | POA: Diagnosis present

## 2021-07-07 DIAGNOSIS — I482 Chronic atrial fibrillation, unspecified: Secondary | ICD-10-CM | POA: Diagnosis present

## 2021-07-07 DIAGNOSIS — D500 Iron deficiency anemia secondary to blood loss (chronic): Secondary | ICD-10-CM | POA: Diagnosis present

## 2021-07-07 DIAGNOSIS — R64 Cachexia: Secondary | ICD-10-CM | POA: Diagnosis present

## 2021-07-07 DIAGNOSIS — R0902 Hypoxemia: Secondary | ICD-10-CM

## 2021-07-07 DIAGNOSIS — Z20822 Contact with and (suspected) exposure to covid-19: Secondary | ICD-10-CM | POA: Diagnosis present

## 2021-07-07 DIAGNOSIS — Z931 Gastrostomy status: Secondary | ICD-10-CM | POA: Diagnosis not present

## 2021-07-07 DIAGNOSIS — K922 Gastrointestinal hemorrhage, unspecified: Secondary | ICD-10-CM | POA: Diagnosis present

## 2021-07-07 DIAGNOSIS — R5381 Other malaise: Secondary | ICD-10-CM | POA: Diagnosis present

## 2021-07-07 DIAGNOSIS — D649 Anemia, unspecified: Secondary | ICD-10-CM | POA: Diagnosis present

## 2021-07-07 DIAGNOSIS — J9601 Acute respiratory failure with hypoxia: Secondary | ICD-10-CM | POA: Diagnosis present

## 2021-07-07 DIAGNOSIS — Z515 Encounter for palliative care: Secondary | ICD-10-CM | POA: Diagnosis present

## 2021-07-07 DIAGNOSIS — I639 Cerebral infarction, unspecified: Secondary | ICD-10-CM

## 2021-07-07 DIAGNOSIS — Z951 Presence of aortocoronary bypass graft: Secondary | ICD-10-CM | POA: Diagnosis not present

## 2021-07-07 DIAGNOSIS — Z66 Do not resuscitate: Secondary | ICD-10-CM | POA: Diagnosis present

## 2021-07-07 DIAGNOSIS — Z79899 Other long term (current) drug therapy: Secondary | ICD-10-CM | POA: Diagnosis not present

## 2021-07-07 DIAGNOSIS — R1310 Dysphagia, unspecified: Secondary | ICD-10-CM | POA: Diagnosis present

## 2021-07-07 DIAGNOSIS — N390 Urinary tract infection, site not specified: Secondary | ICD-10-CM | POA: Diagnosis present

## 2021-07-07 DIAGNOSIS — E860 Dehydration: Secondary | ICD-10-CM | POA: Diagnosis present

## 2021-07-07 DIAGNOSIS — I2510 Atherosclerotic heart disease of native coronary artery without angina pectoris: Secondary | ICD-10-CM | POA: Diagnosis present

## 2021-07-07 DIAGNOSIS — Z7901 Long term (current) use of anticoagulants: Secondary | ICD-10-CM

## 2021-07-07 DIAGNOSIS — Z7982 Long term (current) use of aspirin: Secondary | ICD-10-CM

## 2021-07-07 DIAGNOSIS — Z91012 Allergy to eggs: Secondary | ICD-10-CM

## 2021-07-07 DIAGNOSIS — I69391 Dysphagia following cerebral infarction: Secondary | ICD-10-CM | POA: Diagnosis not present

## 2021-07-07 DIAGNOSIS — J690 Pneumonitis due to inhalation of food and vomit: Secondary | ICD-10-CM | POA: Diagnosis present

## 2021-07-07 DIAGNOSIS — I959 Hypotension, unspecified: Secondary | ICD-10-CM | POA: Diagnosis present

## 2021-07-07 DIAGNOSIS — Z8616 Personal history of COVID-19: Secondary | ICD-10-CM

## 2021-07-07 DIAGNOSIS — Z7189 Other specified counseling: Secondary | ICD-10-CM | POA: Diagnosis not present

## 2021-07-07 DIAGNOSIS — I6381 Other cerebral infarction due to occlusion or stenosis of small artery: Secondary | ICD-10-CM | POA: Diagnosis present

## 2021-07-07 LAB — I-STAT ARTERIAL BLOOD GAS, ED
Acid-base deficit: 1 mmol/L (ref 0.0–2.0)
Bicarbonate: 23.4 mmol/L (ref 20.0–28.0)
Calcium, Ion: 1.33 mmol/L (ref 1.15–1.40)
HCT: 30 % — ABNORMAL LOW (ref 36.0–46.0)
Hemoglobin: 10.2 g/dL — ABNORMAL LOW (ref 12.0–15.0)
O2 Saturation: 85 %
Patient temperature: 97
Potassium: 3.8 mmol/L (ref 3.5–5.1)
Sodium: 143 mmol/L (ref 135–145)
TCO2: 25 mmol/L (ref 22–32)
pCO2 arterial: 36.6 mmHg (ref 32.0–48.0)
pH, Arterial: 7.409 (ref 7.350–7.450)
pO2, Arterial: 47 mmHg — ABNORMAL LOW (ref 83.0–108.0)

## 2021-07-07 LAB — RESP PANEL BY RT-PCR (FLU A&B, COVID) ARPGX2
Influenza A by PCR: NEGATIVE
Influenza B by PCR: NEGATIVE
SARS Coronavirus 2 by RT PCR: NEGATIVE

## 2021-07-07 LAB — I-STAT CHEM 8, ED
BUN: 57 mg/dL — ABNORMAL HIGH (ref 8–23)
Calcium, Ion: 1.12 mmol/L — ABNORMAL LOW (ref 1.15–1.40)
Chloride: 111 mmol/L (ref 98–111)
Creatinine, Ser: 0.8 mg/dL (ref 0.44–1.00)
Glucose, Bld: 236 mg/dL — ABNORMAL HIGH (ref 70–99)
HCT: 27 % — ABNORMAL LOW (ref 36.0–46.0)
Hemoglobin: 9.2 g/dL — ABNORMAL LOW (ref 12.0–15.0)
Potassium: 4.8 mmol/L (ref 3.5–5.1)
Sodium: 142 mmol/L (ref 135–145)
TCO2: 25 mmol/L (ref 22–32)

## 2021-07-07 IMAGING — DX DG CHEST 1V PORT
1 series · 1 of 1 positions shown · non-contrast
Comparison: [DATE]

CLINICAL DATA: Dyspnea

EXAM:
PORTABLE CHEST 1 VIEW

[chest ap]
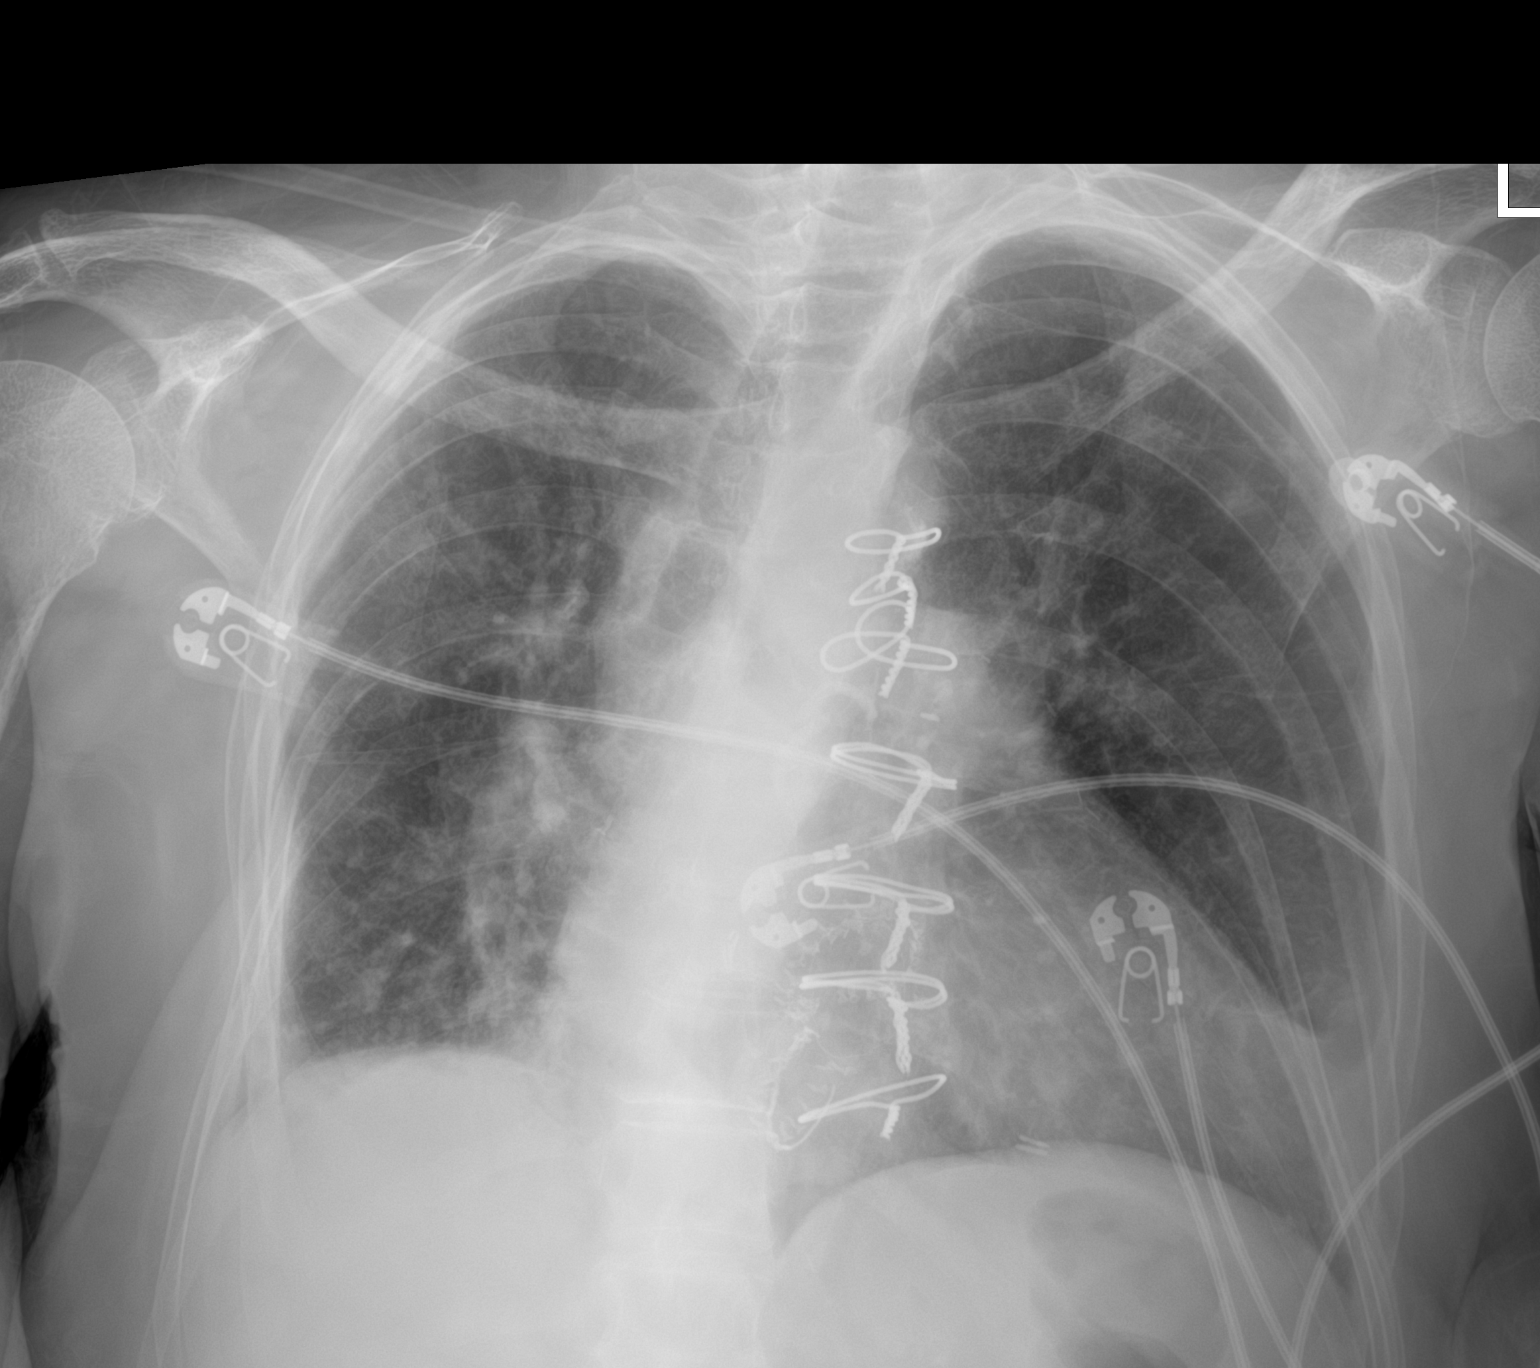

[1 of 1 positions shown; findings below may reference images not displayed]

FINDINGS: The patient is moderately rotated on this examination likely
resulting in artifactual right-sided volume loss. There is patchy
infiltrate noted within the right upper lobe, possibly infectious or
inflammatory in the acute setting. No pneumothorax or pleural
effusion. Aortic and mitral valve replacement has been performed.
Cardiac size is stable. Central pulmonary vascular congestion
persists without overt pulmonary edema.
IMPRESSION: Patchy infiltrate within the right upper lobe, possibly infectious
or inflammatory in the acute setting.

Stable cardiomegaly.  Central pulmonary venous vascular engorgement.

## 2021-07-07 MED ORDER — SODIUM CHLORIDE 0.9 % IV SOLN
2.0000 g | Freq: Two times a day (BID) | INTRAVENOUS | Status: DC
  Administered 2021-07-08: 2 g via INTRAVENOUS
  Filled 2021-07-07: qty 2

## 2021-07-07 MED ORDER — VANCOMYCIN HCL IN DEXTROSE 1-5 GM/200ML-% IV SOLN: 1000.0000 mg | INTRAVENOUS | Status: DC

## 2021-07-07 MED ORDER — VANCOMYCIN HCL 1250 MG/250ML IV SOLN
1250.0000 mg | Freq: Once | INTRAVENOUS | Status: AC
  Administered 2021-07-07: 1250 mg via INTRAVENOUS
  Filled 2021-07-07: qty 250

## 2021-07-07 MED ORDER — SODIUM CHLORIDE 0.9 % IV SOLN
2.0000 g | Freq: Once | INTRAVENOUS | Status: AC
  Administered 2021-07-07: 2 g via INTRAVENOUS
  Filled 2021-07-07: qty 2

## 2021-07-07 MED ORDER — METRONIDAZOLE 500 MG/100ML IV SOLN
500.0000 mg | Freq: Three times a day (TID) | INTRAVENOUS | Status: DC
  Administered 2021-07-08 (×3): 500 mg via INTRAVENOUS
  Filled 2021-07-07 (×3): qty 100

## 2021-07-07 NOTE — Progress Notes (Signed)
Pharmacy Antibiotic Note  Jill Buchanan is a 61 y.o. female admitted from a nursing home on 07/07/2021 with pneumonia.  Pharmacy has been consulted for vancomycin and cefepime dosing.  Of note, patient was recently admitted in January 2023 for aspiration pneumonia and VRE UTI.  Plan: Cefepime 2g q12h  Vancomycin 1250 mg x1 Vancomycin 1000 mg q24h (eAUC 486.7, SCr=0.8) Trend WBC, temp, renal function  F/U infectious work-up Drug levels as indicated   Height: 5\' 3"  (160 cm) Weight: 52.4 kg (115 lb 8.3 oz) IBW/kg (Calculated) : 52.4  Temp (24hrs), Avg:97.8 F (36.6 C), Min:97 F (36.1 C), Max:98.5 F (36.9 C)  Recent Labs  Lab 07/01/21 0625 07/02/21 0602 07/03/21 0801 07/07/21 2056  WBC 12.3*  --  11.5*  --   CREATININE 0.66 0.65 0.72 0.80    Estimated Creatinine Clearance: 60.3 mL/min (by C-G formula based on SCr of 0.8 mg/dL).    Allergies  Allergen Reactions   Eggs Or Egg-Derived Products     Antimicrobials this admission: Cefepime 2/8 >>  Vancomycin 2/8 >>  Metronidazole 2/8 >>  Microbiology results: 2/8 BCx: Pending 2/8 MRSA PCR: Pending  Thank you for allowing pharmacy to be a part of this patients care.  4/8, Pharm.D. PGY-1 Pharmacy Resident [phone] 07/07/2021 10:42 PM

## 2021-07-07 NOTE — ED Notes (Signed)
Patient taken off non-rebreather mask (per Dr. Graceann Congress) and SpO2 dropped to 88% and patient placed back on non-rebreather mask and SpO2 increased to 96%.

## 2021-07-07 NOTE — ED Notes (Signed)
RT at bedside for deep suction.

## 2021-07-07 NOTE — ED Provider Notes (Signed)
[facility] EMERGENCY DEPARTMENT Provider Note   CSN: [id number] Arrival date & time: 07/07/21  2027     History  Chief Complaint  Patient presents with   Aspiration   Shortness of Breath    Jill Buchanan is a 61 y.o. female.   Shortness of Breath Patient brought in from nursing home with hypoxia.  Recent admission with multiple medical problems.  Has had GI bleeds and strokes.  Is blind at baseline.  Apparently nonverbal and fed with PEG tube.  Although review of notes there was some thought that she may be getting oral foods and potentially too thin.  Sats with EMS arrival was 73%.  Placed on nonrebreather and oxygen of 83%.  Nonambulatory at baseline.  Reviewing records appears patient is a DNR.  Would do intubation but only if it would delay dying to get family here.  No CPR no ACLS medicines.  I attempted to get a hold of Sherrin Daisy, the patient's daughter but was unable to.  I did talk to [employer], the patient's sister.  Patient CODE STATUS was affirmed, however the patient's sister think she should be more comfort care but states that the daughter is "kin".  For now I will keep the status that was in place on discharge 4 days ago.   Past Medical History:  Diagnosis Date   A-fib (HCC)    Anemia    CAD (coronary artery disease)    s/p multivessel CABG   Chronic anticoagulation    Congenital blindness    COVID    04/2021   CVA (cerebral vascular accident) (HCC)    Dysphagia due to old stroke    GIB (gastrointestinal bleeding)    PEG (percutaneous endoscopic gastrostomy) status (HCC)     Home Medications Prior to Admission medications   Medication Sig Start Date End Date Taking? Authorizing Provider  acetaminophen (TYLENOL) 500 MG tablet Place 1 tablet (500 mg total) into feeding tube every 4 (four) hours as needed for mild pain. 05/02/21  Yes Derrel Nip, MD  amLODipine (NORVASC) 10 MG tablet Place 1 tablet (10 mg total) into feeding tube  daily. 05/02/21  Yes Derrel Nip, MD  apixaban (ELIQUIS) 5 MG TABS tablet Place 1 tablet (5 mg total) into feeding tube 2 (two) times daily. 05/02/21  Yes Derrel Nip, MD  aspirin 81 MG chewable tablet Place 1 tablet (81 mg total) into feeding tube daily. 05/03/21  Yes Derrel Nip, MD  atorvastatin (LIPITOR) 80 MG tablet Place 1 tablet (80 mg total) into feeding tube daily. 05/03/21  Yes Derrel Nip, MD  carvedilol (COREG) 25 MG tablet Place 1 tablet (25 mg total) into feeding tube 2 (two) times daily. 05/02/21  Yes Derrel Nip, MD  FERROUS SULFATE PO 7.5 mLs by Gastric Tube route 2 (two) times daily.   Yes [provider]  Multiple Vitamins-Minerals (MULTIVITAMIN WITH MINERALS) tablet Place 1 tablet into feeding tube daily. 05/02/21  Yes Derrel Nip, MD  Nutritional Supplements (FEEDING SUPPLEMENT, OSMOLITE 1.2 CAL,) LIQD Place 1,000 mLs into feeding tube continuous. 05/02/21  Yes Derrel Nip, MD  Nutritional Supplements (FEEDING SUPPLEMENT, PROSOURCE TF,) liquid Place 45 mLs into feeding tube daily. 05/03/21  Yes Derrel Nip, MD  pantoprazole (PROTONIX) 40 MG tablet Take 40 mg by mouth daily. 05/21/21  Yes [provider]  polyethylene glycol (MIRALAX / GLYCOLAX) 17 g packet Place 17 g into feeding tube daily as needed for moderate constipation or severe constipation. Patient taking differently: Place 61  g into feeding tube daily. 05/02/21  Yes Derrel Nip, MD  senna-docusate (SENOKOT-S) 8.6-50 MG tablet Place 1 tablet into feeding tube at bedtime as needed for mild constipation. Patient taking differently: Place 1 tablet into feeding tube daily as needed for mild constipation. 05/02/21  Yes Derrel Nip, MD  Zinc Oxide 40 % PSTE Apply 1 application topically in the morning and at bedtime. Apply to buttocks every day and night shift   Yes [provider]      Allergies    Eggs or egg-derived products    Review of Systems    Review of Systems  Unable to perform ROS: Patient nonverbal  Respiratory:  Positive for shortness of breath.    Physical Exam Updated Vital Signs BP 115/73    Pulse 81    Temp 98.5 F (36.9 C) (Rectal)    Resp (!) 26    Ht 5\' 3"  (1.6 m)    Wt 52.4 kg    SpO2 95%    BMI 20.46 kg/m  Physical Exam Vitals reviewed.  HENT:     Mouth/Throat:     Comments: Mouth very dry. Eyes:     Comments: Patient blind in both eyes.  Cardiovascular:     Rate and Rhythm: Tachycardia present.  Pulmonary:     Comments: Diffuse harsh breath sounds with diffuse rhonchi and upper airway sounds. Abdominal:     Comments: PEG tube in left upper abdomen.  Musculoskeletal:     Right lower leg: No tenderness.     Left lower leg: No tenderness.  Neurological:     Comments: Nonverbal blind not following commands.  Weak gag reflex.  Extremities contracted.    ED Results / Procedures / Treatments   Labs (all labs ordered are listed, but only abnormal results are displayed) Labs Reviewed  I-STAT ARTERIAL BLOOD GAS, ED - Abnormal; Notable for the following components:      Result Value   pO2, Arterial 47 (*)    HCT 30.0 (*)    Hemoglobin 10.2 (*)    All other components within normal limits  I-STAT CHEM 8, ED - Abnormal; Notable for the following components:   BUN 57 (*)    Glucose, Bld 236 (*)    Calcium, Ion 1.12 (*)    Hemoglobin 9.2 (*)    HCT 27.0 (*)    All other components within normal limits  CULTURE, BLOOD (ROUTINE X 2)  CULTURE, BLOOD (ROUTINE X 2)  RESP PANEL BY RT-PCR (FLU A&B, COVID) ARPGX2  MRSA NEXT GEN BY PCR, NASAL  COMPREHENSIVE METABOLIC PANEL  CBC WITH DIFFERENTIAL/PLATELET  URINALYSIS, ROUTINE W REFLEX MICROSCOPIC    EKG EKG Interpretation  Date/Time:  Wednesday July 07 2021 20:28:19 EST Ventricular Rate:  91 PR Interval:  165 QRS Duration: 96 QT Interval:  403 QTC Calculation: 494 R Axis:   50 Text Interpretation: Sinus rhythm Left ventricular hypertrophy  Abnormal T, consider ischemia, diffuse leads Artifact in lead(s) aVR aVF V1 V2 V3 V4 V5 V6 Confirmed by 10[birth year]25[birth year]1986 [phone]) on 07/07/2021 9:32:36 PM  Radiology DG Chest Portable 1 View  Result Date: 07/07/2021 CLINICAL DATA:  Dyspnea EXAM: PORTABLE CHEST 1 VIEW COMPARISON:  07/01/2021 FINDINGS: The patient is moderately rotated on this examination likely resulting in artifactual right-sided volume loss. There is patchy infiltrate noted within the right upper lobe, possibly infectious or inflammatory in the acute setting. No pneumothorax or pleural effusion. 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 Cardiac size is stable. Central  pulmonary vascular congestion persists without overt pulmonary edema. IMPRESSION: Patchy infiltrate within the right upper lobe, possibly infectious or inflammatory in the acute setting. Stable cardiomegaly.  Central pulmonary venous vascular engorgement. Electronically Signed   By: Helyn Numbers M.D.   On: 07/07/2021 20:48    Procedures Procedures    Medications Ordered in ED Medications  vancomycin (VANCOREADY) IVPB 1250 mg/250 mL (1,250 mg Intravenous New Bag/Given 07/07/21 2241)  metroNIDAZOLE (FLAGYL) IVPB 500 mg (has no administration in time range)  ceFEPIme (MAXIPIME) 2 g in sodium chloride 0.9 % 100 mL IVPB (has no administration in time range)  vancomycin (VANCOCIN) IVPB 1000 mg/200 mL premix (has no administration in time range)  ceFEPIme (MAXIPIME) 2 g in sodium chloride 0.9 % 100 mL IVPB (0 g Intravenous Stopped 07/07/21 2238)    ED Course/ Medical Decision Making/ A&P Clinical Course as of 07/07/21 2313  Wed Jul 07, 2021  2050 DG Chest Portable 1 View [MH]    Clinical Course User Index [MH] Jacqualin Combes                           Medical Decision Making Amount and/or Complexity of Data Reviewed Labs: ordered. Radiology: ordered.  Risk Prescription drug management.   Initial complaint of hypoxia.  Patient cannot  provide any history due to nonverbal status.  Initial differential gnosis for hypoxia is long and includes life-threatening conditions including pneumonia, pneumothorax.  Patient presents with hypoxia.  Sats down to the 70s.  Improved after suctioning but at this point still requiring oxygen.  Chest x-ray done and showed new pneumonia.  Potentially aspiration pneumonia with history of aspiration.  Antibiotics started after consultation with pharmacy.  Discussed with patient's sister.  Patient is a DNR and does not want CPR or ACLS medications.  Does not want noninvasive ventilation.  Per previous records would want intubation.  Patient's sister would rather make her pure comfort care but states that the patient's daughter is "kin".  I am not sure if anyone has healthcare power of attorney but there are previous notes saying that it may be the daughter.  I have not been able to reach her as her phone goes to voicemail and the mailbox is full.  Patient's sister states that they have been trying to contact her by Facebook but she has not been responding. Antibiotics been given.  Initial ABG showed hypoxia but that is likely improved now after the suctioning.  Doing better and will attempt to titrate off oxygen, with the new infiltrate I think patient will benefit from admission to the hospital since she is still not on hospice or comfort care but I think another palliative medicine consult would help. Care turned over to Dr. Bebe Shaggy        Final Clinical Impression(s) / ED Diagnoses Final diagnoses:  Hypoxia  Aspiration pneumonia, unspecified aspiration pneumonia type, unspecified laterality, unspecified part of lung [facility])  Encounter for palliative care    Rx / DC Orders ED Discharge Orders     None         Benjiman Core, MD 07/07/21 2313

## 2021-07-07 NOTE — ED Notes (Signed)
Supervisor from [facility] called for update on pt's condition. Contact information [phone].

## 2021-07-07 NOTE — ED Notes (Signed)
Soiled brief changed, pt repositioned. New underpads placed.

## 2021-07-07 NOTE — ED Triage Notes (Signed)
Pt presents to ED via EMS from [facility] for [place], hypoxia, and possible aspiration. Staff reports she was found around 1900 in this condition and placed pt on 3L [place]. On arrival to scene, EMS reports pt's O2 was 73%. Pt then placed on NRB and O2 was 83%. Pt is blind and non-verbal at baseline. Can follow simple commands. Was recently at hospital for same complaint. Pt is tube fed. Audible rhonchi and rales. 20G R FA.

## 2021-07-08 ENCOUNTER — Other Ambulatory Visit: Payer: Self-pay

## 2021-07-08 DIAGNOSIS — Z66 Do not resuscitate: Secondary | ICD-10-CM

## 2021-07-08 DIAGNOSIS — Z515 Encounter for palliative care: Secondary | ICD-10-CM

## 2021-07-08 DIAGNOSIS — I69391 Dysphagia following cerebral infarction: Secondary | ICD-10-CM

## 2021-07-08 DIAGNOSIS — J690 Pneumonitis due to inhalation of food and vomit: Secondary | ICD-10-CM

## 2021-07-08 DIAGNOSIS — Z7189 Other specified counseling: Secondary | ICD-10-CM

## 2021-07-08 LAB — URINALYSIS, ROUTINE W REFLEX MICROSCOPIC
Bilirubin Urine: NEGATIVE
Glucose, UA: NEGATIVE mg/dL
Hgb urine dipstick: NEGATIVE
Ketones, ur: NEGATIVE mg/dL
Nitrite: NEGATIVE
Protein, ur: 100 mg/dL — AB
Specific Gravity, Urine: 1.021 (ref 1.005–1.030)
pH: 6 (ref 5.0–8.0)

## 2021-07-08 LAB — CBC
HCT: 22.6 % — ABNORMAL LOW (ref 36.0–46.0)
Hemoglobin: 6.8 g/dL — CL (ref 12.0–15.0)
MCH: 27 pg (ref 26.0–34.0)
MCHC: 30.1 g/dL (ref 30.0–36.0)
MCV: 89.7 fL (ref 80.0–100.0)
Platelets: 318 10*3/uL (ref 150–400)
RBC: 2.52 MIL/uL — ABNORMAL LOW (ref 3.87–5.11)
RDW: 16.4 % — ABNORMAL HIGH (ref 11.5–15.5)
WBC: 17.4 10*3/uL — ABNORMAL HIGH (ref 4.0–10.5)
nRBC: 0 % (ref 0.0–0.2)

## 2021-07-08 LAB — BASIC METABOLIC PANEL
Anion gap: 10 (ref 5–15)
BUN: 42 mg/dL — ABNORMAL HIGH (ref 8–23)
CO2: 20 mmol/L — ABNORMAL LOW (ref 22–32)
Calcium: 9.1 mg/dL (ref 8.9–10.3)
Chloride: 114 mmol/L — ABNORMAL HIGH (ref 98–111)
Creatinine, Ser: 0.77 mg/dL (ref 0.44–1.00)
GFR, Estimated: >60 mL/min (ref >60)
Glucose, Bld: 109 mg/dL — ABNORMAL HIGH (ref 70–99)
Potassium: 3.8 mmol/L (ref 3.5–5.1)
Sodium: 144 mmol/L (ref 135–145)

## 2021-07-08 LAB — URINALYSIS, MICROSCOPIC (REFLEX): WBC, UA: >50 WBC/hpf (ref 0–5)

## 2021-07-08 LAB — HEMOGLOBIN AND HEMATOCRIT, BLOOD
HCT: 28.5 % — ABNORMAL LOW (ref 36.0–46.0)
Hemoglobin: 9.1 g/dL — ABNORMAL LOW (ref 12.0–15.0)

## 2021-07-08 LAB — MRSA NEXT GEN BY PCR, NASAL: MRSA by PCR Next Gen: NOT DETECTED

## 2021-07-08 LAB — PREPARE RBC (CROSSMATCH)

## 2021-07-08 MED ORDER — MORPHINE SULFATE (PF) 2 MG/ML IV SOLN
1.0000 mg | INTRAVENOUS | Status: DC
  Administered 2021-07-08 – 2021-07-09 (×4): 2 mg via INTRAVENOUS
  Administered 2021-07-09: 1 mg via INTRAVENOUS
  Administered 2021-07-10 (×2): 2 mg via INTRAVENOUS
  Filled 2021-07-08 (×7): qty 1

## 2021-07-08 MED ORDER — LORAZEPAM 2 MG/ML PO CONC: 1.0000 mg | ORAL | Status: DC | PRN

## 2021-07-08 MED ORDER — ACETAMINOPHEN 500 MG PO TABS: 500.0000 mg | ORAL_TABLET | ORAL | Status: DC | PRN

## 2021-07-08 MED ORDER — SODIUM CHLORIDE 0.9 % IV BOLUS
1000.0000 mL | Freq: Once | INTRAVENOUS | Status: AC
  Administered 2021-07-08: 1000 mL via INTRAVENOUS

## 2021-07-08 MED ORDER — GLYCOPYRROLATE 0.2 MG/ML IJ SOLN: 0.2000 mg | INTRAMUSCULAR | Status: DC | PRN

## 2021-07-08 MED ORDER — GLYCOPYRROLATE 1 MG PO TABS
1.0000 mg | ORAL_TABLET | ORAL | Status: DC | PRN
  Filled 2021-07-08: qty 1

## 2021-07-08 MED ORDER — BIOTENE DRY MOUTH MT LIQD: 15.0000 mL | OROMUCOSAL | Status: DC | PRN

## 2021-07-08 MED ORDER — PANTOPRAZOLE SODIUM 40 MG IV SOLR
40.0000 mg | Freq: Two times a day (BID) | INTRAVENOUS | Status: DC
  Administered 2021-07-08: 40 mg via INTRAVENOUS
  Filled 2021-07-08: qty 10

## 2021-07-08 MED ORDER — ONDANSETRON HCL 4 MG/2ML IJ SOLN: 4.0000 mg | Freq: Four times a day (QID) | INTRAMUSCULAR | Status: DC | PRN

## 2021-07-08 MED ORDER — APIXABAN 5 MG PO TABS: 5.0000 mg | ORAL_TABLET | Freq: Two times a day (BID) | ORAL | Status: DC

## 2021-07-08 MED ORDER — MORPHINE SULFATE (PF) 2 MG/ML IV SOLN: 1.0000 mg | INTRAVENOUS | Status: DC | PRN

## 2021-07-08 MED ORDER — CARVEDILOL 12.5 MG PO TABS
25.0000 mg | ORAL_TABLET | Freq: Two times a day (BID) | ORAL | Status: DC
  Administered 2021-07-08: 25 mg
  Filled 2021-07-08: qty 2

## 2021-07-08 MED ORDER — ASPIRIN 81 MG PO CHEW: 81.0000 mg | CHEWABLE_TABLET | Freq: Every day | ORAL | Status: DC

## 2021-07-08 MED ORDER — ONDANSETRON 4 MG PO TBDP: 4.0000 mg | ORAL_TABLET | Freq: Four times a day (QID) | ORAL | Status: DC | PRN

## 2021-07-08 MED ORDER — HALOPERIDOL LACTATE 2 MG/ML PO CONC
0.5000 mg | ORAL | Status: DC | PRN
  Filled 2021-07-08: qty 0.3

## 2021-07-08 MED ORDER — ACETAMINOPHEN 650 MG RE SUPP
650.0000 mg | Freq: Once | RECTAL | Status: AC
  Administered 2021-07-08: 650 mg via RECTAL
  Filled 2021-07-08: qty 1

## 2021-07-08 MED ORDER — SODIUM CHLORIDE 0.9 % IV SOLN: INTRAVENOUS | Status: AC

## 2021-07-08 MED ORDER — LORAZEPAM 1 MG PO TABS: 1.0000 mg | ORAL_TABLET | ORAL | Status: DC | PRN

## 2021-07-08 MED ORDER — POLYVINYL ALCOHOL 1.4 % OP SOLN
1.0000 [drp] | Freq: Four times a day (QID) | OPHTHALMIC | Status: DC | PRN
  Filled 2021-07-08: qty 15

## 2021-07-08 MED ORDER — SODIUM CHLORIDE 0.9 % IV SOLN
10.0000 mL/h | Freq: Once | INTRAVENOUS | Status: AC
  Administered 2021-07-08: 10 mL/h via INTRAVENOUS

## 2021-07-08 MED ORDER — LORAZEPAM 2 MG/ML IJ SOLN: 1.0000 mg | INTRAMUSCULAR | Status: DC | PRN

## 2021-07-08 MED ORDER — HALOPERIDOL LACTATE 5 MG/ML IJ SOLN: 0.5000 mg | INTRAMUSCULAR | Status: DC | PRN

## 2021-07-08 MED ORDER — AMLODIPINE BESYLATE 5 MG PO TABS: 10.0000 mg | ORAL_TABLET | Freq: Every day | ORAL | Status: DC

## 2021-07-08 MED ORDER — HALOPERIDOL 0.5 MG PO TABS
0.5000 mg | ORAL_TABLET | ORAL | Status: DC | PRN
  Filled 2021-07-08: qty 1

## 2021-07-08 MED ORDER — ATORVASTATIN CALCIUM 40 MG PO TABS: 80.0000 mg | ORAL_TABLET | Freq: Every day | ORAL | Status: DC

## 2021-07-08 NOTE — ED Notes (Signed)
Resp therapist made aware of pt current status and the current order. Resp therp at bedside

## 2021-07-08 NOTE — Assessment & Plan Note (Signed)
-  see above 

## 2021-07-08 NOTE — Assessment & Plan Note (Addendum)
-  comfort care focused

## 2021-07-08 NOTE — ED Notes (Signed)
Attempted to call report nurse not ready at this time

## 2021-07-08 NOTE — ED Notes (Signed)
Sister called for telephone consent for blood with Chapman Fitch, RN

## 2021-07-08 NOTE — Hospital Course (Addendum)
Jill Buchanan is a 61 y.o. female with medical history significant bilateral blindness,  CVA with chronic debility including dysphagia on PEG, chronic atrial fibrillation on Eliquis,CAD s/p CABG who presents from SNF with increasing shortness of breath and hypoxia.  Reportedly patient was hypoxic down to 73% per EMS report.  She was placed on nonrebreather requiring suction.  Patient is obtunded, nonverbal and not responsive to commands at this time. Transitioned to comfort care

## 2021-07-08 NOTE — ED Notes (Signed)
Pt continues to have gurgling respirations. Pt orally suctioned at this time. Noted thick secretions

## 2021-07-08 NOTE — Assessment & Plan Note (Addendum)
Acute hypoxic respiratory failure secondary to aspiration pneumonia -transition to comfort care- suspect an in hospital death

## 2021-07-08 NOTE — Consult Note (Signed)
Consultation Note Date: 07/08/2021   Patient Name: Jill Buchanan  DOB: 1960/10/01  MRN: [id number]  Age / Sex: 61 y.o., female  PCP: Pcp, No Referring Physician: Geradine Girt, DO  Reason for Consultation: Establishing goals of care "hospice"  HPI/Patient Profile: 61 y.o. female  with past medical history of CVA with chronic debility including dysphagia with PEG, chronic atrial fibrillation on Eliquis, CAD s/p CABG, and blindness. She presented to the emergency department from SNF on 07/07/2021 with shortness of breath and hypoxia. Per EMS report, patient was hypoxic to 73% and placed on NRB.  In the ED, chest x-ray showed new right upper lobe infiltrate. Negative flu and COVID. UA unremarkable.  Admitted to Hospitalist team with acute hypoxic respiratory failure secondary to aspiration pneumonia.   Clinical Assessment and Goals of Care: I have reviewed medical records including EPIC notes, labs and imaging, and went to see patient in the ED. She is unresponsive to voice and light touch. Tachypnea and facial grimacing noted. Respirations appear labored. Excessive respiratory secretions noted.    I spoke with her daughter/Jill Buchanan by phone to discuss diagnosis, prognosis, GOC, EOL wishes, disposition, and options. Patient is known to PMT from previous hospitalizations. I re-introduced Palliative Medicine as specialized medical care for people living with serious illness. It focuses on providing relief from the symptoms and stress of a serious illness.   We discussed a brief life review of the patient. [name] is originally from [place]. She re-located to [place] approximately 20 years ago to be closer to her sister (who lives in the [place] area). She is widowed. She has 6 children. [name] is the [employer] for the family.   We reviewed Trannie's medical course over the past 6 months. She  suffered a debilitating stroke in September 2022. She was hospitalized 04/27/21 with sepsis and acute hypoxic respiratory failure secondary to COVID. She also had anemia likely to GI bleed requiring 2 units PRBC transfusion. She was discharged to [facility] on 05/02/21.  She was then hospitalized 1/27 - 2/4 with aspiration pneumonia, UTI due to VRE, and new lacunar stroke.  We discussed her current illness and what it means in the larger context of her ongoing co-morbidities. Discussed that patient appears to be approaching EOL and provided education o the natural trajectory at EOL. Reviewed that Jill Buchanan's stroke has caused irreversible dysphagia. Provided education on  recurrent aspiration pneumonia and explained that it is a terminal condition. Reviewed that feeding tubes do not eliminate the risk of aspiration.   The difference between full scope medical intervention and comfort care was considered.  I introduced the concept of a comfort path, emphasizing that this path involves de-escalating and stopping full scope medical interventions, allowing a natural course to occur. Discussed that the goal is comfort and dignity rather than cure/prolonging life. Encouraged [name] to consider at what point family would want to stop full scope medical interventions, keeping in mind the concept of quality of life. Introduced hospice philosophy and provided information residential hospice  services.  [name] is agreeable to philosophy of hospice and initially requests that her mother be transferred to a hospice facility within 50 miles of [place]. She is stationed there and can travel up [place] from [place] having to request leave.   Additional time was spent in communication with RN case manager in the ED and searching for hospice facilities meeting this criteria.   I later called [name] back to let her know we had located a facility ([facility]) in [place], [place] that appeared to be within 50  miles of [place]. I also let [name] know that family would be responsible for cost of transport from [facility] to the facility, as it is considered an elective transfer.   Since our initial conversation, [name] has spoken with her aunt (patient's sister). They discussed that Jill Buchanan has already endured so much over the past few months. They have decided they don't want to put her through the stress of transport or risk her passing during transport.   Discussed transitioning to comfort care while in the hospital, and what that would look like--keeping her clean and dry, no labs, no artificial hydration or feeding, no antibiotics, minimizing of medications, comfort feeds, and medication for pain and dyspnea.   [name] is agreeable to transition to full comfort care with change of code status [to DNR/DNI. We discussed how to intubate Jill Buchanan for the sake of giving family time to arrive and say goodbye is not consistent with comfort care. Jill Buchanan verbalizes understanding and emphasizes that she just wants her mother to be kept comfortable at this point.   Discussed the option to transfer patient to a local hospice facility tomorrow if she is stable enough. [name] will consider this.   Questions and concerns were addressed.  The family was encouraged to call with questions or concerns.   Primary decision maker: Daughter/Jill Buchanan    SUMMARY OF RECOMMENDATIONS   Full comfort measures initiated Code statu changed from partial to DNR/DNI  Discontinue labs, IV fluids, antibiotics, cardiac monitoring Added orders for symptom management at EOL (see below) PMT will continue to follow holistically  Symptom Management:  Scheduled morphine 1-2 mg IV every 4 hours PRN morphine 1-2 mg IV every 2 hours for uncontrolled pain or dyspnea Lorazepam (ATIVAN) prn for anxiety Haloperidol (HALDOL) prn for agitation  Glycopyrrolate (ROBINUL) for excessive secretions Ondansetron (ZOFRAN) prn for nausea Polyvinyl alcohol (LIQUIFILM  TEARS) prn for dry eyes Antiseptic oral rinse (BIOTENE) prn for dry mouth   Code Status/Advance Care Planning: DNR  Prognosis:  Hours - Days  Discharge Planning: To Be Determined      Primary Diagnoses: Present on Admission:  Acute respiratory failure with hypoxia (HCC)  Atrial fibrillation, chronic (HCC)  Recurrent cerebrovascular accidents (CVAs) ([place])  Anemia   I have reviewed the medical record, interviewed the patient and family, and examined the patient. The following aspects are pertinent.  Past Medical History:  Diagnosis Date   A-fib (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 No pertinent family history. Scheduled Meds:  amLODipine  10 mg Per Tube Daily   aspirin  81 mg Per Tube Daily   atorvastatin  80 mg Per Tube Daily   carvedilol  25 mg Per Tube BID WC   pantoprazole (PROTONIX) IV  40 mg Intravenous Q12H   Continuous Infusions:  sodium chloride 75 mL/hr at 07/08/21 0014   ceFEPime (MAXIPIME) IV Stopped (07/08/21 1006)   metronidazole Stopped (07/08/21 0920)   vancomycin     PRN Meds:.acetaminophen   Allergies  Allergen Reactions   Eggs Or Egg-Derived Products    Review of Systems  Unable to perform ROS: Patient unresponsive   Physical Exam Vitals reviewed.  Constitutional:      General: She is not in acute distress.    Appearance: She is ill-appearing.  Cardiovascular:     Rate and Rhythm: Normal rate. Rhythm irregularly irregular.  Pulmonary:     Effort: Tachypnea and accessory muscle usage present.  Neurological:     Mental Status: She is unresponsive.    Vital Signs: BP 139/86    Pulse 88    Temp 99 F (37.2 C)    Resp (!) 23    Ht 5\' 3"   (1.6 m)    Wt 52.4 kg    SpO2 100%    BMI 20.46 kg/m  Pain Scale: Faces       SpO2: SpO2: 100 % O2 Device:SpO2: 100 % O2 Flow Rate: .O2 Flow Rate (L/min): 15 L/min   Palliative Assessment/Data: PPS 10%     Time Total: 94 minutes Greater than 50%  of this time was spent counseling and coordinating care related to the above assessment and plan.  Signed by: Lavena Bullion, NP   Please contact Palliative Medicine Team phone at [phone] for questions and concerns.  For individual provider: See Shea Evans

## 2021-07-08 NOTE — ED Notes (Signed)
Feeding tube is clogged. Difficult to clean. Irrigated with 48mL push pull. Patent at this time.

## 2021-07-08 NOTE — Progress Notes (Signed)
Progress Note   Patient: Jill Buchanan [id number]:[id number] DOB: 1960-10-24 DOA: 07/07/2021     1 DOS: the patient was seen and examined on 07/08/2021   Brief hospital course: Jill Buchanan is a 61 y.o. female with medical history significant bilateral blindness,  CVA with chronic debility including dysphagia on PEG, chronic atrial fibrillation on Eliquis,CAD s/p CABG who presents from SNF with increasing shortness of breath and hypoxia.  Reportedly patient was hypoxic down to 73% per EMS report.  She was placed on nonrebreather requiring suction.  Patient is obtunded, nonverbal and not responsive to commands at this time.  Very poor prognosis-- needs to be comfort care.  Assessment and Plan: * Acute respiratory failure with hypoxia (HCC)- (present on admission) Acute hypoxic respiratory failure secondary to aspiration pneumonia -CXR shows new RUL infiltrate. Given how ill appearing she is, will continue broad spectrum antibiotic with IV vancomycin, cefepime and Flagyl -Currently requiring 15 L nonrebreather.  Per Dr. Cyndia Bent:  Discussed CODE STATUS with daughter, patient is a DNR and would only want intubation in order to let family visit to say "see you later" -Per Dr. Cyndia Bent: Daughter also would like to speak with hospice and would like to be contacted by phone since she cannot be here in person.  Consult placed for palliative care. 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  However cannot necessarily rule out recurrent CVA but she is unstable from respiratory standpoint  for CT scan at this time -Dr. Cyndia Bent consulted nutrition for PEG tube feed.  Currently undergoing outpatient speech evaluation and is not on oral diet-- will hold tube feeds as she is aspirating -continuous IV fluids overnight -needs to transition to comfort care  Atrial fibrillation, chronic (HCC)- (present on admission) -holding eliquis due to anemia and acute GI bleeding  Anemia- (present on admission) -suspect GI bleeding -last time in the hospital was heme + -IV protonix -not stable to undergo any work up -encourage family to transition to comfort care -has gotten 1 unit PRBCs today   Recurrent cerebrovascular accidents (CVAs) (HCC)- (present on admission) -see above       Subjective: not responsive on NRB  Physical Exam: Vitals:   07/08/21 0855 07/08/21 0900 07/08/21 0905 07/08/21 0907  BP: (!) 141/68 130/77    Pulse: 84 77 88   Resp: 18 (!) 23 (!) 26   Temp:   98.1 F (36.7 C)   TempSrc:    Bladder  SpO2: 100% 100% 100%   Weight:      Height:        General: Appearance:    Ill appearing female with NRB- poor dentition     Lungs:     respirations labored, coarse breath sounds  Heart:    Normal heart rate.      Neurologic:   Minimally responsive to voice-- does withdraw to pain.      Data Reviewed: WBC elevated, Hgb 6.8  Family Communication: called daughter, no answer  Disposition: Status is: Inpatient Remains inpatient appropriate because: needs comfort care/palliative care talks  Planned Discharge Destination:  from sNF but needs residential hospice once family decides     Time spent: 35 minutes  Author: Joseph Art, DO 07/08/2021 9:10 AM  For on call review [email or web address].

## 2021-07-08 NOTE — ED Notes (Signed)
Received verbal report from Kasey C RN at this time °

## 2021-07-08 NOTE — H&P (Signed)
History and Physical    Patient: Jill Buchanan [id number] DOB: 01-18-1961 DOA: 07/07/2021 DOS: the patient was seen and examined on 07/08/2021 PCP: Pcp, No  Patient coming from: SNF  Chief Complaint:  Chief Complaint  Patient presents with   Aspiration   Shortness of Breath    HPI: Jill Buchanan is a 61 y.o. female with medical history significant bilateral blindness,  CVA with chronic debility including dysphagia on PEG, chronic atrial fibrillation on Eliquis,CAD s/p CABG who presents from SNF with increasing shortness of breath and hypoxia.  Reportedly patient was hypoxic down to 73% per EMS report.  She was placed on nonrebreather requiring suction.  Patient is obtunded, nonverbal and not responsive to commands at this time. In speaking with the daughter, who is her healthcare power of attorney, patient at baseline is able to say a few words and can transfer with some assistance.  Patient was recently hospitalized from 1/27 to 2/4 with aspiration pneumonia, UTI due to VRE, and new acute lacunar stroke.  She was also dehydrated with several electrolyte abnormalities. Prior to that she was admitted from 11/29 to 12/4 with sepsis and acute hypoxic respiratory failure secondary to COVID.  Also had anemia likely secondary to GI bleed requiring 2 units PRBC transfusion.  No EGD was done at the recommendation of GI.  In the ED, she was afebrile with stable vitals on 15 L nonrebreather although is tachypneic.  ABG with pH of 7.4, CO2 of 37, O2 of 47, bicarb of 23.  CBC pending at time of admission. Sodium of 142, K of 4.8, BG of 223.  Chest x-ray showing new right upper lobe infiltrate opacity.  Negative flu and COVID PCR.  Negative UA.  Per daughter, patient has been tube feed only and is still awaiting speech evaluation to start p.o.  Patient was placed on IV vancomycin, cefepime and Flagyl.  Hospitalist on call for admission.   Review of Systems: unable to review all systems  due to the inability of the patient to answer questions. 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 No pertinent surgical history. Social History:  reports that she does not currently use alcohol. She reports that she does not use drugs. No history on file for tobacco use.  Allergies  Allergen Reactions   Eggs Or Egg-Derived Products     History reviewed. No pertinent family history.  Prior to Admission medications   Medication Sig Start Date End Date Taking? Authorizing Provider  acetaminophen (TYLENOL) 500 MG tablet Place 1 tablet (500 mg total) into feeding tube every 4 (four) hours as needed for mild pain. 05/02/21  Yes Derrel Nip, MD  amLODipine (NORVASC) 10 MG tablet Place 1 tablet (10 mg total) into feeding tube daily. 05/02/21  Yes Derrel Nip, MD  apixaban (ELIQUIS) 5 MG TABS tablet Place 1 tablet (5 mg total) into feeding tube 2 (two) times daily. 05/02/21  Yes Derrel Nip, MD  aspirin 81 MG chewable tablet Place 1 tablet (81 mg total) into feeding tube daily. 05/03/21  Yes Derrel Nip, MD  atorvastatin (LIPITOR)  80 MG tablet Place 1 tablet (80 mg total) into feeding tube daily. 05/03/21  Yes Gifford Shave, MD  carvedilol (COREG) 25 MG tablet Place 1 tablet (25 mg total) into feeding tube 2 (two) times daily. 05/02/21  Yes Gifford Shave, MD  FERROUS SULFATE PO 7.5 mLs by Gastric Tube route 2 (two) times daily.   Yes [provider]  Multiple Vitamins-Minerals (MULTIVITAMIN WITH MINERALS) tablet Place 1 tablet into feeding tube daily. 05/02/21  Yes Gifford Shave, MD  Nutritional Supplements (FEEDING SUPPLEMENT,  OSMOLITE 1.2 CAL,) LIQD Place 1,000 mLs into feeding tube continuous. 05/02/21  Yes Gifford Shave, MD  Nutritional Supplements (FEEDING SUPPLEMENT, PROSOURCE TF,) liquid Place 45 mLs into feeding tube daily. 05/03/21  Yes Gifford Shave, MD  pantoprazole (PROTONIX) 40 MG tablet Take 40 mg by mouth daily. 05/21/21  Yes [provider]  polyethylene glycol (MIRALAX / GLYCOLAX) 17 g packet Place 17 g into feeding tube daily as needed for moderate constipation or severe constipation. Patient taking differently: Place 17 g into feeding tube daily. 05/02/21  Yes Gifford Shave, MD  senna-docusate (SENOKOT-S) 8.6-50 MG tablet Place 1 tablet into feeding tube at bedtime as needed for mild constipation. Patient taking differently: Place 1 tablet into feeding tube daily as needed for mild constipation. 05/02/21  Yes Gifford Shave, MD  Zinc Oxide 40 % PSTE Apply 1 application topically in the morning and at bedtime. Apply to buttocks every day and night shift   Yes [provider]    Physical Exam: Vitals:   07/07/21 2315 07/07/21 2330 07/08/21 0000 07/08/21 0015  BP: (!) 132/59 132/65 111/68 106/60  Pulse: 73 83 83 65  Resp: 18 (!) 22 (!) 22 15  Temp:      TempSrc:      SpO2: 95% 91% 96% 100%  Weight:      Height:       Constitutional: Ill-appearing thin cachectic female mouth breathing with nonrebreather Eyes: Blindness with no visible iris or pupil ENMT: Mucous membranes are dry with cracking lips and dry blood Neck: normal, supple,  Respiratory: Coarse breath sounds throughout while on 15 L nonrebreather with mild tachypnea.  Breathing through her mouth.  No accessory muscle use.  Cardiovascular: Regular rate and rhythm, no murmurs / rubs / gallops. No extremity edema.  Abdomen: No grimacing with palpation of the abdomen.  PEG tube in place with no surrounding skin breakdown or erythema.   Musculoskeletal: no clubbing / cyanosis. No joint deformity upper and lower  extremities.  Patient withdraws extremities to noxious stimuli. Skin: no rashes, lesions, ulcers.  Neurologic: Patient is obtunded but withdrawals extremities to painful stimuli.  Not able to follow commands.  Nonverbal.   Psychiatric: Unable to assess since patient is obtunded  Data Reviewed:  CXR with new infiltrate   Assessment and Plan:  Acute hypoxic respiratory failure secondary to aspiration pneumonia -CXR shows new RUL infiltrate. Given how ill appearing she is, will continue broad spectrum antibiotic with IV vancomycin, cefepime and Flagyl -Currently requiring 15 L nonrebreather.  Discussed CODE STATUS with daughter, patient is a DNR and would only want intubation in order to let family visit to say "see you later" -Daughter also would like to speak with hospice and would like to be contacted by phone since she cannot be here in person.  Consult placed for palliative care.  Chronic debility from recurrent CVA -pt not following commands but able to withdraw all extremities to painful stimuli.  However cannot necessarily rule  out recurrent CVA but she is unstable from respiratory standpoint for CT scan at this time  Dysphagia as late effect of CVA -Nutrition consulted for PEG tube feed.  Currently undergoing outpatient speech evaluation and is not on oral diet -continuous IV fluids overnight  CAD s/p CABG - Stable  Chronic atrial fibrillation - Continue Coreg, Eliquis   Goals of care -DNR per daughter but would want intubation only for family to say goodbye      Advance Care Planning:   Code Status: Partial Code DNR per daughter but would want intubation only for family to say goodbye   Consults: None  Family Communication: Discussed with daughter over the phone that patient is ill and has poor prognosis.  All questions and concerns were addressed.  Severity of Illness: The appropriate patient status for this patient is INPATIENT. Inpatient status is judged to be  reasonable and necessary in order to provide the required intensity of service to ensure the patient's safety. The patient's presenting symptoms, physical exam findings, and initial radiographic and laboratory data in the context of their chronic comorbidities is felt to place them at high risk for further clinical deterioration. Furthermore, it is not anticipated that the patient will be medically stable for discharge from the hospital within 2 midnights of admission.   * I certify that at the point of admission it is my clinical judgment that the patient will require inpatient hospital care spanning beyond 2 midnights from the point of admission due to high intensity of service, high risk for further deterioration and high frequency of surveillance required.*  Author: Orene Desanctis, DO 07/08/2021 12:29 AM  For on call review [email or web address].

## 2021-07-08 NOTE — Discharge Planning (Signed)
RNCM consulted regarding Residential Hospice Facility no more than 50 miles from [place], [place].  RNCM reached out to [facility] Agencies in that area to find that [facility] is the closest.  Family opted for [facility].

## 2021-07-08 NOTE — ED Notes (Signed)
Transport arrived for pt.   

## 2021-07-08 NOTE — ED Notes (Signed)
Tachypnea, gurgling respirations noted at this time. Pt is non verbal, mittens on bilat hands. O2 sat 75% at this time

## 2021-07-08 NOTE — ED Provider Notes (Signed)
I was called to evaluate the patient at approximately 6 AM.  Patient was holding for admission.  I was informed that patient had abnormal labs with hemoglobin less than 7, and was hypotensive.  On my evaluation she was lying in the bed in no acute distress.  Patient is nonverbal at baseline.  No obvious signs of blood loss.  Rectal performed without any signs of blood or melena, but Hemoccult positive Due to abrupt change in hemoglobin, 1 unit of packed red cells has been ordered.  She had this happen previously after an admission and received blood products   Zadie Rhine, MD 07/08/21 [phone]

## 2021-07-08 NOTE — Assessment & Plan Note (Addendum)
Chronic debility from recurrent CVA - transition to comfort care

## 2021-07-08 NOTE — Progress Notes (Signed)
Pt comfort care. Orders to not increase Fio2 or check sats. Pt currently on 5L [place]. Deep suctioned back of throat for rhonchi/congestion.

## 2021-07-08 NOTE — Assessment & Plan Note (Addendum)
-  palliative care consult- - needs transitioned to comfort care

## 2021-07-08 NOTE — Assessment & Plan Note (Addendum)
-  has gotten 1 unit PRBCs now comfort care focused

## 2021-07-09 ENCOUNTER — Encounter (HOSPITAL_COMMUNITY): Payer: Self-pay | Admitting: Internal Medicine

## 2021-07-09 LAB — TYPE AND SCREEN
ABO/RH(D): O POS
Antibody Screen: NEGATIVE
Unit division: 0

## 2021-07-09 LAB — BPAM RBC
Blood Product Expiration Date: 202303082359
ISSUE DATE / TIME: 202302090842
Unit Type and Rh: 5100

## 2021-07-09 MED ORDER — LIP MEDEX EX OINT
TOPICAL_OINTMENT | CUTANEOUS | Status: DC | PRN
  Filled 2021-07-09: qty 7

## 2021-07-09 NOTE — Progress Notes (Signed)
°  Progress Note   Patient: Jill Buchanan [id number] DOB: 1960/10/06 DOA: 07/07/2021     2 DOS: the patient was seen and examined on 07/09/2021   Brief hospital course: Jill Buchanan is a 61 y.o. female with medical history significant bilateral blindness,  CVA with chronic debility including dysphagia on PEG, chronic atrial fibrillation on Eliquis,CAD s/p CABG who presents from SNF with increasing shortness of breath and hypoxia.  Reportedly patient was hypoxic down to 73% per EMS report.  She was placed on nonrebreather requiring suction.  Patient is obtunded, nonverbal and not responsive to commands at this time. Transitioned to comfort care  Assessment and Plan: * Acute respiratory failure with hypoxia ([place])- (present on admission) Acute hypoxic respiratory failure secondary to aspiration pneumonia -transition to comfort care- suspect an in hospital death  Goals of care, counseling/discussion -palliative care consult- - needs transitioned to comfort care  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 (CVA) Chronic debility from recurrent CVA - transition to comfort care  Atrial fibrillation, chronic ([place])- (present on admission) -comfort care focused  Anemia- (present on admission) -has gotten 1 unit PRBCs now comfort care focused   Recurrent cerebrovascular accidents (CVAs) ([place])- (present on admission) -see above        Subjective: transitioned to comfort care  Physical Exam: Vitals:   07/08/21 2000 07/08/21 2009 07/08/21 2225 07/08/21 2305  BP: (!) 133/59 (!) 133/59 125/62 (!) 127/57  Pulse: 80 80 64 71  Resp: (!) 35 (!) 35 19 20  Temp: 100.2 F (37.9 C)  (!) 100.6 F (38.1 C) 98.9 F (37.2 C)  TempSrc:    Axillary  SpO2: (!) 75% (!) 80% 99% 97%  Weight:      Height:       Appears comfortable in bed   Disposition: Status is: Inpatient Remains inpatient appropriate because: in hospital death        Time spent: 45  minutes  Author: Geradine Girt, DO 07/09/2021 11:42 AM  For on call review [email or web address].

## 2021-07-09 NOTE — Plan of Care (Signed)
Pt admitted for Acute respiratory failure with hypoxia BP (!) 127/57 (BP Location: Left Arm)    Pulse 71    Temp 98.9 F (37.2 C) (Axillary)    Resp 20    Ht 5\' 3"  (1.6 m)    Wt 52.4 kg    LMP  (LMP Unknown)    SpO2 97%    BMI 20.46 kg/m  Pt alert to self, withdraws with pain. Pt suctioned with copious amounts of sputum suctioned out. Pt appeared comfortable and arranged in bed for comfort. Bed in lowest position, 3 of 4 rails up with wheels locked and call bell near patient. No needs able to be expressed at this time.  07/09/21 3:45 AM

## 2021-07-09 NOTE — Progress Notes (Signed)
Daily Progress Note   Patient Name: Jill Buchanan       Date: 07/09/2021 DOB: 24-Feb-1961  Age: 61 y.o. MRN#: [id number] Attending Physician: Geradine Girt, DO Primary Care Physician: Pcp, No Admit Date: 07/07/2021  Reason for Consultation/Follow-up: end of life care, symptom management  HPI/Patient Profile: 61 y.o. female  with past medical history of CVA with chronic debility including dysphagia with PEG, chronic atrial fibrillation on Eliquis, CAD s/p CABG, and blindness. She presented to the emergency department from SNF on 07/07/2021 with shortness of breath and hypoxia. Per EMS report, patient was hypoxic to 73% and placed on NRB.  In the ED, chest x-ray showed new right upper lobe infiltrate. Negative flu and COVID. UA unremarkable.  Admitted to Hospitalist team with acute hypoxic respiratory failure secondary to aspiration pneumonia.   Subjective: Patient appears comfortable. She is unresponsive to voice and light touch. No non-verbal signs of pain or discomfort noted. Respirations are even and unlabored. No excessive respiratory secretions noted.   No family present at bedside. I spoke with daughter Jill Buchanan by phone. Provided reassurance that her mother appears much more comfortable compared to yesterday. Discussed that she is receiving scheduled IV morphine every 4 hours. Education provided on natural trajectory at EOL. Encouraged Jill Buchanan to communicate to the rest of the family that anyone who wants to visit should do so sooner rather than later. Emotional support provided.   Discussed the option to transfer to a residential hospice facility for a more peaceful setting at EOL. Jill Buchanan is agreeable to this and would be prefer a facility that is closer to patient's sister (in [place]) but  also within 30 miles of the funeral home she has selected (located on [address] in [place]).   Objective:  Physical Exam Vitals reviewed.  Constitutional:      General: She is not in acute distress.    Appearance: She is ill-appearing.  Pulmonary:     Effort: Pulmonary effort is normal.  Neurological:     Mental Status: She is unresponsive.            Vital Signs: BP (!) 127/57 (BP Location: Left Arm)    Pulse 71    Temp 98.9 F (37.2 C) (Axillary)    Resp 20    Ht 5\' 3"  (1.6 m)  Wt 52.4 kg    LMP  (LMP Unknown)    SpO2 97%    BMI 20.46 kg/m  SpO2: SpO2: 97 % O2 Device: O2 Device: Nasal Cannula   LBM: Last BM Date:  (PTA) Baseline Weight: Weight: 52.4 kg Most recent weight: Weight: 52.4 kg       Palliative Assessment/Data: PPS 10%       Palliative Care Assessment & Plan    Assessment: - acute hypoxic respiratory failure - aspiration pneumonia - recurrent CVA - chronic debility - dysphagia - end of life care  Recommendations/Plan: Continue comfort care Continue scheduled morphine 1-2 mg every 4 hours Transfer to hospice facility in [facility] when bed is available PRN medications are available for symptom management at EOL (see below)  Symptom Management:  Morphine prn for uncontrolled pain or dyspnea Lorazepam (ATIVAN) prn for anxiety Haloperidol (HALDOL) prn for agitation  Glycopyrrolate (ROBINUL) for excessive secretions Ondansetron (ZOFRAN) prn for nausea Polyvinyl alcohol (LIQUIFILM TEARS) prn for dry eyes Antiseptic oral rinse (BIOTENE) prn for dry mouth  Code Status: DNR/DNI   Prognosis: Likely days  Discharge Planning: Hospice facility   Thank you for allowing the Palliative Medicine Team to assist in the care of this patient.  MDM - High due to: 1 or more chronic illnesses with severe exacerbation, progression, or side effects of treatment OR acute or chronic illness or injury that poses a threat to life or bodily  function Review of prior external notes, review of test results, assessment requiring an independent historian Decision not to resuscitate or to de-escalate care because poor prognosis Parenteral controlled substances  Lavena Bullion, NP  Please contact Palliative Medicine Team phone at [phone] for questions and concerns.

## 2021-07-10 MED ORDER — MORPHINE SULFATE (PF) 2 MG/ML IV SOLN: 1.0000 mg | INTRAVENOUS | 0 refills | Status: AC | PRN

## 2021-07-10 MED ORDER — LORAZEPAM 2 MG/ML PO CONC: 1.0000 mg | ORAL | 0 refills | Status: AC | PRN

## 2021-07-10 MED ORDER — GLYCOPYRROLATE 0.2 MG/ML IJ SOLN: 0.2000 mg | INTRAMUSCULAR | Status: AC | PRN

## 2021-07-10 MED ORDER — HALOPERIDOL LACTATE 2 MG/ML PO CONC: 0.5000 mg | ORAL | 0 refills | Status: AC | PRN

## 2021-07-10 NOTE — TOC Transition Note (Addendum)
Transition of Care [facility]) - CM/SW Discharge Note   Patient Details  Name: Jill Buchanan MRN: [id number] Date of Birth: Apr 04, 1961  [facility] CM/SW Contact:  Bartholomew Crews, RN Phone Number: [phone] 07/10/2021, 10:44 AM   Clinical Narrative:     Received call from [name] at [facility]. Patient has been approved for inpatient hospice and bed is available. [name] is requesting transport be set up for 2pm. PTAR arranged. Medical transport paperwork sent to printer at nurses station. MD notified and in agreement with transition to hospice facility.   Update: Nursing to call report to [place] at [phone].  Final next level of care: [place] Barriers to Discharge: No Barriers Identified   Patient Goals and CMS Choice        Discharge Placement                       Discharge Plan and Services                                     Social Determinants of Health (SDOH) Interventions     Readmission Risk Interventions No flowsheet data found.

## 2021-07-10 NOTE — Discharge Summary (Signed)
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 *   Hospital Course: Jill GlassmanDeborah Buchanan is a 61 y.o. female with medical history significant bilateral blindness,  CVA with chronic debility including dysphagia on PEG, chronic atrial fibrillation on Eliquis,CAD s/p CABG who presents from SNF with increasing shortness of breath and hypoxia.  Reportedly patient was hypoxic down to 73% per EMS report.  She was placed on nonrebreather requiring suction.  Patient is obtunded, nonverbal and not responsive to commands at this time. Transitioned to comfort care  Assessment and Plan: * Acute respiratory failure with hypoxia (HCC)- (present on admission) Acute hypoxic respiratory failure secondary to aspiration pneumonia -transition to comfort care- suspect an in hospital death  Goals of care, counseling/discussion -palliative care consult- - needs transitioned to comfort care  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 (CVA) Chronic debility from recurrent CVA - transition to comfort care  Atrial fibrillation, chronic (HCC)- (present on admission) -comfort care focused  Anemia- (present on admission) -has gotten 1 unit PRBCs now comfort care focused   Recurrent cerebrovascular accidents (CVAs) (HCC)- (present on admission) -see above          Consultants: palliative care  Disposition:  Hospice care Diet recommendation:  NPO  DISCHARGE MEDICATION: Allergies as of 07/10/2021       Reactions   Eggs Or Egg-derived Products         Medication List     STOP taking these medications    amLODipine 10 MG tablet Commonly known as: NORVASC   apixaban 5 MG Tabs tablet Commonly known as: ELIQUIS   aspirin 81 MG chewable tablet   atorvastatin 80 MG tablet Commonly known as: LIPITOR   carvedilol 25 MG tablet Commonly known as: COREG   feeding supplement (OSMOLITE 1.2 CAL) Liqd   feeding supplement (PROSource TF) liquid   FERROUS SULFATE PO   multivitamin with minerals tablet   pantoprazole 40 MG tablet Commonly known as: PROTONIX   polyethylene glycol 17 g packet Commonly known as: MIRALAX / GLYCOLAX   senna-docusate 8.6-50 MG tablet Commonly known as: Senokot-S   Zinc Oxide 40 % Pste       TAKE these medications    acetaminophen 500 MG tablet Commonly known as: TYLENOL Place 1 tablet (500 mg total) into feeding tube every 4 (four) hours as needed for mild pain.   glycopyrrolate 0.2 MG/ML injection Commonly known as: ROBINUL Inject 1 mL (0.2 mg total) into the vein every 4 (four) hours as needed (excessive secretions).   haloperidol 2 MG/ML solution Commonly known as: HALDOL Place 0.3 mLs (0.6 mg total) under the tongue every 4 (four) hours as needed for agitation (or delirium).   LORazepam 2 MG/ML concentrated solution Commonly known as: ATIVAN Place 0.5 mLs (1 mg total) under the tongue every 4 (four) hours as needed for anxiety.   morphine (PF) 2 MG/ML injection Inject 0.5-1 mLs (1-2 mg total) into  the vein every 2 (two) hours as needed (uncontrolled pain or dyspnea).         Discharge Exam: Filed Weights   07/07/21 2037  Weight: 52.4 kg  Appears comfortable  Condition at discharge:  comfort care  The results of significant diagnostics from this hospitalization (including imaging, microbiology, ancillary and laboratory) are  listed below for reference.   Imaging Studies: DG Skull 1-3 Views  Result Date: 07/01/2021 CLINICAL DATA:  Evaluate for possible foreign body EXAM: SKULL - 1-3 VIEW COMPARISON:  04/27/2021 FINDINGS: No acute bony abnormality is noted. No radiopaque foreign body is seen. Calcified globes are noted bilaterally stable in appearance from prior CT. IMPRESSION: No radiopaque foreign body is noted. Electronically Signed   By: Alcide Clever M.D.   On: 07/01/2021 20:25   MR BRAIN WO CONTRAST  Result Date: 07/02/2021 CLINICAL DATA:  61 year old female with altered mental status. Atrial fibrillation EXAM: MRI HEAD WITHOUT CONTRAST TECHNIQUE: Multiplanar, multiecho pulse sequences of the brain and surrounding structures were obtained without intravenous contrast. COMPARISON:  Brain MRI 04/28/2021. FINDINGS: Brain: Study is mildly degraded by motion artifact despite repeated imaging attempts. Punctate focus of restricted diffusion along the right caudate nucleus, periventricular on series 3, image 32 and series 4, image 21. Underlying extensive bilateral corona radiata and basal ganglia chronic small vessel disease with multiple chronic lacunar infarcts. Mild associated hemosiderin. Subtle chronic lacunar infarcts also suspected in the bilateral thalami greater on the right. And superimposed bilateral MCA territory cortical encephalomalacia, mostly posterior division and stable since November. No other restricted diffusion. Ex vacuo ventricular enlargement. No midline shift, mass effect, evidence of mass lesion, ventriculomegaly, extra-axial collection or acute intracranial hemorrhage. Cervicomedullary junction and pituitary are within normal limits. Vascular: Major intracranial vascular flow voids are stable. Skull and upper cervical spine: Negative. Visualized bone marrow signal is within normal limits. Sinuses/Orbits: Bilateral phthisis bulbi mild to moderate increased bilateral paranasal sinus mucosal thickening. Small  fluid levels in the sphenoid sinuses now. Other: Increased retained secretions in the visible pharynx. Trace mastoid air cell fluid not significantly changed. IMPRESSION: Severe chronic small vessel disease and previous bilateral MCA territory ischemia, with a superimposed punctate acute lacunar infarct in the right caudate nucleus. No acute hemorrhage or mass effect. Electronically Signed   By: Odessa Fleming M.D.   On: 07/02/2021 09:56   DG Chest Portable 1 View  Result Date: 07/07/2021 CLINICAL DATA:  Dyspnea EXAM: PORTABLE CHEST 1 VIEW COMPARISON:  07/01/2021 FINDINGS: The patient is moderately rotated on this examination likely resulting in artifactual right-sided volume loss. There is patchy infiltrate noted within the right upper lobe, possibly infectious or inflammatory in the acute setting. No pneumothorax or pleural effusion. 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 Cardiac size is stable. Central pulmonary vascular congestion persists without overt pulmonary edema. IMPRESSION: Patchy infiltrate within the right upper lobe, possibly infectious or inflammatory in the acute setting. Stable cardiomegaly.  Central pulmonary venous vascular engorgement. Electronically Signed   By: Helyn Numbers M.D.   On: 07/07/2021 20:48   DG CHEST PORT 1 VIEW  Result Date: 07/01/2021 CLINICAL DATA:  MRI clearance. EXAM: PORTABLE CHEST 1 VIEW, abdomen portable. COMPARISON:  04/29/2019 FINDINGS: The heart is enlarged and there is evidence of prior cardiothoracic surgery. 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 Atherosclerotic calcification of the aorta is noted. An opacity is noted at the left costophrenic angle which is unchanged from multiple prior exams. The right lung is clear. No pneumothorax. No acute osseous  abnormality. There is a nonobstructive bowel-gas pattern. A tubular structure with balloon is terminates over the mid left abdomen, possible feeding tube. No radiopaque foreign body is  identified. Suture material is noted in the pelvis. No acute osseous abnormality. IMPRESSION: 1. Stable opacity at the left costophrenic angle, may represent a prominent epicardial fat pad versus atelectasis, infiltrate, and/or effusion. 2. Evidence of prior cardiothoracic surgery with prosthetic cardiac valves. 3. Nonobstructive bowel-gas pattern. 4. Tubular structure with balloon terminating over the mid left abdomen. Correlate clinically for feeding tube. Electronically Signed   By: Thornell Sartorius M.D.   On: 07/01/2021 20:30   DG CHEST PORT 1 VIEW  Result Date: 06/26/2021 CLINICAL DATA:  Increased tracheal secretions EXAM: PORTABLE CHEST 1 VIEW COMPARISON:  Yesterday FINDINGS: New hazy density at the right apex. Extensive opacity on the left with some volume loss, similar. Cardiomegaly. AV valve replacements. There could be a left pleural effusion. No pneumothorax. IMPRESSION: Extensive left and new right upper airspace opacity consistent with worsening pneumonia. Electronically Signed   By: Tiburcio Pea M.D.   On: 06/26/2021 04:40   DG Chest Port 1 View  Result Date: 06/25/2021 CLINICAL DATA:  Shortness of breath and hypotension. EXAM: PORTABLE CHEST 1 VIEW COMPARISON:  Portable chest 04/27/2021. FINDINGS: Again noted are sternotomy sutures with aortic valve replacement. There is mild central vascular prominence but no findings of edema. Mild cardiomegaly. There is dense opacification of lower half of the left chest, and in the upper half of the left lung there is patchy airspace disease. There is a least a small or moderate-sized underlying left pleural effusion. Findings are worrisome for pneumonia, aspiration, and reactive pleural effusion. Stable mediastinum. There is calcification in the aortic arch. Right lung is clear. Osteopenia. IMPRESSION: On the left, since 04/27/2021 there is new opacification in the lower half of the thorax with patchy airspace disease in the left upper lobe. Findings may  suggest pneumonia or aspiration with reactive pleural effusion which could be small to moderate in size. Clinical correlation and radiographic follow-up are recommended. Right lung is clear. Mild cardiomegaly. Electronically Signed   By: Almira Bar M.D.   On: 06/25/2021 04:13   DG Abd Portable 1V  Result Date: 07/01/2021 CLINICAL DATA:  MRI clearance. EXAM: PORTABLE CHEST 1 VIEW, abdomen portable. COMPARISON:  04/29/2019 FINDINGS: The heart is enlarged and there is evidence of prior cardiothoracic surgery. 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 Atherosclerotic calcification of the aorta is noted. An opacity is noted at the left costophrenic angle which is unchanged from multiple prior exams. The right lung is clear. No pneumothorax. No acute osseous abnormality. There is a nonobstructive bowel-gas pattern. A tubular structure with balloon is terminates over the mid left abdomen, possible feeding tube. No radiopaque foreign body is identified. Suture material is noted in the pelvis. No acute osseous abnormality. IMPRESSION: 1. Stable opacity at the left costophrenic angle, may represent a prominent epicardial fat pad versus atelectasis, infiltrate, and/or effusion. 2. Evidence of prior cardiothoracic surgery with prosthetic cardiac valves. 3. Nonobstructive bowel-gas pattern. 4. Tubular structure with balloon terminating over the mid left abdomen. Correlate clinically for feeding tube. Electronically Signed   By: Thornell Sartorius M.D.   On: 07/01/2021 20:30   EEG adult  Result Date: 07/01/2021 Charlsie Quest, MD     07/01/2021  3:28 PM Patient Name: Fariha Goto MRN: [id number] Epilepsy Attending: Charlsie Quest Referring Physician/Provider: Leatha Gilding Date: 07/01/2021 Duration: 21.45 mins  Patient history: 62 year old female  with prior strokes with altered mental status.  EEG to evaluate for seizures.  Level of alertness: Awake  AEDs during EEG study: None  Technical aspects: This EEG study  was done with scalp electrodes positioned according to the 10-20 International system of electrode placement. Electrical activity was acquired at a sampling rate of 500Hz  and reviewed with a high frequency filter of 70Hz  and a low frequency filter of 1Hz . EEG data were recorded continuously and digitally stored.  Description: No clear posterior dominant rhythm was seen. EEG showed continuous generalized 3 to 5 Hz theta-delta slowing. Hyperventilation and photic stimulation were not performed.    ABNORMALITY - Continuous slow, generalized  IMPRESSION: This study is suggestive of moderate diffuse encephalopathy, nonspecific etiology.  No seizures or definite epileptiform discharges were seen throughout the recording.     EEG adult  Result Date: 06/25/2021 , MD     06/25/2021 12:07 PM Patient Name: Adelee Hannula MRN: Charlsie Quest Epilepsy Attending: 06/27/2021 Referring Physician/Provider: Dorothea Glassman, MD Date: 06/25/2021 Duration: 21.53 mins Patient history: 61 year old female with prior strokes who presented with altered mental status and tongue trauma.  EEG to evaluate for seizures. Level of alertness: Awake AEDs during EEG study: None Technical aspects: This EEG study was done with scalp electrodes positioned according to the 10-20 International system of electrode placement. Electrical activity was acquired at a sampling rate of 500Hz  and reviewed with a high frequency filter of 70Hz  and a low frequency filter of 1Hz . EEG data were recorded continuously and digitally stored. Description: No clear posterior dominant rhythm was seen. EEG showed continuous generalized 3 to 5 Hz theta-delta slowing. Generalized periodic discharges with triphasic morphology were also noted at fluctuating frequency of 1 to 2.5 Hz.  Hyperventilation and photic stimulation were not performed.   ABNORMALITY - Periodic discharges with triphasic morphology, generalized ( GPDs) - Continuous slow,  generalized IMPRESSION: This study showed generalized periodic discharges with triphasic morphology which is on the ictal-interictal continuum with low to intermediate potential for seizures. Given the morphology and frequency of discharges, this EEG pattern can also be seen due to toxic-metabolic causes like cefepime toxicity, hyperammonemia, hyperuremia. Additionally, EEG suggestive of moderate diffuse encephalopathy, nonspecific etiology.  No seizures were seen throughout the recording. Jonah Blue    Microbiology: Results for orders placed or performed during the hospital encounter of 07/07/21  Culture, blood (routine x 2)     Status: None (Preliminary result)   Collection Time: 07/07/21  8:41 PM   Specimen: BLOOD RIGHT HAND  Result Value Ref Range Status   Specimen Description BLOOD RIGHT HAND  Final   Special Requests   Final    BOTTLES DRAWN AEROBIC AND ANAEROBIC Blood Culture adequate volume   Culture   Final    NO GROWTH 3 DAYS Performed at [facility], [address], [place], Charlsie Quest 09/04/21    Report Status PENDING  Incomplete  Culture, blood (routine x 2)     Status: None (Preliminary result)   Collection Time: 07/07/21  9:31 PM   Specimen: BLOOD  Result Value Ref Range Status   Specimen Description BLOOD SITE NOT SPECIFIED  Final   Special Requests   Final    BOTTLES DRAWN AEROBIC AND ANAEROBIC Blood Culture adequate volume   Culture   Final    NO GROWTH 3 DAYS Performed at [facility], [address] [place], [place] [address]    Report Status PENDING  Incomplete  Resp Panel by RT-PCR (Flu A&B, Covid) Nasopharyngeal Swab     Status: None   Collection Time: 07/07/21 10:37 PM   Specimen: Nasopharyngeal Swab; Nasopharyngeal(NP) swabs in vial transport medium  Result Value Ref Range Status   SARS Coronavirus 2 by RT PCR NEGATIVE NEGATIVE Final    Comment: (NOTE) SARS-CoV-2 target nucleic acids are NOT DETECTED.  The SARS-CoV-2 RNA is generally  detectable in upper respiratory specimens during the acute phase of infection. The lowest concentration of SARS-CoV-2 viral copies this assay can detect is 138 copies/mL. A negative result does not preclude SARS-Cov-2 infection and should not be used as the sole basis for treatment or other patient management decisions. A negative result may occur with  improper specimen collection/handling, submission of specimen other than nasopharyngeal swab, presence of viral mutation(s) within the areas targeted by this assay, and inadequate number of viral copies(<138 copies/mL). A negative result must be combined with clinical observations, patient history, and epidemiological information. The expected result is Negative.  Fact Sheet for Patients:  BloggerCourse.com  Fact Sheet for Healthcare Providers:  SeriousBroker.it  This test is no t yet approved or cleared by the [place] FDA and  has been authorized for detection and/or diagnosis of SARS-CoV-2 by FDA under an Emergency Use Authorization (EUA). This EUA will remain  in effect (meaning this test can be used) for the duration of the COVID-19 declaration under Section 564(b)(1) of the Act, 21 U.S.C.section 360bbb-3(b)(1), unless the authorization is terminated  or revoked sooner.       Influenza A by PCR NEGATIVE NEGATIVE Final   Influenza B by PCR NEGATIVE NEGATIVE Final    Comment: (NOTE) The Xpert Xpress SARS-CoV-2/FLU/RSV plus assay is intended as an aid in the diagnosis of influenza from Nasopharyngeal swab specimens and should not be used as a sole basis for treatment. Nasal washings and aspirates are unacceptable for Xpert Xpress SARS-CoV-2/FLU/RSV testing.  Fact Sheet for Patients: BloggerCourse.com  Fact Sheet for Healthcare Providers: SeriousBroker.it  This test is not yet approved or cleared by the [place] FDA  and has been authorized for detection and/or diagnosis of SARS-CoV-2 by FDA under an Emergency Use Authorization (EUA). This EUA will remain in effect (meaning this test can be used) for the duration of the COVID-19 declaration under Section 564(b)(1) of the Act, 21 U.S.C. section 360bbb-3(b)(1), unless the authorization is terminated or revoked.  Performed at [facility], [address] [place], [place] [address]   MRSA Next Gen by PCR, Nasal     Status: None   Collection Time: 07/07/21 10:43 PM   Specimen: Nasal Mucosa; Nasal Swab  Result Value Ref Range Status   MRSA by PCR Next Gen NOT DETECTED NOT DETECTED Final    Comment: (NOTE) The GeneXpert MRSA Assay (FDA approved for NASAL specimens only), is one component of a comprehensive MRSA colonization surveillance program. It is not intended to diagnose MRSA infection nor to guide or monitor treatment for MRSA infections. Test performance is not FDA approved in patients less than 4 years old. Performed at [facility], [address], [place], [place] [address]     Labs: CBC: Recent Labs  Lab 07/07/21 2054 07/07/21 2056 07/08/21 0447 07/08/21 1528  WBC  --   --  17.4*  --   HGB 10.2* 9.2* 6.8* 9.1*  HCT 30.0* 27.0* 22.6* 28.5*  MCV  --   --  89.7  --   PLT  --   --  318  --    Basic Metabolic Panel: Recent Labs  Lab 07/07/21 2054 07/07/21 2056 07/08/21 0447  NA 143 142 144  K 3.8 4.8 3.8  CL  --  111 114*  CO2  --   --  20*  GLUCOSE  --  236* 109*  BUN  --  57* 42*  CREATININE  --  0.80 0.77  CALCIUM  --   --  9.1   Liver Function Tests: No results for input(s): AST, ALT, ALKPHOS, BILITOT, PROT, ALBUMIN in the last 168 hours. CBG: Recent Labs  Lab 07/03/21 1142 07/03/21 1654  GLUCAP 121* 92    Discharge time spent: greater than 30 minutes.  Signed: Joseph ArtJessica U Sharnee Douglass, DO [facility] 07/10/2021

## 2021-07-10 NOTE — Progress Notes (Signed)
Report given to [facility].

## 2021-07-10 NOTE — Progress Notes (Signed)
Daily Progress Note   Patient Name: Jill Buchanan       Date: 07/10/2021 DOB: 06-15-1960  Age: 61 y.o. MRN#: [id number] Attending Physician: Geradine Girt, DO Primary Care Physician: Pcp, No Admit Date: 07/07/2021   Reason for Consultation/Follow-up: end of life care, symptom management   HPI/Patient Profile: 61 y.o. female  with past medical history of CVA with chronic debility including dysphagia with PEG, chronic atrial fibrillation on Eliquis, CAD s/p CABG, and blindness. She presented to the emergency department from SNF on 07/07/2021 with shortness of breath and hypoxia. Per EMS report, patient was hypoxic to 73% and placed on NRB.  In the ED, chest x-ray showed new right upper lobe infiltrate. Negative flu and COVID. UA unremarkable.  Admitted to Hospitalist team with acute hypoxic respiratory failure secondary to aspiration pneumonia.    Subjective: Patient appears comfortable. She is unresponsive to voice and light touch. No non-verbal signs of pain or discomfort noted. Respirations are even and unlabored. No excessive respiratory secretions noted. Oxygen decreased from 4L to 2L.    Objective:  Physical Exam Vitals reviewed.  Constitutional:      General: She is not in acute distress.    Appearance: She is ill-appearing.  Pulmonary:     Effort: Pulmonary effort is normal.  Neurological:     Mental Status: She is unresponsive.            Vital Signs: BP (!) 127/57 (BP Location: Left Arm)    Pulse 71    Temp 98.9 F (37.2 C) (Axillary)    Resp 20    Ht 5\' 3"  (1.6 m)    Wt 52.4 kg    LMP  (LMP Unknown)    SpO2 97%    BMI 20.46 kg/m  SpO2: SpO2: 97 % O2 Device: O2 Device: Nasal Cannula   Intake/output summary:  Intake/Output Summary (Last 24 hours) at 07/10/2021  1156 Last data filed at 07/10/2021 0500 Gross per 24 hour  Intake 0 ml  Output 175 ml  Net -175 ml   LBM: Last BM Date:  (PTA) Baseline Weight: Weight: 52.4 kg Most recent weight: Weight: 52.4 kg       Palliative Assessment/Data: PPS 10%      Palliative Care Assessment & Plan   Assessment: - acute hypoxic respiratory failure - aspiration  pneumonia - recurrent CVA - chronic debility - dysphagia - end of life care   Recommendations/Plan: Continue comfort care Continue scheduled morphine 1-2 mg every 4 hours Transfer to hospice facility in [facility] PRN medications are available for symptom management at EOL (see below)   Symptom Management:  Morphine prn for uncontrolled pain or dyspnea Lorazepam (ATIVAN) prn for anxiety Haloperidol (HALDOL) prn for agitation  Glycopyrrolate (ROBINUL) for excessive secretions Ondansetron (ZOFRAN) prn for nausea Polyvinyl alcohol (LIQUIFILM TEARS) prn for dry eyes Antiseptic oral rinse (BIOTENE) prn for dry mouth  Code Status: DNR/DNI  Prognosis:  < 2 weeks  Discharge Planning: Hospice facility   Thank you for allowing the Palliative Medicine Team to assist in the care of this patient.   MDM - Moderate   Lavena Bullion, NP  Please contact Palliative Medicine Team phone at [phone] for questions and concerns.

## 2021-07-12 LAB — CULTURE, BLOOD (ROUTINE X 2)
Culture: NO GROWTH
Culture: NO GROWTH
Special Requests: ADEQUATE
Special Requests: ADEQUATE
